# Patient Record
Sex: Female | Born: 1995 | Race: White | Hispanic: No | Marital: Married | Smoking: Never smoker
Health system: Southern US, Community
[De-identification: ages and names within clinical notes are randomized; demographics above are authoritative.]

## PROBLEM LIST (undated history)

## (undated) DIAGNOSIS — R519 Headache, unspecified: Secondary | ICD-10-CM

## (undated) DIAGNOSIS — R51 Headache: Secondary | ICD-10-CM

## (undated) DIAGNOSIS — E079 Disorder of thyroid, unspecified: Secondary | ICD-10-CM

## (undated) HISTORY — DX: Headache, unspecified: R51.9

## (undated) HISTORY — DX: Headache: R51

## (undated) HISTORY — PX: NO PAST SURGERIES: SHX2092

## (undated) HISTORY — DX: Disorder of thyroid, unspecified: E07.9

---

## 2015-01-19 ENCOUNTER — Encounter (HOSPITAL_COMMUNITY): Payer: Self-pay | Admitting: *Deleted

## 2015-01-19 ENCOUNTER — Inpatient Hospital Stay (HOSPITAL_COMMUNITY): Payer: 59

## 2015-01-19 ENCOUNTER — Inpatient Hospital Stay (HOSPITAL_COMMUNITY)
Admission: AD | Admit: 2015-01-19 | Discharge: 2015-01-19 | Disposition: A | Discharge status: Home / Self Care | Payer: 59 | Source: Ambulatory Visit | Attending: Obstetrics and Gynecology | Admitting: Obstetrics and Gynecology

## 2015-01-19 DIAGNOSIS — O209 Hemorrhage in early pregnancy, unspecified: Secondary | ICD-10-CM | POA: Diagnosis present

## 2015-01-19 DIAGNOSIS — Z3A01 Less than 8 weeks gestation of pregnancy: Secondary | ICD-10-CM | POA: Insufficient documentation

## 2015-01-19 DIAGNOSIS — O3680X0 Pregnancy with inconclusive fetal viability, not applicable or unspecified: Secondary | ICD-10-CM

## 2015-01-19 LAB — CBC
HCT: 37.5 % (ref 36.0–46.0)
Hemoglobin: 12.6 g/dL (ref 12.0–15.0)
MCH: 29.7 pg (ref 26.0–34.0)
MCHC: 33.6 g/dL (ref 30.0–36.0)
MCV: 88.4 fL (ref 78.0–100.0)
PLATELETS: 230 10*3/uL (ref 150–400)
RBC: 4.24 MIL/uL (ref 3.87–5.11)
RDW: 13.4 % (ref 11.5–15.5)
WBC: 7.3 10*3/uL (ref 4.0–10.5)

## 2015-01-19 LAB — URINALYSIS, ROUTINE W REFLEX MICROSCOPIC
Bilirubin Urine: NEGATIVE
GLUCOSE, UA: NEGATIVE mg/dL
KETONES UR: NEGATIVE mg/dL
Leukocytes, UA: NEGATIVE
Nitrite: NEGATIVE
PROTEIN: NEGATIVE mg/dL
Specific Gravity, Urine: >1.03 — ABNORMAL HIGH (ref 1.005–1.030)
UROBILINOGEN UA: 0.2 mg/dL (ref 0.0–1.0)
pH: 5.5 (ref 5.0–8.0)

## 2015-01-19 LAB — URINE MICROSCOPIC-ADD ON

## 2015-01-19 LAB — POCT PREGNANCY, URINE: PREG TEST UR: POSITIVE — AB

## 2015-01-19 LAB — WET PREP, GENITAL
Clue Cells Wet Prep HPF POC: NONE SEEN
Trich, Wet Prep: NONE SEEN
Yeast Wet Prep HPF POC: NONE SEEN

## 2015-01-19 LAB — HCG, QUANTITATIVE, PREGNANCY: hCG, Beta Chain, Quant, S: 2503 m[IU]/mL — ABNORMAL HIGH (ref <5)

## 2015-01-19 NOTE — Discharge Instructions (Signed)
Vaginal Bleeding During Pregnancy, First Trimester  A small amount of bleeding (spotting) from the vagina is relatively common in early pregnancy. It usually stops on its own. Various things may cause bleeding or spotting in early pregnancy. Some bleeding may be related to the pregnancy, and some may not. In most cases, the bleeding is normal and is not a problem. However, bleeding can also be a sign of something serious. Be sure to tell your health care provider about any vaginal bleeding right away.  Some possible causes of vaginal bleeding during the first trimester include:  · Infection or inflammation of the cervix.  · Growths (polyps) on the cervix.  · Miscarriage or threatened miscarriage.  · Pregnancy tissue has developed outside of the uterus and in a fallopian tube (tubal pregnancy).  · Tiny cysts have developed in the uterus instead of pregnancy tissue (molar pregnancy).  HOME CARE INSTRUCTIONS   Watch your condition for any changes. The following actions may help to lessen any discomfort you are feeling:  · Follow your health care provider's instructions for limiting your activity. If your health care provider orders bed rest, you may need to stay in bed and only get up to use the bathroom. However, your health care provider may allow you to continue light activity.  · If needed, make plans for someone to help with your regular activities and responsibilities while you are on bed rest.  · Keep track of the number of pads you use each day, how often you change pads, and how soaked (saturated) they are. Write this down.  · Do not use tampons. Do not douche.  · Do not have sexual intercourse or orgasms until approved by your health care provider.  · If you pass any tissue from your vagina, save the tissue so you can show it to your health care provider.  · Only take over-the-counter or prescription medicines as directed by your health care provider.  · Do not take aspirin because it can make you  bleed.  · Keep all follow-up appointments as directed by your health care provider.  SEEK MEDICAL CARE IF:  · You have any vaginal bleeding during any part of your pregnancy.  · You have cramps or labor pains.  · You have a fever, not controlled by medicine.  SEEK IMMEDIATE MEDICAL CARE IF:   · You have severe cramps in your back or belly (abdomen).  · You pass large clots or tissue from your vagina.  · Your bleeding increases.  · You feel light-headed or weak, or you have fainting episodes.  · You have chills.  · You are leaking fluid or have a gush of fluid from your vagina.  · You pass out while having a bowel movement.  MAKE SURE YOU:  · Understand these instructions.  · Will watch your condition.  · Will get help right away if you are not doing well or get worse.  Document Released: 06/13/2005 Document Revised: 09/08/2013 Document Reviewed: 05/11/2013  ExitCare® Patient Information ©2015 [employer]. This information is not intended to replace advice given to you by your health care provider. Make sure you discuss any questions you have with your health care provider.

## 2015-01-19 NOTE — MAU Note (Signed)
Pt stateses she has had not problems with this pregnancy.Pt states she had  Some cramping and went to the bathroom and passed a "big clump of blood" pt states she still sees some blood when se wipes.Pt states blood is very faint

## 2015-01-19 NOTE — MAU Provider Note (Signed)
Chief Complaint: Vaginal Bleeding   First Provider Initiated Contact with Patient 01/19/15 1526      SUBJECTIVE HPI: Danielle Morgan is a 19 y.o. G1P0 at 2566w2d by LMP who presents to maternity admissions reporting onset of bright red vaginal bleeding with small clot when wiping today.  Last intercourse was last night.  She denies abdominal pain.  She has not yet had care in this pregnancy.  Patient's last menstrual period was 12/06/2014.  She is sure of this date and has regular periods.  She denies vaginal itching/burning, urinary symptoms, h/a, dizziness, n/v, or fever/chills.     Vaginal Bleeding The patient's primary symptoms include vaginal bleeding. This is a new problem. The current episode started today. The problem occurs intermittently. The problem has been unchanged. The patient is experiencing no pain. She is pregnant. Associated symptoms include abdominal pain. Pertinent negatives include no back pain, chills, constipation, diarrhea, dysuria, fever, frequency, headaches, nausea, urgency or vomiting. The vaginal bleeding is lighter than menses. She has been passing clots. She has not been passing tissue. Nothing aggravates the symptoms. She has tried nothing for the symptoms. No, her partner does not have an STD. Her menstrual history has been regular.    Past Medical History  Diagnosis Date  . Medical history non-contributory    Past Surgical History  Procedure Laterality Date  . No past surgeries     History   Social History  . Marital Status: Single    Spouse Name: N/A  . Number of Children: N/A  . Years of Education: N/A   Occupational History  . Not on file.   Social History Main Topics  . Smoking status: Not on file  . Smokeless tobacco: Not on file  . Alcohol Use: Not on file  . Drug Use: Not on file  . Sexual Activity: Not on file   Other Topics Concern  . Not on file   Social History Narrative  . No narrative on file   No current facility-administered  medications on file prior to encounter.   No current outpatient prescriptions on file prior to encounter.   No Known Allergies  Review of Systems  Constitutional: Negative for fever, chills and malaise/fatigue.  Eyes: Negative for blurred vision.  Respiratory: Negative for cough and shortness of breath.   Cardiovascular: Negative for chest pain.  Gastrointestinal: Positive for abdominal pain. Negative for heartburn, nausea, vomiting, diarrhea and constipation.  Genitourinary: Positive for vaginal bleeding. Negative for dysuria, urgency and frequency.  Musculoskeletal: Negative.  Negative for back pain.  Neurological: Negative for dizziness and headaches.  Psychiatric/Behavioral: Negative for depression.    OBJECTIVE Blood pressure 128/68, pulse 64, temperature 98.2 F (36.8 C), temperature source Oral, resp. rate 16, last menstrual period 12/06/2014. GENERAL: Well-developed, well-nourished female in no acute distress.  EYES: normal sclera/conjunctiva; no lid-lag HENT: Atraumatic, normocephalic HEART: normal rate RESP: normal effort GI: Soft, non-tender MUSCULOSKELETAL: Normal ROM EXTREMITIES: Nontender, no edema NEURO/PSYCH: Alert and oriented, appropriate affect  GU: Cervix pink, visually closed, without lesion, small amount dark red bleeding, vaginal walls and external genitalia normal Bimanual exam: Cervix 0/long/high, firm, anterior, neg CMT, uterus nontender, nonenlarged, adnexa without tenderness, enlargement, or mass   LAB RESULTS Results for orders placed or performed during the hospital encounter of 01/19/15 (from the past 24 hour(s))  Urinalysis, Routine w reflex microscopic     Status: Abnormal   Collection Time: 01/19/15  2:55 PM  Result Value Ref Range   Color, Urine YELLOW YELLOW  APPearance CLEAR CLEAR   Specific Gravity, Urine >1.030 (H) 1.005 - 1.030   pH 5.5 5.0 - 8.0   Glucose, UA NEGATIVE NEGATIVE mg/dL   Hgb urine dipstick LARGE (A) NEGATIVE    Bilirubin Urine NEGATIVE NEGATIVE   Ketones, ur NEGATIVE NEGATIVE mg/dL   Protein, ur NEGATIVE NEGATIVE mg/dL   Urobilinogen, UA 0.2 0.0 - 1.0 mg/dL   Nitrite NEGATIVE NEGATIVE   Leukocytes, UA NEGATIVE NEGATIVE  Urine microscopic-add on     Status: Abnormal   Collection Time: 01/19/15  2:55 PM  Result Value Ref Range   Squamous Epithelial / LPF FEW (A) RARE   WBC, UA 0-2 <3 WBC/hpf   RBC / HPF 3-6 <3 RBC/hpf  Pregnancy, urine POC     Status: Abnormal   Collection Time: 01/19/15  3:06 PM  Result Value Ref Range   Preg Test, Ur POSITIVE (A) NEGATIVE  Wet prep, genital     Status: Abnormal   Collection Time: 01/19/15  3:24 PM  Result Value Ref Range   Yeast Wet Prep HPF POC NONE SEEN NONE SEEN   Trich, Wet Prep NONE SEEN NONE SEEN   Clue Cells Wet Prep HPF POC NONE SEEN NONE SEEN   WBC, Wet Prep HPF POC FEW (A) NONE SEEN  CBC     Status: None   Collection Time: 01/19/15  3:50 PM  Result Value Ref Range   WBC 7.3 4.0 - 10.5 K/uL   RBC 4.24 3.87 - 5.11 MIL/uL   Hemoglobin 12.6 12.0 - 15.0 g/dL   HCT 16.137.5 09.636.0 - 04.546.0 %   MCV 88.4 78.0 - 100.0 fL   MCH 29.7 26.0 - 34.0 pg   MCHC 33.6 30.0 - 36.0 g/dL   RDW 40.913.4 81.111.5 - 91.415.5 %   Platelets 230 150 - 400 K/uL  hCG, quantitative, pregnancy     Status: Abnormal   Collection Time: 01/19/15  3:50 PM  Result Value Ref Range   hCG, Beta Chain, Quant, S 2503 (H) <5 mIU/mL    IMAGING [place] Ob Comp Less 14 Wks  01/19/2015   CLINICAL DATA:  Vaginal bleeding during first trimester pregnancy. Pelvic pain and cramping. Gestational age by LMP of 6 weeks 2 days  EXAM: OBSTETRIC <14 WK US AND TRANSVAGINAL OB US  TECHNIQUE: Both transabdominal and transvaginal ultrasound examinations were performed for complete evaluation of the gestation as well as the maternal uterus, adnexal regions, and pelvic cul-de-sac. Transvaginal technique was performed to assess early pregnancy.  COMPARISON:  None.  FINDINGS: Intrauterine gestational sac: Visualized/normal in  shape.  Yolk sac:  Not visualized  Embryo:  Not visualized  MSD: 6  mm   5 w   1  d  Maternal uterus/adnexae: A second 3 mm cystic focus is also noted within the endometrial cavity superiorly near the fundus, and a second twin intrauterine gestational sac cannot definitely be excluded.  Both ovaries are normal in appearance. No mass or free fluid identified.  IMPRESSION: Probable early 5 week intrauterine gestational sac, but no yolk sac, fetal pole, or cardiac activity yet visualized. Second smaller 3 mm cystic focus in endometrial cavity noted, and an early twin intrauterine gestational sac cannot definitely be excluded. Recommend follow-up quantitative B-HCG levels and follow-up US in 14 days to assess viability and pregnancy number. This recommendation follows SRU consensus guidelines: Diagnostic Criteria for Nonviable Pregnancy Early in the First Trimester. Malva Limes Engl J Med 2013; 782:9562-13; 369:1443-51.  Normal appearance of both ovaries. No mass or free  fluid identified.   Electronically Signed   By: Myles Rosenthal M.D.   On: 01/19/2015 17:49   US Ob Transvaginal  01/19/2015   CLINICAL DATA:  Vaginal bleeding during first trimester pregnancy. Pelvic pain and cramping. Gestational age by LMP of 6 weeks 2 days  EXAM: OBSTETRIC <14 WK [place] AND TRANSVAGINAL OB US  TECHNIQUE: Both transabdominal and transvaginal ultrasound examinations were performed for complete evaluation of the gestation as well as the maternal uterus, adnexal regions, and pelvic cul-de-sac. Transvaginal technique was performed to assess early pregnancy.  COMPARISON:  None.  FINDINGS: Intrauterine gestational sac: Visualized/normal in shape.  Yolk sac:  Not visualized  Embryo:  Not visualized  MSD: 6  mm   5 w   1  d  Maternal uterus/adnexae: A second 3 mm cystic focus is also noted within the endometrial cavity superiorly near the fundus, and a second twin intrauterine gestational sac cannot definitely be excluded.  Both ovaries are normal in appearance. No mass  or free fluid identified.  IMPRESSION: Probable early 5 week intrauterine gestational sac, but no yolk sac, fetal pole, or cardiac activity yet visualized. Second smaller 3 mm cystic focus in endometrial cavity noted, and an early twin intrauterine gestational sac cannot definitely be excluded. Recommend follow-up quantitative B-HCG levels and follow-up US in 14 days to assess viability and pregnancy number. This recommendation follows SRU consensus guidelines: Diagnostic Criteria for Nonviable Pregnancy Early in the First Trimester. Malva Limes Med 2013; 161:0960-45.  Normal appearance of both ovaries. No mass or free fluid identified.   Electronically Signed   By: Myles Rosenthal M.D.   On: 01/19/2015 17:49   MAU MDM Ordered CBC, quant hcg, wet prep, GCC, and OB U/S. Reviewed lab results, images, and U/S report.  Cannot exclude ectopic pregnancy.  ASSESSMENT 1. Pregnancy of unknown anatomic location   2. Vaginal bleeding in pregnancy, first trimester     PLAN Discharge home with ectopic precautions Return to MAU in 48 hours for repeat quant hcg Return sooner as needed for emergencies    Medication List    TAKE these medications        prenatal multivitamin Tabs tablet  Take 1 tablet by mouth at bedtime.       Follow-up Information    Follow up with [facility] [place] [facility]ERN[facility] ADMISSIONS.   Why:  In 48 hours for repeat labs or sooner as needed   Contact information:   [address] [id number] mc [place] [address] [phone]      Sharen Counter Certified Nurse-Midwife 01/19/2015  8:44 PM

## 2015-01-20 ENCOUNTER — Encounter (HOSPITAL_COMMUNITY): Payer: Self-pay

## 2015-01-20 ENCOUNTER — Encounter: Payer: Self-pay | Admitting: *Deleted

## 2015-01-20 ENCOUNTER — Inpatient Hospital Stay (HOSPITAL_COMMUNITY)
Admission: AD | Admit: 2015-01-20 | Discharge: 2015-01-20 | Disposition: A | Discharge status: Home / Self Care | Payer: 59 | Source: Ambulatory Visit | Attending: Obstetrics & Gynecology | Admitting: Obstetrics & Gynecology

## 2015-01-20 DIAGNOSIS — Z3A01 Less than 8 weeks gestation of pregnancy: Secondary | ICD-10-CM | POA: Insufficient documentation

## 2015-01-20 DIAGNOSIS — O209 Hemorrhage in early pregnancy, unspecified: Secondary | ICD-10-CM | POA: Diagnosis not present

## 2015-01-20 DIAGNOSIS — O039 Complete or unspecified spontaneous abortion without complication: Secondary | ICD-10-CM | POA: Diagnosis not present

## 2015-01-20 LAB — CBC
HEMATOCRIT: 36.4 % (ref 36.0–46.0)
Hemoglobin: 12.5 g/dL (ref 12.0–15.0)
MCH: 30.2 pg (ref 26.0–34.0)
MCHC: 34.3 g/dL (ref 30.0–36.0)
MCV: 87.9 fL (ref 78.0–100.0)
Platelets: 243 10*3/uL (ref 150–400)
RBC: 4.14 MIL/uL (ref 3.87–5.11)
RDW: 13.4 % (ref 11.5–15.5)
WBC: 8.9 10*3/uL (ref 4.0–10.5)

## 2015-01-20 LAB — GC/CHLAMYDIA PROBE AMP (~~LOC~~) NOT AT ARMC
Chlamydia: NEGATIVE
NEISSERIA GONORRHEA: NEGATIVE

## 2015-01-20 LAB — HIV ANTIBODY (ROUTINE TESTING W REFLEX): HIV SCREEN 4TH GENERATION: NONREACTIVE

## 2015-01-20 LAB — HCG, QUANTITATIVE, PREGNANCY: hCG, Beta Chain, Quant, S: 1193 m[IU]/mL — ABNORMAL HIGH (ref <5)

## 2015-01-20 MED ORDER — IBUPROFEN 800 MG PO TABS: 800.0000 mg | ORAL_TABLET | Freq: Three times a day (TID) | ORAL | Status: DC | PRN

## 2015-01-20 NOTE — Discharge Instructions (Signed)
Miscarriage A miscarriage is the sudden loss of an unborn baby (fetus) before the 20th week of pregnancy. Most miscarriages happen in the first 3 months of pregnancy. Sometimes, it happens before a woman even knows she is pregnant. A miscarriage is also called a "spontaneous miscarriage" or "early pregnancy loss." Having a miscarriage can be an emotional experience. Talk with your caregiver about any questions you may have about miscarrying, the grieving process, and your future pregnancy plans. CAUSES   Problems with the fetal chromosomes that make it impossible for the baby to develop normally. Problems with the baby's genes or chromosomes are most often the result of errors that occur, by chance, as the embryo divides and grows. The problems are not inherited from the parents.  Infection of the cervix or uterus.   Hormone problems.   Problems with the cervix, such as having an incompetent cervix. This is when the tissue in the cervix is not strong enough to hold the pregnancy.   Problems with the uterus, such as an abnormally shaped uterus, uterine fibroids, or congenital abnormalities.   Certain medical conditions.   Smoking, drinking alcohol, or taking illegal drugs.   Trauma.  Often, the cause of a miscarriage is unknown.  SYMPTOMS   Vaginal bleeding or spotting, with or without cramps or pain.  Pain or cramping in the abdomen or lower back.  Passing fluid, tissue, or blood clots from the vagina. DIAGNOSIS  Your caregiver will perform a physical exam. You may also have an ultrasound to confirm the miscarriage. Blood or urine tests may also be ordered. TREATMENT   Sometimes, treatment is not necessary if you naturally pass all the fetal tissue that was in the uterus. If some of the fetus or placenta remains in the body (incomplete miscarriage), tissue left behind may become infected and must be removed. Usually, a dilation and curettage (D and C) procedure is performed.  During a D and C procedure, the cervix is widened (dilated) and any remaining fetal or placental tissue is gently removed from the uterus.  Antibiotic medicines are prescribed if there is an infection. Other medicines may be given to reduce the size of the uterus (contract) if there is a lot of bleeding.  If you have Rh negative blood and your baby was Rh positive, you will need a Rh immunoglobulin shot. This shot will protect any future baby from having Rh blood problems in future pregnancies. HOME CARE INSTRUCTIONS   Your caregiver may order bed rest or may allow you to continue light activity. Resume activity as directed by your caregiver.  Have someone help with home and family responsibilities during this time.   Keep track of the number of sanitary pads you use each day and how soaked (saturated) they are. Write down this information.   Do not use tampons. Do not douche or have sexual intercourse until approved by your caregiver.   Only take over-the-counter or prescription medicines for pain or discomfort as directed by your caregiver.   Do not take aspirin. Aspirin can cause bleeding.   Keep all follow-up appointments with your caregiver.   If you or your partner have problems with grieving, talk to your caregiver or seek counseling to help cope with the pregnancy loss. Allow enough time to grieve before trying to get pregnant again.  SEEK IMMEDIATE MEDICAL CARE IF:   You have severe cramps or pain in your back or abdomen.  You have a fever.  You pass large blood clots (walnut-sized   or larger) ortissue from your vagina. Save any tissue for your caregiver to inspect.   Your bleeding increases.   You have a thick, bad-smelling vaginal discharge.  You become lightheaded, weak, or you faint.   You have chills.  MAKE SURE YOU:  Understand these instructions.  Will watch your condition.  Will get help right away if you are not doing well or get  worse. Document Released: 02/27/2001 Document Revised: 12/29/2012 Document Reviewed: 10/23/2011 ExitCare Patient Information 2015 [employer]. This information is not intended to replace advice given to you by your health care provider. Make sure you discuss any questions you have with your health care provider.  

## 2015-01-20 NOTE — MAU Note (Signed)
Pt states she was here yesterday due to bleeding and states everything was okay when she left but today she had another onset of cramping and heavier bleeding.

## 2015-01-20 NOTE — MAU Provider Note (Signed)
History     CSN: [id number]  Arrival date and time: 01/20/15 2039   First Provider Initiated Contact with Patient 01/20/15 2236      No chief complaint on file.  HPI Comments: Danielle Morgan is a 19 y.o. G1P0 at 7749w3d who presents today with vaginal bleeding. She states that she had some bleeding yesterday, but today it became much heavier. She is passing clots. [facility].   Vaginal Bleeding The patient's primary symptoms include vaginal bleeding. This is a new problem. The problem occurs constantly. The problem has been unchanged. The pain is mild (2/10, but the pain comes and goes. Sometimes it is worse ). The problem affects both sides. She is pregnant. Pertinent negatives include no abdominal pain, constipation, diarrhea, dysuria, fever, frequency, nausea, urgency or vomiting. The vaginal discharge was bloody. The vaginal bleeding is typical of menses. She has been passing clots (about the size of a quarter ). She has not been passing tissue.    Past Medical History  Diagnosis Date  . Medical history non-contributory     Past Surgical History  Procedure Laterality Date  . No past surgeries      Family History  Problem Relation Age of Onset  . Cancer Paternal Grandmother     History  Substance Use Topics  . Smoking status: Not on file  . Smokeless tobacco: Not on file  . Alcohol Use: Not on file    Allergies: No Known Allergies  Prescriptions prior to admission  Medication Sig Dispense Refill Last Dose  . Prenatal Vit-Fe Fumarate-FA (PRENATAL MULTIVITAMIN) TABS tablet Take 1 tablet by mouth at bedtime.   01/19/2015 at Unknown time    Review of Systems  Constitutional: Negative for fever.  Gastrointestinal: Negative for nausea, vomiting, abdominal pain, diarrhea and constipation.  Genitourinary: Positive for vaginal bleeding. Negative for dysuria, urgency and frequency.   Physical Exam   Blood  pressure 114/93, pulse 84, temperature 97.7 F (36.5 C), temperature source Oral, resp. rate 18, height 5\' 5"  (1.651 m), weight 70.421 kg (155 lb 4 oz), last menstrual period 12/06/2014, SpO2 97 %.  Physical Exam  Nursing note and vitals reviewed. Constitutional: She is oriented to person, place, and time. She appears well-developed and well-nourished. No distress.  HENT:  Head: Normocephalic.  Cardiovascular: Normal rate.   Respiratory: Effort normal.  GI: Soft. There is no tenderness.  Neurological: She is alert and oriented to person, place, and time.  Skin: Skin is warm and dry.  Psychiatric: She has a normal mood and affect.   Results for orders placed or performed during the hospital encounter of 01/20/15 (from the past 24 hour(s))  CBC     Status: None   Collection Time: 01/20/15  9:10 PM  Result Value Ref Range   WBC 8.9 4.0 - 10.5 K/uL   RBC 4.14 3.87 - 5.11 MIL/uL   Hemoglobin 12.5 12.0 - 15.0 g/dL   HCT 40.936.4 81.136.0 - 91.446.0 %   MCV 87.9 78.0 - 100.0 fL   MCH 30.2 26.0 - 34.0 pg   MCHC 34.3 30.0 - 36.0 g/dL   RDW 78.213.4 95.611.5 - 21.315.5 %   Platelets 243 150 - 400 K/uL  hCG, quantitative, pregnancy     Status: Abnormal   Collection Time: 01/20/15  9:10 PM  Result Value Ref Range   hCG, Beta Chain, Quant, S 1193 (H) <5 mIU/mL  ABO/Rh     Status:  None (Preliminary result)   Collection Time: 01/20/15  9:10 PM  Result Value Ref Range   ABO/RH(D) A POS     MAU Course  Procedures  MDM   Assessment and Plan   1. SAB (spontaneous abortion)    DC home Rx: ibuprofen  Return to MAU as needed FU with [place] as planned  Follow-up Information    [facility].   Specialty:  Obstetrics and Gynecology   Why:  As scheduled   Contact information:   [address] [place] [id number] [phone]       Tawnya CrookHogan, Heather Donovan 01/20/2015, 10:38 PM

## 2015-01-21 LAB — ABO/RH: ABO/RH(D): A POS

## 2015-01-27 ENCOUNTER — Ambulatory Visit: Payer: 59 | Admitting: Obstetrics & Gynecology

## 2015-01-27 ENCOUNTER — Encounter: Payer: Self-pay | Admitting: Obstetrics & Gynecology

## 2015-01-27 ENCOUNTER — Ambulatory Visit (INDEPENDENT_AMBULATORY_CARE_PROVIDER_SITE_OTHER): Payer: 59 | Admitting: Obstetrics & Gynecology

## 2015-01-27 VITALS — BP 102/67 | HR 73 | Wt 159.0 lb

## 2015-01-27 VITALS — BP 102/67 | HR 73 | Ht 65.0 in | Wt 159.0 lb

## 2015-01-27 DIAGNOSIS — O039 Complete or unspecified spontaneous abortion without complication: Secondary | ICD-10-CM

## 2015-01-27 NOTE — Patient Instructions (Signed)
Miscarriage A miscarriage is the sudden loss of an unborn baby (fetus) before the 20th week of pregnancy. Most miscarriages happen in the first 3 months of pregnancy. Sometimes, it happens before a woman even knows she is pregnant. A miscarriage is also called a "spontaneous miscarriage" or "early pregnancy loss." Having a miscarriage can be an emotional experience. Talk with your caregiver about any questions you may have about miscarrying, the grieving process, and your future pregnancy plans. CAUSES   Problems with the fetal chromosomes that make it impossible for the baby to develop normally. Problems with the baby's genes or chromosomes are most often the result of errors that occur, by chance, as the embryo divides and grows. The problems are not inherited from the parents.  Infection of the cervix or uterus.   Hormone problems.   Problems with the cervix, such as having an incompetent cervix. This is when the tissue in the cervix is not strong enough to hold the pregnancy.   Problems with the uterus, such as an abnormally shaped uterus, uterine fibroids, or congenital abnormalities.   Certain medical conditions.   Smoking, drinking alcohol, or taking illegal drugs.   Trauma.  Often, the cause of a miscarriage is unknown.  SYMPTOMS   Vaginal bleeding or spotting, with or without cramps or pain.  Pain or cramping in the abdomen or lower back.  Passing fluid, tissue, or blood clots from the vagina. DIAGNOSIS  Your caregiver will perform a physical exam. You may also have an ultrasound to confirm the miscarriage. Blood or urine tests may also be ordered. TREATMENT   Sometimes, treatment is not necessary if you naturally pass all the fetal tissue that was in the uterus. If some of the fetus or placenta remains in the body (incomplete miscarriage), tissue left behind may become infected and must be removed. Usually, a dilation and curettage (D and C) procedure is performed.  During a D and C procedure, the cervix is widened (dilated) and any remaining fetal or placental tissue is gently removed from the uterus.  Antibiotic medicines are prescribed if there is an infection. Other medicines may be given to reduce the size of the uterus (contract) if there is a lot of bleeding.  If you have Rh negative blood and your baby was Rh positive, you will need a Rh immunoglobulin shot. This shot will protect any future baby from having Rh blood problems in future pregnancies. HOME CARE INSTRUCTIONS   Your caregiver may order bed rest or may allow you to continue light activity. Resume activity as directed by your caregiver.  Have someone help with home and family responsibilities during this time.   Keep track of the number of sanitary pads you use each day and how soaked (saturated) they are. Write down this information.   Do not use tampons. Do not douche or have sexual intercourse until approved by your caregiver.   Only take over-the-counter or prescription medicines for pain or discomfort as directed by your caregiver.   Do not take aspirin. Aspirin can cause bleeding.   Keep all follow-up appointments with your caregiver.   If you or your partner have problems with grieving, talk to your caregiver or seek counseling to help cope with the pregnancy loss. Allow enough time to grieve before trying to get pregnant again.  SEEK IMMEDIATE MEDICAL CARE IF:   You have severe cramps or pain in your back or abdomen.  You have a fever.  You pass large blood clots (walnut-sized   or larger) ortissue from your vagina. Save any tissue for your caregiver to inspect.   Your bleeding increases.   You have a thick, bad-smelling vaginal discharge.  You become lightheaded, weak, or you faint.   You have chills.  MAKE SURE YOU:  Understand these instructions.  Will watch your condition.  Will get help right away if you are not doing well or get  worse. Document Released: 02/27/2001 Document Revised: 12/29/2012 Document Reviewed: 10/23/2011 Exit[employer] Patient Information 2015 [employer]. This information is not intended to replace advice given to you by your health care provider. Make sure you discuss any questions you have with your health care provider.  

## 2015-01-27 NOTE — Progress Notes (Signed)
Subjective:     Patient ID: Danielle Morgan, female   DOB: Jun 28, 1996, 19 y.o.   MRN: [id number]  HPI  Pt s/p bleeding in early pregnancy.  She was seen in the [facility] and had a falling bHCG.  She reports that she continued to have bleeding which has now resolved.  She reports that she and her boyfriend were trying to get pregnant.  She is not interested in contraception.    Review of Systems     Objective:   Physical Exam BP 102/67 mmHg  Pulse 73  Ht 5\' 5"  (1.651 m)  Wt 159 lb (72.122 kg)  BMI 26.46 kg/m2  LMP 12/06/2014  Sono: empty uterus with no GS       Assessment:     S/p SAB- doing well    Plan:     F/u PRN

## 2015-01-28 NOTE — Progress Notes (Signed)
Pt was seen in the ED and dx'd with SAB.  She is no longer bleeding.  She is not interested in contraception.  Angelisse Riso L. Harraway-Smith, M.D., Evern CoreFACOG

## 2015-01-28 NOTE — Patient Instructions (Signed)
Miscarriage A miscarriage is the sudden loss of an unborn baby (fetus) before the 20th week of pregnancy. Most miscarriages happen in the first 3 months of pregnancy. Sometimes, it happens before a woman even knows she is pregnant. A miscarriage is also called a "spontaneous miscarriage" or "early pregnancy loss." Having a miscarriage can be an emotional experience. Talk with your caregiver about any questions you may have about miscarrying, the grieving process, and your future pregnancy plans. CAUSES   Problems with the fetal chromosomes that make it impossible for the baby to develop normally. Problems with the baby's genes or chromosomes are most often the result of errors that occur, by chance, as the embryo divides and grows. The problems are not inherited from the parents.  Infection of the cervix or uterus.   Hormone problems.   Problems with the cervix, such as having an incompetent cervix. This is when the tissue in the cervix is not strong enough to hold the pregnancy.   Problems with the uterus, such as an abnormally shaped uterus, uterine fibroids, or congenital abnormalities.   Certain medical conditions.   Smoking, drinking alcohol, or taking illegal drugs.   Trauma.  Often, the cause of a miscarriage is unknown.  SYMPTOMS   Vaginal bleeding or spotting, with or without cramps or pain.  Pain or cramping in the abdomen or lower back.  Passing fluid, tissue, or blood clots from the vagina. DIAGNOSIS  Your caregiver will perform a physical exam. You may also have an ultrasound to confirm the miscarriage. Blood or urine tests may also be ordered. TREATMENT   Sometimes, treatment is not necessary if you naturally pass all the fetal tissue that was in the uterus. If some of the fetus or placenta remains in the body (incomplete miscarriage), tissue left behind may become infected and must be removed. Usually, a dilation and curettage (D and C) procedure is performed.  During a D and C procedure, the cervix is widened (dilated) and any remaining fetal or placental tissue is gently removed from the uterus.  Antibiotic medicines are prescribed if there is an infection. Other medicines may be given to reduce the size of the uterus (contract) if there is a lot of bleeding.  If you have Rh negative blood and your baby was Rh positive, you will need a Rh immunoglobulin shot. This shot will protect any future baby from having Rh blood problems in future pregnancies. HOME CARE INSTRUCTIONS   Your caregiver may order bed rest or may allow you to continue light activity. Resume activity as directed by your caregiver.  Have someone help with home and family responsibilities during this time.   Keep track of the number of sanitary pads you use each day and how soaked (saturated) they are. Write down this information.   Do not use tampons. Do not douche or have sexual intercourse until approved by your caregiver.   Only take over-the-counter or prescription medicines for pain or discomfort as directed by your caregiver.   Do not take aspirin. Aspirin can cause bleeding.   Keep all follow-up appointments with your caregiver.   If you or your partner have problems with grieving, talk to your caregiver or seek counseling to help cope with the pregnancy loss. Allow enough time to grieve before trying to get pregnant again.  SEEK IMMEDIATE MEDICAL CARE IF:   You have severe cramps or pain in your back or abdomen.  You have a fever.  You pass large blood clots (walnut-sized   or larger) ortissue from your vagina. Save any tissue for your caregiver to inspect.   Your bleeding increases.   You have a thick, bad-smelling vaginal discharge.  You become lightheaded, weak, or you faint.   You have chills.  MAKE SURE YOU:  Understand these instructions.  Will watch your condition.  Will get help right away if you are not doing well or get  worse. Document Released: 02/27/2001 Document Revised: 12/29/2012 Document Reviewed: 10/23/2011 Exit[employer] Patient Information 2015 [employer]. This information is not intended to replace advice given to you by your health care provider. Make sure you discuss any questions you have with your health care provider.  

## 2015-02-10 ENCOUNTER — Encounter: Payer: Self-pay | Admitting: Internal Medicine

## 2015-02-10 ENCOUNTER — Ambulatory Visit (INDEPENDENT_AMBULATORY_CARE_PROVIDER_SITE_OTHER): Payer: 59 | Admitting: Internal Medicine

## 2015-02-10 VITALS — BP 110/66 | HR 79 | Temp 98.5°F | Ht 65.5 in | Wt 157.0 lb

## 2015-02-10 DIAGNOSIS — O039 Complete or unspecified spontaneous abortion without complication: Secondary | ICD-10-CM

## 2015-02-10 DIAGNOSIS — R51 Headache: Secondary | ICD-10-CM | POA: Diagnosis not present

## 2015-02-10 DIAGNOSIS — R519 Headache, unspecified: Secondary | ICD-10-CM

## 2015-02-10 LAB — HCG, QUANTITATIVE, PREGNANCY: Quantitative HCG: 2.04 m[IU]/mL

## 2015-02-10 NOTE — Assessment & Plan Note (Signed)
Hormonal She is not interested in trying OCP as she plans to conceive within the next 6 months. Continue Excedrin Migraine as needed unless you think you may be pregnant

## 2015-02-10 NOTE — Addendum Note (Signed)
Addended by: Roena MaladyEVONTENNO, Almer Littleton Y on: 02/10/2015 05:01 PM   Modules accepted: Orders

## 2015-02-10 NOTE — Progress Notes (Signed)
Pre visit review using our clinic review tool, if applicable. No additional management support is needed unless otherwise documented below in the visit note. 

## 2015-02-10 NOTE — Progress Notes (Signed)
HPI  Pt presents to the clinic today to establish care and for management of the conditions listed below. She is transferring [facility].  Frequent Headaches:  These occur monthly just before her period. They start behind her eyes. She denies blurred vision or dizziness. She has sensitivity to light and sound. She denies nausea or vomiting. She takes Excedrin Migraine with good relief.  She recently had a spontaneous abortion at 6 weeks. She is no longer bleeding or having abdominal cramping. She reports she followed up with [facility], where the did an ultrasound. It did not show any gestational sac or fetal heart beat. She reports they did not recheck her serum HCG. She is not interested in birth control because her and her boyfriend plan to start trying to conceive in the next 6 months.  LMP: 12/06/2014 Pap Smear: Dentist: biannually   Past Medical History  Diagnosis Date  . Frequent headaches     Current Outpatient Prescriptions  Medication Sig Dispense Refill  . Prenatal Vit-Fe Fumarate-FA (PRENATAL MULTIVITAMIN) TABS tablet Take 1 tablet by mouth at bedtime.     No current facility-administered medications for this visit.    No Known Allergies  Family History  Problem Relation Age of Onset  . Cancer Paternal Grandmother     Lung  . Alcohol abuse Paternal Grandmother   . Arthritis Mother   . Bipolar disorder Father   . ADD / ADHD Sister   . ADD / ADHD Brother   . Heart disease Maternal Uncle   . Arthritis Maternal Grandfather   . Hypertension Maternal Grandfather     History   Social History  . Marital Status: Single    Spouse Name: N/A  . Number of Children: N/A  . Years of Education: N/A   Occupational History  . Not on file.   Social History Main Topics  . Smoking status: Never Smoker   . Smokeless tobacco: Never Used  . Alcohol Use: No  . Drug Use: No  . Sexual Activity: Not on file   Other Topics Concern  . Not  on file   Social History Narrative    ROS:  Constitutional: Denies fever, malaise, fatigue, headache or abrupt weight changes.  Respiratory: Denies difficulty breathing, shortness of breath, cough or sputum production.   Cardiovascular: Denies chest pain, chest tightness, palpitations or swelling in the hands or feet.  Gastrointestinal: Denies abdominal pain, bloating, constipation, diarrhea or blood in the stool.  GU: Denies frequency, urgency, pain with urination, blood in urine, odor or discharge. Neurological: Denies dizziness, difficulty with memory, difficulty with speech or problems with balance and coordination.  Psych: Denies anxiety, depression, SI/HI.  No other specific complaints in a complete review of systems (except as listed in HPI above).  PE:  BP 110/66 mmHg  Pulse 79  Temp(Src) 98.5 F (36.9 C) (Oral)  Ht 5' 5.5" (1.664 m)  Wt 157 lb (71.215 kg)  BMI 25.72 kg/m2  SpO2 98%  LMP 12/06/2014 Wt Readings from Last 3 Encounters:  02/10/15 157 lb (71.215 kg) (86 %*, Z = 1.09)  01/27/15 159 lb (72.122 kg) (87 %*, Z = 1.15)  01/27/15 159 lb (72.122 kg) (87 %*, Z = 1.15)   * Growth percentiles are based on CDC 2-20 Years data.    General: Appears her stated age, well developed, well nourished in NAD. HEENT: Head: normal shape and size; Eyes: sclera white, no icterus, conjunctiva pink, PERRLA and EOMs intact;  Cardiovascular: Normal rate and rhythm. S1,S2 noted.  No murmur, rubs or gallops noted. Pulmonary/Chest: Normal effort and positive vesicular breath sounds. No respiratory distress. No wheezes, rales or ronchi noted.  Abdomen: Soft and nontender. Normal bowel sounds, no bruits noted. No distention or masses noted. Liver, spleen and kidneys non palpable. Neurological: Alert and oriented.    Assessment and Plan:  Spontaneous Miscarriage:  Will request ultrasound results from [facility] Will check serum HCG today  RTC in 1 year for  annual exam, sooner if needed

## 2015-02-10 NOTE — Patient Instructions (Signed)
Miscarriage A miscarriage is the sudden loss of an unborn baby (fetus) before the 20th week of pregnancy. Most miscarriages happen in the first 3 months of pregnancy. Sometimes, it happens before a woman even knows she is pregnant. A miscarriage is also called a "spontaneous miscarriage" or "early pregnancy loss." Having a miscarriage can be an emotional experience. Talk with your caregiver about any questions you may have about miscarrying, the grieving process, and your future pregnancy plans. CAUSES   Problems with the fetal chromosomes that make it impossible for the baby to develop normally. Problems with the baby's genes or chromosomes are most often the result of errors that occur, by chance, as the embryo divides and grows. The problems are not inherited from the parents.  Infection of the cervix or uterus.   Hormone problems.   Problems with the cervix, such as having an incompetent cervix. This is when the tissue in the cervix is not strong enough to hold the pregnancy.   Problems with the uterus, such as an abnormally shaped uterus, uterine fibroids, or congenital abnormalities.   Certain medical conditions.   Smoking, drinking alcohol, or taking illegal drugs.   Trauma.  Often, the cause of a miscarriage is unknown.  SYMPTOMS   Vaginal bleeding or spotting, with or without cramps or pain.  Pain or cramping in the abdomen or lower back.  Passing fluid, tissue, or blood clots from the vagina. DIAGNOSIS  Your caregiver will perform a physical exam. You may also have an ultrasound to confirm the miscarriage. Blood or urine tests may also be ordered. TREATMENT   Sometimes, treatment is not necessary if you naturally pass all the fetal tissue that was in the uterus. If some of the fetus or placenta remains in the body (incomplete miscarriage), tissue left behind may become infected and must be removed. Usually, a dilation and curettage (D and C) procedure is performed.  During a D and C procedure, the cervix is widened (dilated) and any remaining fetal or placental tissue is gently removed from the uterus.  Antibiotic medicines are prescribed if there is an infection. Other medicines may be given to reduce the size of the uterus (contract) if there is a lot of bleeding.  If you have Rh negative blood and your baby was Rh positive, you will need a Rh immunoglobulin shot. This shot will protect any future baby from having Rh blood problems in future pregnancies. HOME CARE INSTRUCTIONS   Your caregiver may order bed rest or may allow you to continue light activity. Resume activity as directed by your caregiver.  Have someone help with home and family responsibilities during this time.   Keep track of the number of sanitary pads you use each day and how soaked (saturated) they are. Write down this information.   Do not use tampons. Do not douche or have sexual intercourse until approved by your caregiver.   Only take over-the-counter or prescription medicines for pain or discomfort as directed by your caregiver.   Do not take aspirin. Aspirin can cause bleeding.   Keep all follow-up appointments with your caregiver.   If you or your partner have problems with grieving, talk to your caregiver or seek counseling to help cope with the pregnancy loss. Allow enough time to grieve before trying to get pregnant again.  SEEK IMMEDIATE MEDICAL CARE IF:   You have severe cramps or pain in your back or abdomen.  You have a fever.  You pass large blood clots (walnut-sized   or larger) ortissue from your vagina. Save any tissue for your caregiver to inspect.   Your bleeding increases.   You have a thick, bad-smelling vaginal discharge.  You become lightheaded, weak, or you faint.   You have chills.  MAKE SURE YOU:  Understand these instructions.  Will watch your condition.  Will get help right away if you are not doing well or get  worse. Document Released: 02/27/2001 Document Revised: 12/29/2012 Document Reviewed: 10/23/2011 Exit[employer] Patient Information 2015 [employer]. This information is not intended to replace advice given to you by your health care provider. Make sure you discuss any questions you have with your health care provider.  

## 2015-02-15 ENCOUNTER — Other Ambulatory Visit (INDEPENDENT_AMBULATORY_CARE_PROVIDER_SITE_OTHER): Payer: 59

## 2015-02-15 DIAGNOSIS — O039 Complete or unspecified spontaneous abortion without complication: Secondary | ICD-10-CM | POA: Diagnosis not present

## 2015-02-15 LAB — HCG, QUANTITATIVE, PREGNANCY: Quantitative HCG: 0.93 m[IU]/mL

## 2015-02-16 ENCOUNTER — Telehealth: Payer: Self-pay

## 2015-02-16 NOTE — Telephone Encounter (Signed)
Opened in error

## 2015-04-26 ENCOUNTER — Encounter: Payer: Self-pay | Admitting: Obstetrics & Gynecology

## 2015-04-26 ENCOUNTER — Ambulatory Visit (INDEPENDENT_AMBULATORY_CARE_PROVIDER_SITE_OTHER): Payer: BLUE CROSS/BLUE SHIELD | Admitting: Obstetrics & Gynecology

## 2015-04-26 VITALS — BP 129/82 | HR 84 | Wt 162.0 lb

## 2015-04-26 DIAGNOSIS — Z3402 Encounter for supervision of normal first pregnancy, second trimester: Secondary | ICD-10-CM

## 2015-04-26 DIAGNOSIS — Z3480 Encounter for supervision of other normal pregnancy, unspecified trimester: Secondary | ICD-10-CM | POA: Insufficient documentation

## 2015-04-26 DIAGNOSIS — Z3403 Encounter for supervision of normal first pregnancy, third trimester: Secondary | ICD-10-CM

## 2015-04-26 NOTE — Assessment & Plan Note (Signed)
BABYSCRIPTS PATIENT: [x ] initial, [ ] 12, [ ] 20, [ ] 28, [ ] 32, [ ] 36, [ ] 38, [ ] 39, [ ] 40 

## 2015-04-26 NOTE — Progress Notes (Signed)
   Subjective:    Danielle Morgan is a G2P0 [redacted]w[redacted]d being seen today for her first obstetrical visit.  Her obstetrical history is significant for none. Patient does intend to breast feed. Pregnancy history fully reviewed.  Patient reports no complaints.  Filed Vitals:   04/26/15 1019  BP: 129/82  Pulse: 84  Weight: 162 lb (73.483 kg)    HISTORY: OB History  Gravida Para Term Preterm AB SAB TAB Ectopic Multiple Living  2             # Outcome Date GA Lbr Len/2nd Weight Sex Delivery Anes PTL Lv  2 Current           1 Gravida              Past Medical History  Diagnosis Date  . Frequent headaches    Past Surgical History  Procedure Laterality Date  . No past surgeries     Family History  Problem Relation Age of Onset  . Cancer Paternal Grandmother     Lung  . Arthritis Mother   . Bipolar disorder Father   . ADD / ADHD Sister   . ADD / ADHD Brother   . Heart disease Maternal Uncle   . Arthritis Maternal Grandfather   . Hypertension Maternal Grandfather   . Alcohol abuse Maternal Grandfather      Exam    Uterus:     Pelvic Exam:    Perineum: No Hemorrhoids   Vulva: normal   Vagina:  normal mucosa   pH:    Cervix: anteverted   Adnexa: normal adnexa   Bony Pelvis: android  System: Breast:  normal appearance, no masses or tenderness   Skin: normal coloration and turgor, no rashes    Neurologic: oriented   Extremities: normal strength, tone, and muscle mass   HEENT PERRLA   Mouth/Teeth mucous membranes moist, pharynx normal without lesions   Neck supple   Cardiovascular: regular rate and rhythm   Respiratory:  appears well, vitals normal, no respiratory distress, acyanotic, normal RR, ear and throat exam is normal, neck free of mass or lymphadenopathy, chest clear, no wheezing, crepitations, rhonchi, normal symmetric air entry   Abdomen: soft, non-tender; bowel sounds normal; no masses,  no organomegaly   Urinary: urethral meatus normal      Assessment:    Pregnancy: G2P0 Patient Active Problem List   Diagnosis Date Noted  . Encounter for supervision of normal first pregnancy in second trimester 04/26/2015  . Frequent headaches 02/10/2015        Plan:     Initial labs drawn. Prenatal vitamins. Problem list reviewed and updated. Genetic Screening discussed Quad Screen: undecided.  Ultrasound discussed; fetal survey: requested. Offered Baby Scripts Alexyss Balzarini C. 04/26/2015

## 2015-04-26 NOTE — Progress Notes (Signed)
C/O periodic cramping on and off.

## 2015-04-27 LAB — PRENATAL PROFILE (SOLSTAS)
ANTIBODY SCREEN: NEGATIVE
BASOS PCT: 0 % (ref 0–1)
Basophils Absolute: 0 10*3/uL (ref 0.0–0.1)
EOS ABS: 0.1 10*3/uL (ref 0.0–0.7)
Eosinophils Relative: 1 % (ref 0–5)
HCT: 37.5 % (ref 36.0–46.0)
HEP B S AG: NEGATIVE
HIV 1&2 Ab, 4th Generation: NONREACTIVE
Hemoglobin: 12.6 g/dL (ref 12.0–15.0)
Lymphocytes Relative: 15 % (ref 12–46)
Lymphs Abs: 1.2 10*3/uL (ref 0.7–4.0)
MCH: 30.2 pg (ref 26.0–34.0)
MCHC: 33.6 g/dL (ref 30.0–36.0)
MCV: 89.9 fL (ref 78.0–100.0)
MPV: 10.1 fL (ref 8.6–12.4)
Monocytes Absolute: 0.9 10*3/uL (ref 0.1–1.0)
Monocytes Relative: 12 % (ref 3–12)
NEUTROS PCT: 72 % (ref 43–77)
Neutro Abs: 5.7 10*3/uL (ref 1.7–7.7)
Platelets: 231 10*3/uL (ref 150–400)
RBC: 4.17 MIL/uL (ref 3.87–5.11)
RDW: 14 % (ref 11.5–15.5)
RH TYPE: POSITIVE
Rubella: 1.95 Index — ABNORMAL HIGH (ref <0.90)
WBC: 7.9 10*3/uL (ref 4.0–10.5)

## 2015-04-27 LAB — GC/CHLAMYDIA PROBE AMP, URINE
CHLAMYDIA, SWAB/URINE, PCR: NEGATIVE
GC Probe Amp, Urine: NEGATIVE

## 2015-04-28 ENCOUNTER — Ambulatory Visit (INDEPENDENT_AMBULATORY_CARE_PROVIDER_SITE_OTHER): Payer: BLUE CROSS/BLUE SHIELD | Admitting: Internal Medicine

## 2015-04-28 ENCOUNTER — Encounter: Payer: Self-pay | Admitting: Internal Medicine

## 2015-04-28 VITALS — BP 110/70 | HR 59 | Wt 160.0 lb

## 2015-04-28 DIAGNOSIS — R519 Headache, unspecified: Secondary | ICD-10-CM

## 2015-04-28 DIAGNOSIS — R51 Headache: Secondary | ICD-10-CM | POA: Diagnosis not present

## 2015-04-28 LAB — CULTURE, OB URINE
COLONY COUNT: NO GROWTH
Organism ID, Bacteria: NO GROWTH

## 2015-04-28 MED ORDER — ONDANSETRON HCL 4 MG PO TABS: 4.0000 mg | ORAL_TABLET | Freq: Three times a day (TID) | ORAL | Status: DC | PRN

## 2015-04-28 MED ORDER — BUTALBITAL-APAP-CAFFEINE 50-325-40 MG PO TABS: 1.0000 | ORAL_TABLET | Freq: Every day | ORAL | Status: DC | PRN

## 2015-04-28 NOTE — Patient Instructions (Signed)

## 2015-04-28 NOTE — Progress Notes (Signed)
Pre visit review using our clinic review tool, if applicable. No additional management support is needed unless otherwise documented below in the visit note. 

## 2015-04-28 NOTE — Progress Notes (Signed)
Subjective:    Patient ID: Danielle Morgan, female    DOB: 07-May-1996, 19 y.o.   MRN: [id number]  HPI  Pt presents to the clinic today with c/o frequent headaches. This has been an ongoing issue. They used to occur just before her period. Now, since she became pregnant, they are occuring more frequently. They usually occur in her forehead. She describes the pain as throbbing. She has associated nausea, intermittent vomiting, sensitivity to light and sound. She also has intermittent lightheadedness. She has taken Tylenol 500 mg without much relief.  Review of Systems      Past Medical History  Diagnosis Date  . Frequent headaches     Current Outpatient Prescriptions  Medication Sig Dispense Refill  . Prenatal Vit-Fe Fumarate-FA (PRENATAL MULTIVITAMIN) TABS tablet Take 1 tablet by mouth at bedtime.     No current facility-administered medications for this visit.    No Known Allergies  Family History  Problem Relation Age of Onset  . Cancer Paternal Grandmother     Lung  . Arthritis Mother   . Bipolar disorder Father   . ADD / ADHD Sister   . ADD / ADHD Brother   . Heart disease Maternal Uncle   . Arthritis Maternal Grandfather   . Hypertension Maternal Grandfather   . Alcohol abuse Maternal Grandfather     Social History   Social History  . Marital Status: Single    Spouse Name: N/A  . Number of Children: N/A  . Years of Education: N/A   Occupational History  . Not on file.   Social History Main Topics  . Smoking status: Never Smoker   . Smokeless tobacco: Never Used  . Alcohol Use: No  . Drug Use: No  . Sexual Activity: Yes    Birth Control/ Protection: None   Other Topics Concern  . Not on file   Social History Narrative     Constitutional: Pt reports headache. Denies fever, malaise, fatigue, or abrupt weight changes.  HEENT: Denies eye pain, eye redness, ear pain, ringing in the ears, wax buildup, runny nose, nasal congestion, bloody nose, or sore  throat. Respiratory: Denies difficulty breathing, shortness of breath, cough or sputum production.   Cardiovascular: Denies chest pain, chest tightness, palpitations or swelling in the hands or feet.  Gastrointestinal: Denies abdominal pain, bloating, constipation, diarrhea or blood in the stool.  Neurological: Denies dizziness, difficulty with memory, difficulty with speech or problems with balance and coordination.   No other specific complaints in a complete review of systems (except as listed in HPI above).  Objective:   Physical Exam   BP 110/70 mmHg  Pulse 59  Wt 160 lb (72.576 kg)  SpO2 99%  LMP 02/21/2015 (Exact Date)  Wt Readings from Last 3 Encounters:  04/28/15 160 lb (72.576 kg) (88 %*, Z = 1.15)  04/26/15 162 lb (73.483 kg) (89 %*, Z = 1.21)  02/10/15 157 lb (71.215 kg) (86 %*, Z = 1.09)   * Growth percentiles are based on CDC 2-20 Years data.    General: Appears her stated age, well developed, well nourished in NAD. HEENT: Head: normal shape and size; Eyes: sclera white, no icterus, conjunctiva pink, PERRLA and EOMs intact;  Cardiovascular: Normal rate and rhythm. S1,S2 noted.  No murmur, rubs or gallops noted.  Pulmonary/Chest: Normal effort and positive vesicular breath sounds. No respiratory distress. No wheezes, rales or ronchi noted.  Abdomen: Soft and nontender. Normal bowel sounds, no bruits noted.  Neurological: Alert and  oriented. Coordination normal.       Assessment & Plan:   Migraines:  Discussed treatment of this during pregnancy She will take Tylenol 1000 mg at onset of pregnancy eRx for Fioricet for severe pain, discussed possibility of infant withdrawal at birth if she takes on a regular basis eRx for Zofran 4 mg to use prn for nausea  RTC as needed or if symptoms persist or worsen

## 2015-05-05 ENCOUNTER — Telehealth: Payer: Self-pay | Admitting: *Deleted

## 2015-05-05 DIAGNOSIS — O23591 Infection of other part of genital tract in pregnancy, first trimester: Principal | ICD-10-CM

## 2015-05-05 DIAGNOSIS — N760 Acute vaginitis: Secondary | ICD-10-CM

## 2015-05-05 MED ORDER — FLUCONAZOLE 150 MG PO TABS: 150.0000 mg | ORAL_TABLET | Freq: Once | ORAL | Status: DC

## 2015-05-05 NOTE — Telephone Encounter (Signed)
Pt c/o discharge with no odor and vaginal burning that worsens after sexual intercourse.  Sent rx for Diflucan to pharmacy.  Informed pt that after treatment if she continues to have problems she will need to make an appt to see the physician. Pt acknowledged instructions.

## 2015-05-24 ENCOUNTER — Encounter: Payer: BLUE CROSS/BLUE SHIELD | Admitting: Obstetrics & Gynecology

## 2015-05-31 ENCOUNTER — Ambulatory Visit (INDEPENDENT_AMBULATORY_CARE_PROVIDER_SITE_OTHER): Payer: BLUE CROSS/BLUE SHIELD | Admitting: Family Medicine

## 2015-05-31 ENCOUNTER — Encounter: Payer: Self-pay | Admitting: Family Medicine

## 2015-05-31 VITALS — BP 117/77 | HR 65 | Wt 163.0 lb

## 2015-05-31 DIAGNOSIS — Z3402 Encounter for supervision of normal first pregnancy, second trimester: Secondary | ICD-10-CM

## 2015-05-31 MED ORDER — CONCEPT OB 130-92.4-1 MG PO CAPS: 1.0000 | ORAL_CAPSULE | Freq: Every day | ORAL | Status: DC

## 2015-05-31 NOTE — Progress Notes (Signed)
Subjective:  Danielle Morgan is a 19 y.o. G2P0 at [redacted]w[redacted]d being seen today for ongoing prenatal care.  Patient reports no complaints.  Contractions: Not present.  Vag. Bleeding: None. Movement: Absent. Denies leaking of fluid.   The following portions of the patient's history were reviewed and updated as appropriate: allergies, current medications, past family history, past medical history, past social history, past surgical history and problem list.   Objective:   Filed Vitals:   05/31/15 0944  BP: 117/77  Pulse: 65  Weight: 163 lb (73.936 kg)    Fetal Status: Fetal Heart Rate (bpm): 153   Movement: Absent     General:  Alert, oriented and cooperative. Patient is in no acute distress.  Skin: Skin is warm and dry. No rash noted.   Cardiovascular: Normal heart rate noted  Respiratory: Normal respiratory effort, no problems with respiration noted  Abdomen: Soft, gravid, appropriate for gestational age. Pain/Pressure: Absent     Pelvic: Vag. Bleeding: None Vag D/C Character: Thin    Extremities: Normal range of motion.  Edema: None  Mental Status: Normal mood and affect. Normal behavior. Normal judgment and thought content.   Urinalysis: Urine Protein: Negative Urine Glucose: Negative  Assessment and Plan:  Pregnancy: G2P0 at [redacted]w[redacted]d  1. Encounter for supervision of normal first pregnancy in second trimester Continue routine prenatal care.  - Prenat w/o A Vit-FeFum-FePo-FA (CONCEPT OB) 130-92.4-1 MG CAPS; Take 1 capsule by mouth daily.  Dispense: 30 capsule; Refill: 11  Preterm labor symptoms and general obstetric precautions including but not limited to vaginal bleeding, contractions, leaking of fluid and fetal movement were reviewed in detail with the patient. Please refer to After Visit Summary for other counseling recommendations.  Return in 6 weeks (on 07/12/2015).   Reva Bores, MD

## 2015-05-31 NOTE — Patient Instructions (Signed)
Second Trimester of Pregnancy The second trimester is from week 13 through week 28, months 4 through 6. The second trimester is often a time when you feel your best. Your body has also adjusted to being pregnant, and you begin to feel better physically. Usually, morning sickness has lessened or quit completely, you may have more energy, and you may have an increase in appetite. The second trimester is also a time when the fetus is growing rapidly. At the end of the sixth month, the fetus is about 9 inches long and weighs about 1 pounds. You will likely begin to feel the baby move (quickening) between 18 and 20 weeks of the pregnancy. BODY CHANGES Your body goes through many changes during pregnancy. The changes vary from woman to woman.   Your weight will continue to increase. You will notice your lower abdomen bulging out.  You may begin to get stretch marks on your hips, abdomen, and breasts.  You may develop headaches that can be relieved by medicines approved by your health care provider.  You may urinate more often because the fetus is pressing on your bladder.  You may develop or continue to have heartburn as a result of your pregnancy.  You may develop constipation because certain hormones are causing the muscles that push waste through your intestines to slow down.  You may develop hemorrhoids or swollen, bulging veins (varicose veins).  You may have back pain because of the weight gain and pregnancy hormones relaxing your joints between the bones in your pelvis and as a result of a shift in weight and the muscles that support your balance.  Your breasts will continue to grow and be tender.  Your gums may bleed and may be sensitive to brushing and flossing.  Dark spots or blotches (chloasma, mask of pregnancy) may develop on your face. This will likely fade after the baby is born.  A dark line from your belly button to the pubic area (linea nigra) may appear. This will likely  fade after the baby is born.  You may have changes in your hair. These can include thickening of your hair, rapid growth, and changes in texture. Some women also have hair loss during or after pregnancy, or hair that feels dry or thin. Your hair will most likely return to normal after your baby is born. WHAT TO EXPECT AT YOUR PRENATAL VISITS During a routine prenatal visit:  You will be weighed to make sure you and the fetus are growing normally.  Your blood pressure will be taken.  Your abdomen will be measured to track your baby's growth.  The fetal heartbeat will be listened to.  Any test results from the previous visit will be discussed. Your health care provider may ask you:  How you are feeling.  If you are feeling the baby move.  If you have had any abnormal symptoms, such as leaking fluid, bleeding, severe headaches, or abdominal cramping.  If you have any questions. Other tests that may be performed during your second trimester include:  Blood tests that check for:  Low iron levels (anemia).  Gestational diabetes (between 24 and 28 weeks).  Rh antibodies.  Urine tests to check for infections, diabetes, or protein in the urine.  An ultrasound to confirm the proper growth and development of the baby.  An amniocentesis to check for possible genetic problems.  Fetal screens for spina bifida and Down syndrome. HOME CARE INSTRUCTIONS   Avoid all smoking, herbs, alcohol, and unprescribed   drugs. These chemicals affect the formation and growth of the baby.  Follow your health care provider's instructions regarding medicine use. There are medicines that are either safe or unsafe to take during pregnancy.  Exercise only as directed by your health care provider. Experiencing uterine cramps is a good sign to stop exercising.  Continue to eat regular, healthy meals.  Wear a good support bra for breast tenderness.  Do not use hot tubs, steam rooms, or saunas.  Wear  your seat belt at all times when driving.  Avoid raw meat, uncooked cheese, cat litter boxes, and soil used by cats. These carry germs that can cause birth defects in the baby.  Take your prenatal vitamins.  Try taking a stool softener (if your health care provider approves) if you develop constipation. Eat more high-fiber foods, such as fresh vegetables or fruit and whole grains. Drink plenty of fluids to keep your urine clear or pale yellow.  Take warm sitz baths to soothe any pain or discomfort caused by hemorrhoids. Use hemorrhoid cream if your health care provider approves.  If you develop varicose veins, wear support hose. Elevate your feet for 15 minutes, 3-4 times a day. Limit salt in your diet.  Avoid heavy lifting, wear low heel shoes, and practice good posture.  Rest with your legs elevated if you have leg cramps or low back pain.  Visit your dentist if you have not gone yet during your pregnancy. Use a soft toothbrush to brush your teeth and be gentle when you floss.  A sexual relationship may be continued unless your health care provider directs you otherwise.  Continue to go to all your prenatal visits as directed by your health care provider. SEEK MEDICAL CARE IF:   You have dizziness.  You have mild pelvic cramps, pelvic pressure, or nagging pain in the abdominal area.  You have persistent nausea, vomiting, or diarrhea.  You have a bad smelling vaginal discharge.  You have pain with urination. SEEK IMMEDIATE MEDICAL CARE IF:   You have a fever.  You are leaking fluid from your vagina.  You have spotting or bleeding from your vagina.  You have severe abdominal cramping or pain.  You have rapid weight gain or loss.  You have shortness of breath with chest pain.  You notice sudden or extreme swelling of your face, hands, ankles, feet, or legs.  You have not felt your baby move in over an hour.  You have severe headaches that do not go away with  medicine.  You have vision changes. Document Released: 08/28/2001 Document Revised: 09/08/2013 Document Reviewed: 11/04/2012 [employer] Patient Information 2015 [employer]. This information is not intended to replace advice given to you by your health care provider. Make sure you discuss any questions you have with your health care provider.  Breastfeeding Deciding to breastfeed is one of the best choices you can make for you and your baby. A change in hormones during pregnancy causes your breast tissue to grow and increases the number and size of your milk ducts. These hormones also allow proteins, sugars, and fats from your blood supply to make breast milk in your milk-producing glands. Hormones prevent breast milk from being released before your baby is born as well as prompt milk flow after birth. Once breastfeeding has begun, thoughts of your baby, as well as his or her sucking or crying, can stimulate the release of milk from your milk-producing glands.  BENEFITS OF BREASTFEEDING For Your Baby  Your first   milk (colostrum) helps your baby's digestive system function better.   There are antibodies in your milk that help your baby fight off infections.   Your baby has a lower incidence of asthma, allergies, and sudden infant death syndrome.   The nutrients in breast milk are better for your baby than infant formulas and are designed uniquely for your baby's needs.   Breast milk improves your baby's brain development.   Your baby is less likely to develop other conditions, such as childhood obesity, asthma, or type 2 diabetes mellitus.  For You   Breastfeeding helps to create a very special bond between you and your baby.   Breastfeeding is convenient. Breast milk is always available at the correct temperature and costs nothing.   Breastfeeding helps to burn calories and helps you lose the weight gained during pregnancy.   Breastfeeding makes your uterus contract to its  prepregnancy size faster and slows bleeding (lochia) after you give birth.   Breastfeeding helps to lower your risk of developing type 2 diabetes mellitus, osteoporosis, and breast or ovarian cancer later in life. SIGNS THAT YOUR BABY IS HUNGRY Early Signs of Hunger  Increased alertness or activity.  Stretching.  Movement of the head from side to side.  Movement of the head and opening of the mouth when the corner of the mouth or cheek is stroked (rooting).  Increased sucking sounds, smacking lips, cooing, sighing, or squeaking.  Hand-to-mouth movements.  Increased sucking of fingers or hands. Late Signs of Hunger  Fussing.  Intermittent crying. Extreme Signs of Hunger Signs of extreme hunger will require calming and consoling before your baby will be able to breastfeed successfully. Do not wait for the following signs of extreme hunger to occur before you initiate breastfeeding:   Restlessness.  A loud, strong cry.   Screaming. BREASTFEEDING BASICS Breastfeeding Initiation  Find a comfortable place to sit or lie down, with your neck and back well supported.  Place a pillow or rolled up blanket under your baby to bring him or her to the level of your breast (if you are seated). Nursing pillows are specially designed to help support your arms and your baby while you breastfeed.  Make sure that your baby's abdomen is facing your abdomen.   Gently massage your breast. With your fingertips, massage from your chest wall toward your nipple in a circular motion. This encourages milk flow. You may need to continue this action during the feeding if your milk flows slowly.  Support your breast with 4 fingers underneath and your thumb above your nipple. Make sure your fingers are well away from your nipple and your baby's mouth.   Stroke your baby's lips gently with your finger or nipple.   When your baby's mouth is open wide enough, quickly bring your baby to your breast,  placing your entire nipple and as much of the colored area around your nipple (areola) as possible into your baby's mouth.   More areola should be visible above your baby's upper lip than below the lower lip.   Your baby's tongue should be between his or her lower gum and your breast.   Ensure that your baby's mouth is correctly positioned around your nipple (latched). Your baby's lips should create a seal on your breast and be turned out (everted).  It is common for your baby to suck about 2-3 minutes in order to start the flow of breast milk. Latching Teaching your baby how to latch on to your breast   properly is very important. An improper latch can cause nipple pain and decreased milk supply for you and poor weight gain in your baby. Also, if your baby is not latched onto your nipple properly, he or she may swallow some air during feeding. This can make your baby fussy. Burping your baby when you switch breasts during the feeding can help to get rid of the air. However, teaching your baby to latch on properly is still the best way to prevent fussiness from swallowing air while breastfeeding. Signs that your baby has successfully latched on to your nipple:    Silent tugging or silent sucking, without causing you pain.   Swallowing heard between every 3-4 sucks.    Muscle movement above and in front of his or her ears while sucking.  Signs that your baby has not successfully latched on to nipple:   Sucking sounds or smacking sounds from your baby while breastfeeding.  Nipple pain. If you think your baby has not latched on correctly, slip your finger into the corner of your baby's mouth to break the suction and place it between your baby's gums. Attempt breastfeeding initiation again. Signs of Successful Breastfeeding Signs from your baby:   A gradual decrease in the number of sucks or complete cessation of sucking.   Falling asleep.   Relaxation of his or her body.    Retention of a small amount of milk in his or her mouth.   Letting go of your breast by himself or herself. Signs from you:  Breasts that have increased in firmness, weight, and size 1-3 hours after feeding.   Breasts that are softer immediately after breastfeeding.  Increased milk volume, as well as a change in milk consistency and color by the fifth day of breastfeeding.   Nipples that are not sore, cracked, or bleeding. Signs That Your Baby is Getting Enough Milk  Wetting at least 3 diapers in a 24-hour period. The urine should be clear and pale yellow by age 5 days.  At least 3 stools in a 24-hour period by age 5 days. The stool should be soft and yellow.  At least 3 stools in a 24-hour period by age 7 days. The stool should be seedy and yellow.  No loss of weight greater than 10% of birth weight during the first 3 days of age.  Average weight gain of 4-7 ounces (113-198 g) per week after age 4 days.  Consistent daily weight gain by age 5 days, without weight loss after the age of 2 weeks. After a feeding, your baby may spit up a small amount. This is common. BREASTFEEDING FREQUENCY AND DURATION Frequent feeding will help you make more milk and can prevent sore nipples and breast engorgement. Breastfeed when you feel the need to reduce the fullness of your breasts or when your baby shows signs of hunger. This is called "breastfeeding on demand." Avoid introducing a pacifier to your baby while you are working to establish breastfeeding (the first 4-6 weeks after your baby is born). After this time you may choose to use a pacifier. Research has shown that pacifier use during the first year of a baby's life decreases the risk of sudden infant death syndrome (SIDS). Allow your baby to feed on each breast as long as he or she wants. Breastfeed until your baby is finished feeding. When your baby unlatches or falls asleep while feeding from the first breast, offer the second breast.  Because newborns are often sleepy in the   first few weeks of life, you may need to awaken your baby to get him or her to feed. Breastfeeding times will vary from baby to baby. However, the following rules can serve as a guide to help you ensure that your baby is properly fed:  Newborns (babies 4 weeks of age or younger) may breastfeed every 1-3 hours.  Newborns should not go longer than 3 hours during the day or 5 hours during the night without breastfeeding.  You should breastfeed your baby a minimum of 8 times in a 24-hour period until you begin to introduce solid foods to your baby at around 6 months of age. BREAST MILK PUMPING Pumping and storing breast milk allows you to ensure that your baby is exclusively fed your breast milk, even at times when you are unable to breastfeed. This is especially important if you are going back to work while you are still breastfeeding or when you are not able to be present during feedings. Your lactation consultant can give you guidelines on how long it is safe to store breast milk.  A breast pump is a machine that allows you to pump milk from your breast into a sterile bottle. The pumped breast milk can then be stored in a refrigerator or freezer. Some breast pumps are operated by hand, while others use electricity. Ask your lactation consultant which type will work best for you. Breast pumps can be purchased, but some hospitals and breastfeeding support groups lease breast pumps on a monthly basis. A lactation consultant can teach you how to hand express breast milk, if you prefer not to use a pump.  CARING FOR YOUR BREASTS WHILE YOU BREASTFEED Nipples can become dry, cracked, and sore while breastfeeding. The following recommendations can help keep your breasts moisturized and healthy:  Avoid using soap on your nipples.   Wear a supportive bra. Although not required, special nursing bras and tank tops are designed to allow access to your breasts for  breastfeeding without taking off your entire bra or top. Avoid wearing underwire-style bras or extremely tight bras.  Air dry your nipples for 3-4minutes after each feeding.   Use only cotton bra pads to absorb leaked breast milk. Leaking of breast milk between feedings is normal.   Use lanolin on your nipples after breastfeeding. Lanolin helps to maintain your skin's normal moisture barrier. If you use pure lanolin, you do not need to wash it off before feeding your baby again. Pure lanolin is not toxic to your baby. You may also hand express a few drops of breast milk and gently massage that milk into your nipples and allow the milk to air dry. In the first few weeks after giving birth, some women experience extremely full breasts (engorgement). Engorgement can make your breasts feel heavy, warm, and tender to the touch. Engorgement peaks within 3-5 days after you give birth. The following recommendations can help ease engorgement:  Completely empty your breasts while breastfeeding or pumping. You may want to start by applying warm, moist heat (in the shower or with warm water-soaked hand towels) just before feeding or pumping. This increases circulation and helps the milk flow. If your baby does not completely empty your breasts while breastfeeding, pump any extra milk after he or she is finished.  Wear a snug bra (nursing or regular) or tank top for 1-2 days to signal your body to slightly decrease milk production.  Apply ice packs to your breasts, unless this is too uncomfortable for you.    Make sure that your baby is latched on and positioned properly while breastfeeding. If engorgement persists after 48 hours of following these recommendations, contact your health care provider or a lactation consultant. OVERALL HEALTH CARE RECOMMENDATIONS WHILE BREASTFEEDING  Eat healthy foods. Alternate between meals and snacks, eating 3 of each per day. Because what you eat affects your breast milk,  some of the foods may make your baby more irritable than usual. Avoid eating these foods if you are sure that they are negatively affecting your baby.  Drink milk, fruit juice, and water to satisfy your thirst (about 10 glasses a day).   Rest often, relax, and continue to take your prenatal vitamins to prevent fatigue, stress, and anemia.  Continue breast self-awareness checks.  Avoid chewing and smoking tobacco.  Avoid alcohol and drug use. Some medicines that may be harmful to your baby can pass through breast milk. It is important to ask your health care provider before taking any medicine, including all over-the-counter and prescription medicine as well as vitamin and herbal supplements. It is possible to become pregnant while breastfeeding. If birth control is desired, ask your health care provider about options that will be safe for your baby. SEEK MEDICAL CARE IF:   You feel like you want to stop breastfeeding or have become frustrated with breastfeeding.  You have painful breasts or nipples.  Your nipples are cracked or bleeding.  Your breasts are red, tender, or warm.  You have a swollen area on either breast.  You have a fever or chills.  You have nausea or vomiting.  You have drainage other than breast milk from your nipples.  Your breasts do not become full before feedings by the fifth day after you give birth.  You feel sad and depressed.  Your baby is too sleepy to eat well.  Your baby is having trouble sleeping.   Your baby is wetting less than 3 diapers in a 24-hour period.  Your baby has less than 3 stools in a 24-hour period.  Your baby's skin or the white part of his or her eyes becomes yellow.   Your baby is not gaining weight by 5 days of age. SEEK IMMEDIATE MEDICAL CARE IF:   Your baby is overly tired (lethargic) and does not want to wake up and feed.  Your baby develops an unexplained fever. Document Released: 09/03/2005 Document Revised:  09/08/2013 Document Reviewed: 02/25/2013 [employer] Patient Information 2015 [employer]. This information is not intended to replace advice given to you by your health care provider. Make sure you discuss any questions you have with your health care provider.  

## 2015-05-31 NOTE — Progress Notes (Signed)
Offered flu vaccine, pt would like to receive at next visit.

## 2015-06-02 ENCOUNTER — Telehealth: Payer: Self-pay | Admitting: *Deleted

## 2015-06-02 NOTE — Telephone Encounter (Signed)
Pt c/o frequent headaches, has a hx of headaches and takes Fioricet prn, has been using Fioricet and still getting frequent headaches after some relief.  Scheduled appt to see Danielle Morgan on 9-27 for headache consult.

## 2015-06-14 ENCOUNTER — Ambulatory Visit (INDEPENDENT_AMBULATORY_CARE_PROVIDER_SITE_OTHER): Payer: BLUE CROSS/BLUE SHIELD | Admitting: Physician Assistant

## 2015-06-14 ENCOUNTER — Encounter: Payer: Self-pay | Admitting: *Deleted

## 2015-06-14 VITALS — BP 107/72 | HR 93 | Resp 18 | Ht 65.0 in | Wt 163.8 lb

## 2015-06-14 DIAGNOSIS — O26892 Other specified pregnancy related conditions, second trimester: Secondary | ICD-10-CM | POA: Diagnosis not present

## 2015-06-14 DIAGNOSIS — M62838 Other muscle spasm: Secondary | ICD-10-CM | POA: Diagnosis not present

## 2015-06-14 DIAGNOSIS — R51 Headache: Secondary | ICD-10-CM

## 2015-06-14 DIAGNOSIS — G43009 Migraine without aura, not intractable, without status migrainosus: Secondary | ICD-10-CM

## 2015-06-14 DIAGNOSIS — R519 Headache, unspecified: Secondary | ICD-10-CM

## 2015-06-14 MED ORDER — CYCLOBENZAPRINE HCL 10 MG PO TABS: 10.0000 mg | ORAL_TABLET | Freq: Three times a day (TID) | ORAL | Status: DC | PRN

## 2015-06-14 MED ORDER — PROMETHAZINE HCL 25 MG PO TABS: 25.0000 mg | ORAL_TABLET | Freq: Four times a day (QID) | ORAL | Status: DC | PRN

## 2015-06-14 NOTE — Patient Instructions (Signed)

## 2015-06-14 NOTE — Progress Notes (Signed)
Patient ID: Danielle Morgan, female   DOB: Nov 08, 1995, 19 y.o.   MRN: [id number] History:  Danielle Morgan is a 19 y.o. G2P0 who presents to clinic today for first evaluation of headaches.  Headaches become severe when left untreated.  Headache can last >24 hours.  It is located behind one eye = most recently left retro-orbital.  She has pulsating and movement makes the pain worse.  She is sensitive to lights and sounds.  She hasn't noticed smells being a problem.  She has nausea and vomiting.  No vision changes.  No warning of impending headache.  She uses fioricet, rests and places cold cloth over her eyes.  This helps but does not give complete resolution.  HA's often begin in afternoon.   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   Previous medications used include Tylenol and Ibuprofen.  When HA is severe, nothing helps.    HIT6:66 Number of days in the last 4 weeks with:  Severe headache: 6 Moderate headache: 2 Mild headache: 3  No headache: 17   Past Medical History  Diagnosis Date  . Frequent headaches     Social History   Social History  . Marital Status: Single    Spouse Name: N/A  . Number of Children: N/A  . Years of Education: N/A   Occupational History  . Not on file.   Social History Main Topics  . Smoking status: Never Smoker   . Smokeless tobacco: Never Used  . Alcohol Use: No  . Drug Use: No  . Sexual Activity: Yes    Birth Control/ Protection: None   Other Topics Concern  . Not on file   Social History Narrative    Family History  Problem Relation Age of Onset  . Cancer Paternal Grandmother     Lung  . Arthritis Mother   . Bipolar disorder Father   . ADD / ADHD Sister   . ADD / ADHD Brother   . Heart disease Maternal Uncle   . Arthritis Maternal Grandfather   . Hypertension Maternal Grandfather   . Alcohol abuse Maternal Grandfather     No Known Allergies  Current Outpatient Prescriptions on File Prior to Visit  Medication Sig  Dispense Refill  . butalbital-acetaminophen-caffeine (FIORICET, ESGIC) 50-325-40 MG per tablet Take 1 tablet by mouth daily as needed for headache. 20 tablet 0  . ondansetron (ZOFRAN) 4 MG tablet Take 1 tablet (4 mg total) by mouth every 8 (eight) hours as needed. (Patient not taking: Reported on 05/31/2015) 20 tablet 0  . Prenat w/o A Vit-FeFum-FePo-FA (CONCEPT OB) 130-92.4-1 MG CAPS Take 1 capsule by mouth daily. 30 capsule 11  . Prenatal Vit-Fe Fumarate-FA (PRENATAL MULTIVITAMIN) TABS tablet Take 1 tablet by mouth at bedtime.     No current facility-administered medications on file prior to visit.     Review of Systems:  All pertinent positive/negative included in HPI, all other review of systems are negative  Objective:  Physical Exam BP 107/72 mmHg  Pulse 93  Resp 18  Ht  (1.651 m)  Wt 163 lb 12.8 oz (74.299 kg)  BMI 27.26 kg/m2  LMP 02/21/2015 (Exact Date) CONSTITUTIONAL: Well-developed, well-nourished female in no acute distress.  EYES: EOM intact ENT: Normocephalic CARDIOVASCULAR: Regular rate and rhythm with no adventitious sounds.  RESPIRATORY: Normal rate. Clear to auscultation bilaterally.  ENDOCRINE: Normal thyroid.  MUSCULOSKELETAL: Normal ROM, strength equal bilaterally, muscle spasm of trapezius noted bilaterally SKIN: Warm, dry without erythema  NEUROLOGICAL: Alert, oriented, CN II-XII grossly intact, Appropriate balance, No dysmetria, Sensation equal bilaterally, Romberg negative.   PSYCH: Normal behavior, mood   Assessment & Plan:  Assessment: New diagnoses:  Migraine Headache without aura Muscle Spasm  Plan: Due to pregnancy - will avoid prevention of migraine with any medications.  However, lifestyle discussed.  Pt encouraged to increase fluid consumption, eat 5 times per day, get regular mild exercise daily, keep regular sleep schedule, etc.   For acute migraine - pt to use 1/2 flexeril.  Sedation precautions given.   May use max of 3 full tabs per  day.  Pt may also use this for muscle spasm without headache.   If no response to Flexeril for more severe migraine - pt may use phenergan and go to bed.  Pt advised may not operate heavy machinery on this medication.   Follow-up in 1 month or sooner PRN  Danielle Denver, PA-C 06/14/2015 8:58 AM

## 2015-06-28 ENCOUNTER — Telehealth: Payer: Self-pay | Admitting: Physician Assistant

## 2015-06-28 NOTE — Telephone Encounter (Signed)
Pt had called to inquire if use of 1 full tablet of cyclobenzaprine is safe.  Left message: yes.  She may take one whole tablet of  but must be careful not to drive or operate heavy machinery as it can cause sedation.   Call back number left to call with further questions: [phone].

## 2015-06-28 NOTE — Telephone Encounter (Signed)
-----   Message from Gita Kudo, RN sent at 06/28/2015  8:41 AM EDT ----- Regarding: FW: Headache Visit Question Contact: [phone] Please see message below, thanks.  ----- Message -----    From: Olevia Bowens    Sent: 06/27/2015   2:09 PM      To: Gita Kudo, RN Subject: Headache Visit Question                        Danielle Morgan was seen on 9/27, for headaches, was given Flexeril and was told to take 0.5 a tablet, nothing changed wanted to know if it was okay to take a whole tablet.

## 2015-07-06 ENCOUNTER — Ambulatory Visit (HOSPITAL_COMMUNITY)
Admission: RE | Admit: 2015-07-06 | Discharge: 2015-07-06 | Disposition: A | Discharge status: Home / Self Care | Payer: BLUE CROSS/BLUE SHIELD | Source: Ambulatory Visit | Attending: Obstetrics & Gynecology | Admitting: Obstetrics & Gynecology

## 2015-07-06 ENCOUNTER — Other Ambulatory Visit: Payer: Self-pay | Admitting: Obstetrics & Gynecology

## 2015-07-06 DIAGNOSIS — Z3A19 19 weeks gestation of pregnancy: Secondary | ICD-10-CM

## 2015-07-06 DIAGNOSIS — Z3402 Encounter for supervision of normal first pregnancy, second trimester: Secondary | ICD-10-CM | POA: Diagnosis present

## 2015-07-06 DIAGNOSIS — Z3689 Encounter for other specified antenatal screening: Secondary | ICD-10-CM

## 2015-07-12 ENCOUNTER — Ambulatory Visit (INDEPENDENT_AMBULATORY_CARE_PROVIDER_SITE_OTHER): Payer: BLUE CROSS/BLUE SHIELD | Admitting: Family Medicine

## 2015-07-12 VITALS — BP 123/78 | HR 86 | Wt 167.0 lb

## 2015-07-12 DIAGNOSIS — Z3402 Encounter for supervision of normal first pregnancy, second trimester: Secondary | ICD-10-CM

## 2015-07-12 DIAGNOSIS — Z23 Encounter for immunization: Secondary | ICD-10-CM | POA: Diagnosis not present

## 2015-07-12 NOTE — Progress Notes (Signed)
Subjective:  Danielle Morgan is a 19 y.o. G2P0 at 2637w1d being seen today for ongoing prenatal care.  Patient reports no complaints.  Contractions: Not present.  Vag. Bleeding: None. Movement: Present. Denies leaking of fluid.   The following portions of the patient's history were reviewed and updated as appropriate: allergies, current medications, past family history, past medical history, past social history, past surgical history and problem list. Problem list updated.  Objective:   Filed Vitals:   07/12/15 1021  BP: 123/78  Pulse: 86  Weight: 167 lb (75.751 kg)    Fetal Status: Fetal Heart Rate (bpm): 156 Fundal Height: 20 cm Movement: Present     General:  Alert, oriented and cooperative. Patient is in no acute distress.  Skin: Skin is warm and dry. No rash noted.   Cardiovascular: Normal heart rate noted  Respiratory: Normal respiratory effort, no problems with respiration noted  Abdomen: Soft, gravid, appropriate for gestational age. Pain/Pressure: Absent     Pelvic: Vag. Bleeding: None Vag D/C Character: Thin   Cervical exam deferred        Extremities: Normal range of motion.  Edema: None  Mental Status: Normal mood and affect. Normal behavior. Normal judgment and thought content.   Urinalysis: Urine Protein: Negative Urine Glucose: Negative  Assessment and Plan:  Pregnancy: G2P0 at 5337w1d  1. Encounter for supervision of normal first pregnancy in second trimester Continue routine prenatal care. Baby Rx Normal anatomy reviewed - Flu Vaccine QUAD 36+ mos IM (Fluarix, Quad PF)  Preterm labor symptoms and general obstetric precautions including but not limited to vaginal bleeding, contractions, leaking of fluid and fetal movement were reviewed in detail with the patient. Please refer to After Visit Summary for other counseling recommendations.  Return in 8 weeks (on 09/06/2015).   Reva Boresanya S Izaah Westman, MD

## 2015-07-12 NOTE — Patient Instructions (Signed)
Second Trimester of Pregnancy The second trimester is from week 13 through week 28, months 4 through 6. The second trimester is often a time when you feel your best. Your body has also adjusted to being pregnant, and you begin to feel better physically. Usually, morning sickness has lessened or quit completely, you may have more energy, and you may have an increase in appetite. The second trimester is also a time when the fetus is growing rapidly. At the end of the sixth month, the fetus is about 9 inches long and weighs about 1 pounds. You will likely begin to feel the baby move (quickening) between 18 and 20 weeks of the pregnancy. BODY CHANGES Your body goes through many changes during pregnancy. The changes vary from woman to woman.   Your weight will continue to increase. You will notice your lower abdomen bulging out.  You may begin to get stretch marks on your hips, abdomen, and breasts.  You may develop headaches that can be relieved by medicines approved by your health care provider.  You may urinate more often because the fetus is pressing on your bladder.  You may develop or continue to have heartburn as a result of your pregnancy.  You may develop constipation because certain hormones are causing the muscles that push waste through your intestines to slow down.  You may develop hemorrhoids or swollen, bulging veins (varicose veins).  You may have back pain because of the weight gain and pregnancy hormones relaxing your joints between the bones in your pelvis and as a result of a shift in weight and the muscles that support your balance.  Your breasts will continue to grow and be tender.  Your gums may bleed and may be sensitive to brushing and flossing.  Dark spots or blotches (chloasma, mask of pregnancy) may develop on your face. This will likely fade after the baby is born.  A dark line from your belly button to the pubic area (linea nigra) may appear. This will likely  fade after the baby is born.  You may have changes in your hair. These can include thickening of your hair, rapid growth, and changes in texture. Some women also have hair loss during or after pregnancy, or hair that feels dry or thin. Your hair will most likely return to normal after your baby is born. WHAT TO EXPECT AT YOUR PRENATAL VISITS During a routine prenatal visit:  You will be weighed to make sure you and the fetus are growing normally.  Your blood pressure will be taken.  Your abdomen will be measured to track your baby's growth.  The fetal heartbeat will be listened to.  Any test results from the previous visit will be discussed. Your health care provider may ask you:  How you are feeling.  If you are feeling the baby move.  If you have had any abnormal symptoms, such as leaking fluid, bleeding, severe headaches, or abdominal cramping.  If you are using any tobacco products, including cigarettes, chewing tobacco, and electronic cigarettes.  If you have any questions. Other tests that may be performed during your second trimester include:  Blood tests that check for:  Low iron levels (anemia).  Gestational diabetes (between 24 and 28 weeks).  Rh antibodies.  Urine tests to check for infections, diabetes, or protein in the urine.  An ultrasound to confirm the proper growth and development of the baby.  An amniocentesis to check for possible genetic problems.  Fetal screens for spina bifida   and Down syndrome.  HIV (human immunodeficiency virus) testing. Routine prenatal testing includes screening for HIV, unless you choose not to have this test. HOME CARE INSTRUCTIONS   Avoid all smoking, herbs, alcohol, and unprescribed drugs. These chemicals affect the formation and growth of the baby.  Do not use any tobacco products, including cigarettes, chewing tobacco, and electronic cigarettes. If you need help quitting, ask your health care provider. You may receive  counseling support and other resources to help you quit.  Follow your health care provider's instructions regarding medicine use. There are medicines that are either safe or unsafe to take during pregnancy.  Exercise only as directed by your health care provider. Experiencing uterine cramps is a good sign to stop exercising.  Continue to eat regular, healthy meals.  Wear a good support bra for breast tenderness.  Do not use hot tubs, steam rooms, or saunas.  Wear your seat belt at all times when driving.  Avoid raw meat, uncooked cheese, cat litter boxes, and soil used by cats. These carry germs that can cause birth defects in the baby.  Take your prenatal vitamins.  Take 1500-2000 mg of calcium daily starting at the 20th week of pregnancy until you deliver your baby.  Try taking a stool softener (if your health care provider approves) if you develop constipation. Eat more high-fiber foods, such as fresh vegetables or fruit and whole grains. Drink plenty of fluids to keep your urine clear or pale yellow.  Take warm sitz baths to soothe any pain or discomfort caused by hemorrhoids. Use hemorrhoid cream if your health care provider approves.  If you develop varicose veins, wear support hose. Elevate your feet for 15 minutes, 3-4 times a day. Limit salt in your diet.  Avoid heavy lifting, wear low heel shoes, and practice good posture.  Rest with your legs elevated if you have leg cramps or low back pain.  Visit your dentist if you have not gone yet during your pregnancy. Use a soft toothbrush to brush your teeth and be gentle when you floss.  A sexual relationship may be continued unless your health care provider directs you otherwise.  Continue to go to all your prenatal visits as directed by your health care provider. SEEK MEDICAL CARE IF:   You have dizziness.  You have mild pelvic cramps, pelvic pressure, or nagging pain in the abdominal area.  You have persistent nausea,  vomiting, or diarrhea.  You have a bad smelling vaginal discharge.  You have pain with urination. SEEK IMMEDIATE MEDICAL CARE IF:   You have a fever.  You are leaking fluid from your vagina.  You have spotting or bleeding from your vagina.  You have severe abdominal cramping or pain.  You have rapid weight gain or loss.  You have shortness of breath with chest pain.  You notice sudden or extreme swelling of your face, hands, ankles, feet, or legs.  You have not felt your baby move in over an hour.  You have severe headaches that do not go away with medicine.  You have vision changes.   This information is not intended to replace advice given to you by your health care provider. Make sure you discuss any questions you have with your health care provider.   Document Released: 08/28/2001 Document Revised: 09/24/2014 Document Reviewed: 11/04/2012 Elsevier Interactive Patient Education 2016 [employer]   Breastfeeding Deciding to breastfeed is one of the best choices you can make for you and your baby. A change   in hormones during pregnancy causes your breast tissue to grow and increases the number and size of your milk ducts. These hormones also allow proteins, sugars, and fats from your blood supply to make breast milk in your milk-producing glands. Hormones prevent breast milk from being released before your baby is born as well as prompt milk flow after birth. Once breastfeeding has begun, thoughts of your baby, as well as his or her sucking or crying, can stimulate the release of milk from your milk-producing glands.  BENEFITS OF BREASTFEEDING For Your Baby  Your first milk (colostrum) helps your baby's digestive system function better.  There are antibodies in your milk that help your baby fight off infections.  Your baby has a lower incidence of asthma, allergies, and sudden infant death syndrome.  The nutrients in breast milk are better for your baby than infant  formulas and are designed uniquely for your baby's needs.  Breast milk improves your baby's brain development.  Your baby is less likely to develop other conditions, such as childhood obesity, asthma, or type 2 diabetes mellitus. For You  Breastfeeding helps to create a very special bond between you and your baby.  Breastfeeding is convenient. Breast milk is always available at the correct temperature and costs nothing.  Breastfeeding helps to burn calories and helps you lose the weight gained during pregnancy.  Breastfeeding makes your uterus contract to its prepregnancy size faster and slows bleeding (lochia) after you give birth.   Breastfeeding helps to lower your risk of developing type 2 diabetes mellitus, osteoporosis, and breast or ovarian cancer later in life. SIGNS THAT YOUR BABY IS HUNGRY Early Signs of Hunger  Increased alertness or activity.  Stretching.  Movement of the head from side to side.  Movement of the head and opening of the mouth when the corner of the mouth or cheek is stroked (rooting).  Increased sucking sounds, smacking lips, cooing, sighing, or squeaking.  Hand-to-mouth movements.  Increased sucking of fingers or hands. Late Signs of Hunger  Fussing.  Intermittent crying. Extreme Signs of Hunger Signs of extreme hunger will require calming and consoling before your baby will be able to breastfeed successfully. Do not wait for the following signs of extreme hunger to occur before you initiate breastfeeding:  Restlessness.  A loud, strong cry.  Screaming. BREASTFEEDING BASICS Breastfeeding Initiation  Find a comfortable place to sit or lie down, with your neck and back well supported.  Place a pillow or rolled up blanket under your baby to bring him or her to the level of your breast (if you are seated). Nursing pillows are specially designed to help support your arms and your baby while you breastfeed.  Make sure that your baby's  abdomen is facing your abdomen.  Gently massage your breast. With your fingertips, massage from your chest wall toward your nipple in a circular motion. This encourages milk flow. You may need to continue this action during the feeding if your milk flows slowly.  Support your breast with 4 fingers underneath and your thumb above your nipple. Make sure your fingers are well away from your nipple and your baby's mouth.  Stroke your baby's lips gently with your finger or nipple.  When your baby's mouth is open wide enough, quickly bring your baby to your breast, placing your entire nipple and as much of the colored area around your nipple (areola) as possible into your baby's mouth.  More areola should be visible above your baby's upper lip than   below the lower lip.  Your baby's tongue should be between his or her lower gum and your breast.  Ensure that your baby's mouth is correctly positioned around your nipple (latched). Your baby's lips should create a seal on your breast and be turned out (everted).  It is common for your baby to suck about 2-3 minutes in order to start the flow of breast milk. Latching Teaching your baby how to latch on to your breast properly is very important. An improper latch can cause nipple pain and decreased milk supply for you and poor weight gain in your baby. Also, if your baby is not latched onto your nipple properly, he or she may swallow some air during feeding. This can make your baby fussy. Burping your baby when you switch breasts during the feeding can help to get rid of the air. However, teaching your baby to latch on properly is still the best way to prevent fussiness from swallowing air while breastfeeding. Signs that your baby has successfully latched on to your nipple:  Silent tugging or silent sucking, without causing you pain.  Swallowing heard between every 3-4 sucks.  Muscle movement above and in front of his or her ears while sucking. Signs  that your baby has not successfully latched on to nipple:  Sucking sounds or smacking sounds from your baby while breastfeeding.  Nipple pain. If you think your baby has not latched on correctly, slip your finger into the corner of your baby's mouth to break the suction and place it between your baby's gums. Attempt breastfeeding initiation again. Signs of Successful Breastfeeding Signs from your baby:  A gradual decrease in the number of sucks or complete cessation of sucking.  Falling asleep.  Relaxation of his or her body.  Retention of a small amount of milk in his or her mouth.  Letting go of your breast by himself or herself. Signs from you:  Breasts that have increased in firmness, weight, and size 1-3 hours after feeding.  Breasts that are softer immediately after breastfeeding.  Increased milk volume, as well as a change in milk consistency and color by the fifth day of breastfeeding.  Nipples that are not sore, cracked, or bleeding. Signs That Your Baby is Getting Enough Milk  Wetting at least 3 diapers in a 24-hour period. The urine should be clear and pale yellow by age 5 days.  At least 3 stools in a 24-hour period by age 5 days. The stool should be soft and yellow.  At least 3 stools in a 24-hour period by age 7 days. The stool should be seedy and yellow.  No loss of weight greater than 10% of birth weight during the first 3 days of age.  Average weight gain of 4-7 ounces (113-198 g) per week after age 4 days.  Consistent daily weight gain by age 5 days, without weight loss after the age of 2 weeks. After a feeding, your baby may spit up a small amount. This is common. BREASTFEEDING FREQUENCY AND DURATION Frequent feeding will help you make more milk and can prevent sore nipples and breast engorgement. Breastfeed when you feel the need to reduce the fullness of your breasts or when your baby shows signs of hunger. This is called "breastfeeding on demand." Avoid  introducing a pacifier to your baby while you are working to establish breastfeeding (the first 4-6 weeks after your baby is born). After this time you may choose to use a pacifier. Research has shown that   pacifier use during the first year of a baby's life decreases the risk of sudden infant death syndrome (SIDS). Allow your baby to feed on each breast as long as he or she wants. Breastfeed until your baby is finished feeding. When your baby unlatches or falls asleep while feeding from the first breast, offer the second breast. Because newborns are often sleepy in the first few weeks of life, you may need to awaken your baby to get him or her to feed. Breastfeeding times will vary from baby to baby. However, the following rules can serve as a guide to help you ensure that your baby is properly fed:  Newborns (babies 4 weeks of age or younger) may breastfeed every 1-3 hours.  Newborns should not go longer than 3 hours during the day or 5 hours during the night without breastfeeding.  You should breastfeed your baby a minimum of 8 times in a 24-hour period until you begin to introduce solid foods to your baby at around 6 months of age. BREAST MILK PUMPING Pumping and storing breast milk allows you to ensure that your baby is exclusively fed your breast milk, even at times when you are unable to breastfeed. This is especially important if you are going back to work while you are still breastfeeding or when you are not able to be present during feedings. Your lactation consultant can give you guidelines on how long it is safe to store breast milk. A breast pump is a machine that allows you to pump milk from your breast into a sterile bottle. The pumped breast milk can then be stored in a refrigerator or freezer. Some breast pumps are operated by hand, while others use electricity. Ask your lactation consultant which type will work best for you. Breast pumps can be purchased, but some hospitals and  breastfeeding support groups lease breast pumps on a monthly basis. A lactation consultant can teach you how to hand express breast milk, if you prefer not to use a pump. CARING FOR YOUR BREASTS WHILE YOU BREASTFEED Nipples can become dry, cracked, and sore while breastfeeding. The following recommendations can help keep your breasts moisturized and healthy:  Avoid using soap on your nipples.  Wear a supportive bra. Although not required, special nursing bras and tank tops are designed to allow access to your breasts for breastfeeding without taking off your entire bra or top. Avoid wearing underwire-style bras or extremely tight bras.  Air dry your nipples for 3-4minutes after each feeding.  Use only cotton bra pads to absorb leaked breast milk. Leaking of breast milk between feedings is normal.  Use lanolin on your nipples after breastfeeding. Lanolin helps to maintain your skin's normal moisture barrier. If you use pure lanolin, you do not need to wash it off before feeding your baby again. Pure lanolin is not toxic to your baby. You may also hand express a few drops of breast milk and gently massage that milk into your nipples and allow the milk to air dry. In the first few weeks after giving birth, some women experience extremely full breasts (engorgement). Engorgement can make your breasts feel heavy, warm, and tender to the touch. Engorgement peaks within 3-5 days after you give birth. The following recommendations can help ease engorgement:  Completely empty your breasts while breastfeeding or pumping. You may want to start by applying warm, moist heat (in the shower or with warm water-soaked hand towels) just before feeding or pumping. This increases circulation and helps the milk   flow. If your baby does not completely empty your breasts while breastfeeding, pump any extra milk after he or she is finished.  Wear a snug bra (nursing or regular) or tank top for 1-2 days to signal your body  to slightly decrease milk production.  Apply ice packs to your breasts, unless this is too uncomfortable for you.  Make sure that your baby is latched on and positioned properly while breastfeeding. If engorgement persists after 48 hours of following these recommendations, contact your health care provider or a lactation consultant. OVERALL HEALTH CARE RECOMMENDATIONS WHILE BREASTFEEDING  Eat healthy foods. Alternate between meals and snacks, eating 3 of each per day. Because what you eat affects your breast milk, some of the foods may make your baby more irritable than usual. Avoid eating these foods if you are sure that they are negatively affecting your baby.  Drink milk, fruit juice, and water to satisfy your thirst (about 10 glasses a day).  Rest often, relax, and continue to take your prenatal vitamins to prevent fatigue, stress, and anemia.  Continue breast self-awareness checks.  Avoid chewing and smoking tobacco. Chemicals from cigarettes that pass into breast milk and exposure to secondhand smoke may harm your baby.  Avoid alcohol and drug use, including marijuana. Some medicines that may be harmful to your baby can pass through breast milk. It is important to ask your health care provider before taking any medicine, including all over-the-counter and prescription medicine as well as vitamin and herbal supplements. It is possible to become pregnant while breastfeeding. If birth control is desired, ask your health care provider about options that will be safe for your baby. SEEK MEDICAL CARE IF:  You feel like you want to stop breastfeeding or have become frustrated with breastfeeding.  You have painful breasts or nipples.  Your nipples are cracked or bleeding.  Your breasts are red, tender, or warm.  You have a swollen area on either breast.  You have a fever or chills.  You have nausea or vomiting.  You have drainage other than breast milk from your nipples.  Your  breasts do not become full before feedings by the fifth day after you give birth.  You feel sad and depressed.  Your baby is too sleepy to eat well.  Your baby is having trouble sleeping.   Your baby is wetting less than 3 diapers in a 24-hour period.  Your baby has less than 3 stools in a 24-hour period.  Your baby's skin or the white part of his or her eyes becomes yellow.   Your baby is not gaining weight by 5 days of age. SEEK IMMEDIATE MEDICAL CARE IF:  Your baby is overly tired (lethargic) and does not want to wake up and feed.  Your baby develops an unexplained fever.   This information is not intended to replace advice given to you by your health care provider. Make sure you discuss any questions you have with your health care provider.   Document Released: 09/03/2005 Document Revised: 05/25/2015 Document Reviewed: 02/25/2013 Elsevier Interactive Patient Education 2016 [employer]  

## 2015-07-23 NOTE — Addendum Note (Signed)
Encounter addended by: Allie BossierMyra C Kinzi Frediani, MD on: 07/23/2015  7:13 AM<BR>     Documentation filed: Problem List

## 2015-07-26 ENCOUNTER — Encounter: Payer: BLUE CROSS/BLUE SHIELD | Admitting: Physician Assistant

## 2015-08-17 ENCOUNTER — Telehealth: Payer: Self-pay | Admitting: *Deleted

## 2015-08-17 NOTE — Telephone Encounter (Signed)
-----   Message from Olevia BowensJacinda S Battle sent at 08/16/2015  3:54 PM EST ----- Regarding: Advise Contact: [phone] Had a fall at work would like to talk to a nurse to be advised on what she should do, not currently having any pain. If cant get a hold of Ladona Ridgelaylor, call her mother on the number below. 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

## 2015-08-17 NOTE — Telephone Encounter (Signed)
I spoke with patient. Patient said she fell on 11/29 on her thigh and hip and did not fall completely so therefore she said she did not hit her stomach.  The baby is moving well, no bleeding and no pains.  I advised patient that if she started bleeding, having pains or not feeling the baby move then she needed to go be seen at [facility].   Patient is doing well today.

## 2015-08-22 ENCOUNTER — Encounter: Payer: Self-pay | Admitting: *Deleted

## 2015-09-06 ENCOUNTER — Ambulatory Visit (INDEPENDENT_AMBULATORY_CARE_PROVIDER_SITE_OTHER): Payer: BLUE CROSS/BLUE SHIELD | Admitting: Obstetrics & Gynecology

## 2015-09-06 VITALS — BP 130/80 | HR 89 | Wt 185.0 lb

## 2015-09-06 DIAGNOSIS — Z3402 Encounter for supervision of normal first pregnancy, second trimester: Secondary | ICD-10-CM

## 2015-09-06 NOTE — Progress Notes (Signed)
Subjective:  Danielle Morgan is a 19 y.o. G2P0 at 6167w1d being seen today for ongoing prenatal care.  She is currently monitored for the following issues for this low-risk pregnancy and has Frequent headaches and Encounter for supervision of normal first pregnancy in second trimester on her problem list.  Patient reports no complaints.  Contractions: Not present. Vag. Bleeding: None.  Movement: Present. Denies leaking of fluid.   The following portions of the patient's history were reviewed and updated as appropriate: allergies, current medications, past family history, past medical history, past social history, past surgical history and problem list. Problem list updated.  Objective:   Filed Vitals:   09/06/15 1606  BP: 130/80  Pulse: 89  Weight: 185 lb (83.915 kg)    Fetal Status: Fetal Heart Rate (bpm): 144   Movement: Present     General:  Alert, oriented and cooperative. Patient is in no acute distress.  Skin: Skin is warm and dry. No rash noted.   Cardiovascular: Normal heart rate noted  Respiratory: Normal respiratory effort, no problems with respiration noted  Abdomen: Soft, gravid, appropriate for gestational age. Pain/Pressure: Absent     Pelvic: Vag. Bleeding: None Vag D/C Character: Thin   Cervical exam deferred        Extremities: Normal range of motion.  Edema: Trace  Mental Status: Normal mood and affect. Normal behavior. Normal judgment and thought content.   Urinalysis: Urine Protein: Negative Urine Glucose: Negative  Assessment and Plan:  Pregnancy: G2P0 at 7267w1d  1. Encounter for supervision of normal first pregnancy in second trimester - She will come back this Friday for her glucola, labs, and tdap.  Preterm labor symptoms and general obstetric precautions including but not limited to vaginal bleeding, contractions, leaking of fluid and fetal movement were reviewed in detail with the patient. Please refer to After Visit Summary for other counseling  recommendations.  Return in about 4 weeks (around 10/04/2015).   Allie BossierMyra C Aigner Horseman, MD

## 2015-09-09 ENCOUNTER — Other Ambulatory Visit (INDEPENDENT_AMBULATORY_CARE_PROVIDER_SITE_OTHER): Payer: BLUE CROSS/BLUE SHIELD | Admitting: *Deleted

## 2015-09-09 DIAGNOSIS — Z3493 Encounter for supervision of normal pregnancy, unspecified, third trimester: Secondary | ICD-10-CM

## 2015-09-09 DIAGNOSIS — Z23 Encounter for immunization: Secondary | ICD-10-CM

## 2015-09-09 DIAGNOSIS — Z36 Encounter for antenatal screening of mother: Secondary | ICD-10-CM

## 2015-09-09 LAB — CBC
HCT: 30.8 % — ABNORMAL LOW (ref 36.0–46.0)
HEMOGLOBIN: 10.1 g/dL — AB (ref 12.0–15.0)
MCH: 28.9 pg (ref 26.0–34.0)
MCHC: 32.8 g/dL (ref 30.0–36.0)
MCV: 88 fL (ref 78.0–100.0)
MPV: 9.7 fL (ref 8.6–12.4)
PLATELETS: 263 10*3/uL (ref 150–400)
RBC: 3.5 MIL/uL — AB (ref 3.87–5.11)
RDW: 13.3 % (ref 11.5–15.5)
WBC: 11.5 10*3/uL — ABNORMAL HIGH (ref 4.0–10.5)

## 2015-09-09 NOTE — Addendum Note (Signed)
Addended by: Gita KudoLASSITER, Anysha Frappier S on: 09/09/2015 09:50 AM   Modules accepted: Orders

## 2015-09-09 NOTE — Progress Notes (Signed)
Patient here today for 28 week labs.

## 2015-09-10 LAB — RPR

## 2015-09-10 LAB — GLUCOSE TOLERANCE, 1 HOUR (50G) W/O FASTING: Glucose, 1 Hour GTT: 105 mg/dL (ref 70–140)

## 2015-09-10 LAB — HIV ANTIBODY (ROUTINE TESTING W REFLEX): HIV: NONREACTIVE

## 2015-09-18 NOTE — L&D Delivery Note (Signed)
Delivery Note At 6:02 PM a viable and healthy female was delivered via Vaginal, Spontaneous Delivery (Presentation: ; Occiput Anterior).  APGAR: 8, 9; weight  .            No difficulty with shoulders but there was a compound hand presentation. Placenta status: Intact, Spontaneous.  Cord: 3 vessels with the following complications: None.    Anesthesia: Epidural  Episiotomy: None Lacerations: Periurethral, bilateral Suture Repair: 4-0 Monocryl Est. Blood Loss (mL):  200  Mom to postpartum.  Baby to Couplet care / Skin to Skin.  [facility] 11/16/2015, 6:37 PM

## 2015-10-04 ENCOUNTER — Encounter: Payer: Self-pay | Admitting: *Deleted

## 2015-10-04 ENCOUNTER — Ambulatory Visit (INDEPENDENT_AMBULATORY_CARE_PROVIDER_SITE_OTHER): Payer: BLUE CROSS/BLUE SHIELD | Admitting: Obstetrics & Gynecology

## 2015-10-04 VITALS — BP 115/76 | HR 83 | Wt 195.0 lb

## 2015-10-04 DIAGNOSIS — Z3403 Encounter for supervision of normal first pregnancy, third trimester: Secondary | ICD-10-CM

## 2015-10-04 NOTE — Patient Instructions (Signed)
Return to clinic for any obstetric concerns or go to [facility] for evaluation  

## 2015-10-04 NOTE — Progress Notes (Signed)
Subjective:  Danielle Morgan is a 20 y.o. G2P0 at [redacted]w[redacted]d being seen today for ongoing prenatal care.  She is currently monitored for the following issues for this low-risk pregnancy and has Frequent headaches and Encounter for supervision of normal first pregnancy in third trimester on her problem list.  Patient reports no complaints.  Contractions: Not present. Vag. Bleeding: None.  Movement: Present. Denies leaking of fluid.   The following portions of the patient's history were reviewed and updated as appropriate: allergies, current medications, past family history, past medical history, past social history, past surgical history and problem list. Problem list updated.  Objective:   Filed Vitals:   10/04/15 1536  BP: 115/76  Pulse: 83  Weight: 195 lb (88.451 kg)    Fetal Status: Fetal Heart Rate (bpm): 145 Fundal Height: 32 cm Movement: Present     General:  Alert, oriented and cooperative. Patient is in no acute distress.  Skin: Skin is warm and dry. No rash noted.   Cardiovascular: Normal heart rate noted  Respiratory: Normal respiratory effort, no problems with respiration noted  Abdomen: Soft, gravid, appropriate for gestational age. Pain/Pressure: Absent     Pelvic: Vag. Bleeding: None Vag D/C Character: Thin   Cervical exam deferred        Extremities: Normal range of motion.  Edema: None  Mental Status: Normal mood and affect. Normal behavior. Normal judgment and thought content.   Urinalysis: Urine Protein: Negative Urine Glucose: Negative  Assessment and Plan:  Pregnancy: G2P0 at [redacted]w[redacted]d  Encounter for supervision of normal first pregnancy in third trimester Preterm labor symptoms and general obstetric precautions including but not limited to vaginal bleeding, contractions, leaking of fluid and fetal movement were reviewed in detail with the patient. Please refer to After Visit Summary for other counseling recommendations.  Return in about 4 weeks (around 11/01/2015) for OB  Visit, Pelvic cultures at 36 weeks ([employer]).   Tereso Newcomer, MD

## 2015-10-04 NOTE — Progress Notes (Signed)
Pt has been logging [employer], last BP = 122/82 on 10-01-15.

## 2015-10-31 ENCOUNTER — Ambulatory Visit (INDEPENDENT_AMBULATORY_CARE_PROVIDER_SITE_OTHER): Payer: BLUE CROSS/BLUE SHIELD | Admitting: Obstetrics & Gynecology

## 2015-10-31 VITALS — BP 125/84 | HR 86 | Wt 194.0 lb

## 2015-10-31 DIAGNOSIS — Z3403 Encounter for supervision of normal first pregnancy, third trimester: Secondary | ICD-10-CM

## 2015-10-31 DIAGNOSIS — Z113 Encounter for screening for infections with a predominantly sexual mode of transmission: Secondary | ICD-10-CM | POA: Diagnosis not present

## 2015-10-31 DIAGNOSIS — Z36 Encounter for antenatal screening of mother: Secondary | ICD-10-CM

## 2015-10-31 LAB — OB RESULTS CONSOLE GC/CHLAMYDIA: GC PROBE AMP, GENITAL: NEGATIVE

## 2015-10-31 LAB — OB RESULTS CONSOLE GBS: STREP GROUP B AG: POSITIVE

## 2015-10-31 NOTE — Progress Notes (Signed)
Subjective:  Danielle Morgan is a 20 y.o. G2P0 at [redacted]w[redacted]d being seen today for ongoing prenatal care.  She is currently monitored for the following issues for this low-risk pregnancy and has Frequent headaches and Encounter for supervision of normal first pregnancy in third trimester on her problem list.  Patient reports no complaints.  Contractions: Not present. Vag. Bleeding: None.  Movement: Present. Denies leaking of fluid.   The following portions of the patient's history were reviewed and updated as appropriate: allergies, current medications, past family history, past medical history, past social history, past surgical history and problem list. Problem list updated.  Objective:   Filed Vitals:   10/31/15 1431  BP: 125/84  Pulse: 86  Weight: 194 lb (87.998 kg)    Fetal Status: Fetal Heart Rate (bpm): 134   Movement: Present     General:  Alert, oriented and cooperative. Patient is in no acute distress.  Skin: Skin is warm and dry. No rash noted.   Cardiovascular: Normal heart rate noted  Respiratory: Normal respiratory effort, no problems with respiration noted  Abdomen: Soft, gravid, appropriate for gestational age. Pain/Pressure: Present     Pelvic: Vag. Bleeding: None     Cervical exam performed        Extremities: Normal range of motion.  Edema: None  Mental Status: Normal mood and affect. Normal behavior. Normal judgment and thought content.   Urinalysis:      Assessment and Plan:  Pregnancy: G2P0 at 104w0d  1. Encounter for supervision of normal first pregnancy in third trimester  - GC/Chlamydia probe amp ([place])not at [facility] - Culture, beta strep (group b only)  Preterm labor symptoms and general obstetric precautions including but not limited to vaginal bleeding, contractions, leaking of fluid and fetal movement were reviewed in detail with the patient. Please refer to After Visit Summary for other counseling recommendations.  Return in about 1 week (around  11/07/2015).   Allie Bossier, MD

## 2015-11-01 ENCOUNTER — Encounter: Payer: BLUE CROSS/BLUE SHIELD | Admitting: Obstetrics and Gynecology

## 2015-11-01 LAB — GC/CHLAMYDIA PROBE AMP (~~LOC~~) NOT AT ARMC
CHLAMYDIA, DNA PROBE: NEGATIVE
NEISSERIA GONORRHEA: NEGATIVE

## 2015-11-03 LAB — CULTURE, BETA STREP (GROUP B ONLY)

## 2015-11-05 ENCOUNTER — Inpatient Hospital Stay (HOSPITAL_COMMUNITY)
Admission: AD | Admit: 2015-11-05 | Discharge: 2015-11-05 | Disposition: A | Discharge status: Home / Self Care | Payer: BLUE CROSS/BLUE SHIELD | Source: Ambulatory Visit | Attending: Obstetrics and Gynecology | Admitting: Obstetrics and Gynecology

## 2015-11-05 ENCOUNTER — Encounter (HOSPITAL_COMMUNITY): Payer: Self-pay | Admitting: *Deleted

## 2015-11-05 DIAGNOSIS — Z3A36 36 weeks gestation of pregnancy: Secondary | ICD-10-CM | POA: Insufficient documentation

## 2015-11-05 DIAGNOSIS — Z79899 Other long term (current) drug therapy: Secondary | ICD-10-CM | POA: Insufficient documentation

## 2015-11-05 DIAGNOSIS — O368130 Decreased fetal movements, third trimester, not applicable or unspecified: Secondary | ICD-10-CM | POA: Insufficient documentation

## 2015-11-05 DIAGNOSIS — O368190 Decreased fetal movements, unspecified trimester, not applicable or unspecified: Secondary | ICD-10-CM

## 2015-11-05 NOTE — [facility] Provider Note (Signed)
[facility] HISTORY AND PHYSICAL  Chief Complaint:  Decreased Fetal Movement   Danielle Morgan is a 20 y.o.  G2P0010 with IUP at [redacted]w[redacted]d presenting for Decreased Fetal Movement  For past 2 days patient has experienced episodes of decreased fetal movement. Has not done kick counts. Normal fetal movement currently. No bleeding, no leakage of fluid, no contractions or other abdominal pain. No fevers or chills. Feeling well.   Past Medical History  Diagnosis Date  . Frequent headaches     Past Surgical History  Procedure Laterality Date  . No past surgeries      Family History  Problem Relation Age of Onset  . Cancer Paternal Grandmother     Lung  . Arthritis Mother   . Bipolar disorder Father   . ADD / ADHD Sister   . ADD / ADHD Brother   . Heart disease Maternal Uncle   . Arthritis Maternal Grandfather   . Hypertension Maternal Grandfather   . Alcohol abuse Maternal Grandfather     Social History  Substance Use Topics  . Smoking status: Never Smoker   . Smokeless tobacco: Never Used  . Alcohol Use: No    No Known Allergies  Prescriptions prior to admission  Medication Sig Dispense Refill Last Dose  . Prenat w/o A Vit-FeFum-FePo-FA (CONCEPT OB) 130-92.4-1 MG CAPS Take 1 capsule by mouth daily. 30 capsule 11 11/05/2015 at Unknown time  . butalbital-acetaminophen-caffeine (FIORICET, ESGIC) 50-325-40 MG per tablet Take 1 tablet by mouth daily as needed for headache. (Patient not taking: Reported on 11/05/2015) 20 tablet 0 Not Taking at Unknown time  . cyclobenzaprine (FLEXERIL) 10 MG tablet Take 1 tablet (10 mg total) by mouth every 8 (eight) hours as needed for muscle spasms. 30 tablet 1 prn  . promethazine (PHENERGAN) 25 MG tablet Take 1 tablet (25 mg total) by mouth every 6 (six) hours as needed for nausea or vomiting (May use 1/2 tab). (Patient not taking: Reported on 07/12/2015) 30 tablet 0 Not Taking at Unknown time    Review of Systems - Negative except for what is mentioned in  HPI.  Physical Exam  Blood pressure 133/75, pulse 75, temperature 97.7 F (36.5 C), temperature source Oral, resp. rate 16, height  (1.651 m), weight 200 lb 3.2 oz (90.81 kg), last menstrual period 02/21/2015, unknown if currently breastfeeding. GENERAL: Well-developed, well-nourished female in no acute distress.  LUNGS: Clear to auscultation bilaterally.  HEART: Regular rate and rhythm. ABDOMEN: Soft, nontender, nondistended, gravid.  EXTREMITIES: Nontender, no edema, 2+ distal pulses. FHT: 135/mod/+a/-d  AFI: 9.28 cm   Labs: No results found for this or any previous visit (from the past 24 hour(s)).  Imaging Studies:  No results found.  Assessment: Danielle Morgan is  20 y.o. G2P0010 at [redacted]w[redacted]d presents with Decreased Fetal Movement Currently normal movement. Modified BPP normal: reactive NST and normal AFI. No signs/symptoms pprom or ptl or abruption.  Plan: - daily kick counts - ordering ultrasound for growth - pprom, ptl, and abruption return precautions - OB f/u this week as scheduled  Silvano Bilis 2/18/20173:18 PM

## 2015-11-05 NOTE — [facility] Note (Signed)
Decreased fetal movement today, drank soda and laid down, didn't help with getting the baby to move.

## 2015-11-05 NOTE — Discharge Instructions (Signed)
Fetal Movement Counts  Patient Name: __________________________________________________ Patient Due Date: ____________________  Performing a fetal movement count is highly recommended in high-risk pregnancies, but it is good for every pregnant woman to do. Your health care provider may ask you to start counting fetal movements at 28 weeks of the pregnancy. Fetal movements often increase:  · After eating a full meal.  · After physical activity.  · After eating or drinking something sweet or cold.  · At rest.  Pay attention to when you feel the baby is most active. This will help you notice a pattern of your baby's sleep and wake cycles and what factors contribute to an increase in fetal movement. It is important to perform a fetal movement count at the same time each day when your baby is normally most active.   HOW TO COUNT FETAL MOVEMENTS  1. Find a quiet and comfortable area to sit or lie down on your left side. Lying on your left side provides the best blood and oxygen circulation to your baby.  2. Write down the day and time on a sheet of paper or in a journal.  3. Start counting kicks, flutters, swishes, rolls, or jabs in a 2-hour period. You should feel at least 10 movements within 2 hours.  4. If you do not feel 10 movements in 2 hours, wait 2-3 hours and count again. Look for a change in the pattern or not enough counts in 2 hours.  SEEK MEDICAL CARE IF:  · You feel less than 10 counts in 2 hours, tried twice.  · There is no movement in over an hour.  · The pattern is changing or taking longer each day to reach 10 counts in 2 hours.  · You feel the baby is not moving as he or she usually does.  Date: ____________ Movements: ____________ Start time: ____________ Finish time: ____________   Date: ____________ Movements: ____________ Start time: ____________ Finish time: ____________  Date: ____________ Movements: ____________ Start time: ____________ Finish time: ____________  Date: ____________ Movements:  ____________ Start time: ____________ Finish time: ____________  Date: ____________ Movements: ____________ Start time: ____________ Finish time: ____________  Date: ____________ Movements: ____________ Start time: ____________ Finish time: ____________  Date: ____________ Movements: ____________ Start time: ____________ Finish time: ____________  Date: ____________ Movements: ____________ Start time: ____________ Finish time: ____________   Date: ____________ Movements: ____________ Start time: ____________ Finish time: ____________  Date: ____________ Movements: ____________ Start time: ____________ Finish time: ____________  Date: ____________ Movements: ____________ Start time: ____________ Finish time: ____________  Date: ____________ Movements: ____________ Start time: ____________ Finish time: ____________  Date: ____________ Movements: ____________ Start time: ____________ Finish time: ____________  Date: ____________ Movements: ____________ Start time: ____________ Finish time: ____________  Date: ____________ Movements: ____________ Start time: ____________ Finish time: ____________   Date: ____________ Movements: ____________ Start time: ____________ Finish time: ____________  Date: ____________ Movements: ____________ Start time: ____________ Finish time: ____________  Date: ____________ Movements: ____________ Start time: ____________ Finish time: ____________  Date: ____________ Movements: ____________ Start time: ____________ Finish time: ____________  Date: ____________ Movements: ____________ Start time: ____________ Finish time: ____________  Date: ____________ Movements: ____________ Start time: ____________ Finish time: ____________  Date: ____________ Movements: ____________ Start time: ____________ Finish time: ____________   Date: ____________ Movements: ____________ Start time: ____________ Finish time: ____________  Date: ____________ Movements: ____________ Start time: ____________ Finish  time: ____________  Date: ____________ Movements: ____________ Start time: ____________ Finish time: ____________  Date: ____________ Movements: ____________ Start time:   ____________ Finish time: ____________  Date: ____________ Movements: ____________ Start time: ____________ Finish time: ____________  Date: ____________ Movements: ____________ Start time: ____________ Finish time: ____________  Date: ____________ Movements: ____________ Start time: ____________ Finish time: ____________   Date: ____________ Movements: ____________ Start time: ____________ Finish time: ____________  Date: ____________ Movements: ____________ Start time: ____________ Finish time: ____________  Date: ____________ Movements: ____________ Start time: ____________ Finish time: ____________  Date: ____________ Movements: ____________ Start time: ____________ Finish time: ____________  Date: ____________ Movements: ____________ Start time: ____________ Finish time: ____________  Date: ____________ Movements: ____________ Start time: ____________ Finish time: ____________  Date: ____________ Movements: ____________ Start time: ____________ Finish time: ____________   Date: ____________ Movements: ____________ Start time: ____________ Finish time: ____________  Date: ____________ Movements: ____________ Start time: ____________ Finish time: ____________  Date: ____________ Movements: ____________ Start time: ____________ Finish time: ____________  Date: ____________ Movements: ____________ Start time: ____________ Finish time: ____________  Date: ____________ Movements: ____________ Start time: ____________ Finish time: ____________  Date: ____________ Movements: ____________ Start time: ____________ Finish time: ____________  Date: ____________ Movements: ____________ Start time: ____________ Finish time: ____________   Date: ____________ Movements: ____________ Start time: ____________ Finish time: ____________  Date: ____________  Movements: ____________ Start time: ____________ Finish time: ____________  Date: ____________ Movements: ____________ Start time: ____________ Finish time: ____________  Date: ____________ Movements: ____________ Start time: ____________ Finish time: ____________  Date: ____________ Movements: ____________ Start time: ____________ Finish time: ____________  Date: ____________ Movements: ____________ Start time: ____________ Finish time: ____________  Date: ____________ Movements: ____________ Start time: ____________ Finish time: ____________   Date: ____________ Movements: ____________ Start time: ____________ Finish time: ____________  Date: ____________ Movements: ____________ Start time: ____________ Finish time: ____________  Date: ____________ Movements: ____________ Start time: ____________ Finish time: ____________  Date: ____________ Movements: ____________ Start time: ____________ Finish time: ____________  Date: ____________ Movements: ____________ Start time: ____________ Finish time: ____________  Date: ____________ Movements: ____________ Start time: ____________ Finish time: ____________     This information is not intended to replace advice given to you by your health care provider. Make sure you discuss any questions you have with your health care provider.     Document Released: 10/03/2006 Document Revised: 09/24/2014 Document Reviewed: 06/30/2012  Elsevier Interactive Patient Education ©2016 [employer]

## 2015-11-08 ENCOUNTER — Encounter: Payer: Self-pay | Admitting: Obstetrics & Gynecology

## 2015-11-08 DIAGNOSIS — O99820 Streptococcus B carrier state complicating pregnancy: Secondary | ICD-10-CM | POA: Insufficient documentation

## 2015-11-14 ENCOUNTER — Ambulatory Visit (INDEPENDENT_AMBULATORY_CARE_PROVIDER_SITE_OTHER): Payer: BLUE CROSS/BLUE SHIELD | Admitting: Obstetrics and Gynecology

## 2015-11-14 ENCOUNTER — Encounter: Payer: Self-pay | Admitting: Obstetrics and Gynecology

## 2015-11-14 VITALS — BP 136/86 | HR 92 | Wt 201.0 lb

## 2015-11-14 DIAGNOSIS — Z3403 Encounter for supervision of normal first pregnancy, third trimester: Secondary | ICD-10-CM

## 2015-11-14 DIAGNOSIS — Z22330 Carrier of Group B streptococcus: Secondary | ICD-10-CM

## 2015-11-14 DIAGNOSIS — O99820 Streptococcus B carrier state complicating pregnancy: Secondary | ICD-10-CM

## 2015-11-14 NOTE — Progress Notes (Signed)
Subjective:  Danielle Morgan is a 20 y.o. G2P0010 at [redacted]w[redacted]d being seen today for ongoing prenatal care.  She is currently monitored for the following issues for this low-risk pregnancy and has Frequent headaches; Encounter for supervision of normal first pregnancy in third trimester; and GBS (group B Streptococcus carrier), +RV culture, currently pregnant on her problem list.  Patient reports no complaints.  Contractions: Irregular. Vag. Bleeding: None.  Movement: Present. Denies leaking of fluid.   The following portions of the patient's history were reviewed and updated as appropriate: allergies, current medications, past family history, past medical history, past social history, past surgical history and problem list. Problem list updated.  Objective:   Filed Vitals:   11/14/15 1546  BP: 136/86  Pulse: 92  Weight: 201 lb (91.173 kg)    Fetal Status: Fetal Heart Rate (bpm): 162 Fundal Height: 37 cm Movement: Present  Presentation: Vertex  General:  Alert, oriented and cooperative. Patient is in no acute distress.  Skin: Skin is warm and dry. No rash noted.   Cardiovascular: Normal heart rate noted  Respiratory: Normal respiratory effort, no problems with respiration noted  Abdomen: Soft, gravid, appropriate for gestational age. Pain/Pressure: Present     Pelvic: Vag. Bleeding: None Vag D/C Character: Thin   Cervical exam performed Dilation: 1 Effacement (%): Thick Station: Ballotable  Extremities: Normal range of motion.  Edema: Trace  Mental Status: Normal mood and affect. Normal behavior. Normal judgment and thought content.   Urinalysis: Urine Protein: 1+ Urine Glucose: Negative  Assessment and Plan:  Pregnancy: G2P0010 at [redacted]w[redacted]d  1. Encounter for supervision of normal first pregnancy in third trimester Patient doing well  2. GBS (group B Streptococcus carrier), +RV culture, currently pregnant Will need prophylaxis in labor  Term labor symptoms and general obstetric precautions  including but not limited to vaginal bleeding, contractions, leaking of fluid and fetal movement were reviewed in detail with the patient. Please refer to After Visit Summary for other counseling recommendations.  Return in about 1 week (around 11/21/2015).   Catalina Antigua, MD

## 2015-11-15 ENCOUNTER — Inpatient Hospital Stay (HOSPITAL_COMMUNITY)
Admission: AD | Admit: 2015-11-15 | Discharge: 2015-11-16 | Disposition: A | Discharge status: Home / Self Care | Payer: BLUE CROSS/BLUE SHIELD | Source: Ambulatory Visit | Attending: Obstetrics & Gynecology | Admitting: Obstetrics & Gynecology

## 2015-11-15 ENCOUNTER — Encounter (HOSPITAL_COMMUNITY): Payer: Self-pay

## 2015-11-15 ENCOUNTER — Encounter: Payer: Self-pay | Admitting: *Deleted

## 2015-11-15 ENCOUNTER — Telehealth: Payer: Self-pay | Admitting: *Deleted

## 2015-11-15 DIAGNOSIS — O99820 Streptococcus B carrier state complicating pregnancy: Secondary | ICD-10-CM

## 2015-11-15 NOTE — [facility] Note (Signed)
Pt reports contractions q 4-6, denies bleeding or ROM

## 2015-11-15 NOTE — Telephone Encounter (Signed)
Pt missed work today due to discomfort and contractions over night, requesting note for work.  Note completed and notified pt.

## 2015-11-15 NOTE — [facility] Note (Signed)
Urine sent to lab 

## 2015-11-15 NOTE — Progress Notes (Signed)
Called to give report. In delivery. Will call back

## 2015-11-16 ENCOUNTER — Inpatient Hospital Stay (HOSPITAL_COMMUNITY): Payer: BLUE CROSS/BLUE SHIELD | Admitting: Anesthesiology

## 2015-11-16 ENCOUNTER — Inpatient Hospital Stay (HOSPITAL_COMMUNITY)
Admission: AD | Admit: 2015-11-16 | Discharge: 2015-11-18 | DRG: 775 | Disposition: A | Discharge status: Home / Self Care | Payer: BLUE CROSS/BLUE SHIELD | Source: Ambulatory Visit | Attending: Obstetrics & Gynecology | Admitting: Obstetrics & Gynecology

## 2015-11-16 ENCOUNTER — Encounter (HOSPITAL_COMMUNITY): Payer: Self-pay | Admitting: *Deleted

## 2015-11-16 DIAGNOSIS — O134 Gestational [pregnancy-induced] hypertension without significant proteinuria, complicating childbirth: Principal | ICD-10-CM | POA: Diagnosis present

## 2015-11-16 DIAGNOSIS — O99824 Streptococcus B carrier state complicating childbirth: Secondary | ICD-10-CM | POA: Diagnosis present

## 2015-11-16 DIAGNOSIS — Z3A38 38 weeks gestation of pregnancy: Secondary | ICD-10-CM

## 2015-11-16 DIAGNOSIS — Z818 Family history of other mental and behavioral disorders: Secondary | ICD-10-CM | POA: Diagnosis not present

## 2015-11-16 DIAGNOSIS — Z8249 Family history of ischemic heart disease and other diseases of the circulatory system: Secondary | ICD-10-CM

## 2015-11-16 DIAGNOSIS — IMO0001 Reserved for inherently not codable concepts without codable children: Secondary | ICD-10-CM

## 2015-11-16 DIAGNOSIS — O322XX0 Maternal care for transverse and oblique lie, not applicable or unspecified: Secondary | ICD-10-CM | POA: Diagnosis present

## 2015-11-16 LAB — COMPREHENSIVE METABOLIC PANEL
ALT: 14 U/L (ref 14–54)
AST: 27 U/L (ref 15–41)
Albumin: 3 g/dL — ABNORMAL LOW (ref 3.5–5.0)
Alkaline Phosphatase: 134 U/L — ABNORMAL HIGH (ref 38–126)
Anion gap: 11 (ref 5–15)
BUN: 10 mg/dL (ref 6–20)
CHLORIDE: 105 mmol/L (ref 101–111)
CO2: 18 mmol/L — AB (ref 22–32)
CREATININE: 0.55 mg/dL (ref 0.44–1.00)
Calcium: 9.2 mg/dL (ref 8.9–10.3)
GFR calc Af Amer: >60 mL/min (ref >60)
Glucose, Bld: 100 mg/dL — ABNORMAL HIGH (ref 65–99)
POTASSIUM: 3.9 mmol/L (ref 3.5–5.1)
SODIUM: 134 mmol/L — AB (ref 135–145)
Total Bilirubin: 0.5 mg/dL (ref 0.3–1.2)
Total Protein: 7 g/dL (ref 6.5–8.1)

## 2015-11-16 LAB — CBC
HEMATOCRIT: 31.6 % — AB (ref 36.0–46.0)
HEMOGLOBIN: 10 g/dL — AB (ref 12.0–15.0)
MCH: 23.9 pg — AB (ref 26.0–34.0)
MCHC: 31.6 g/dL (ref 30.0–36.0)
MCV: 75.6 fL — AB (ref 78.0–100.0)
PLATELETS: 309 10*3/uL (ref 150–400)
RBC: 4.18 MIL/uL (ref 3.87–5.11)
RDW: 16 % — ABNORMAL HIGH (ref 11.5–15.5)
WBC: 16 10*3/uL — AB (ref 4.0–10.5)

## 2015-11-16 LAB — PROTEIN / CREATININE RATIO, URINE
CREATININE, URINE: 175 mg/dL
Protein Creatinine Ratio: 0.27 mg/mg{Cre} — ABNORMAL HIGH (ref 0.00–0.15)
TOTAL PROTEIN, URINE: 48 mg/dL

## 2015-11-16 LAB — RPR: RPR: NONREACTIVE

## 2015-11-16 LAB — TYPE AND SCREEN
ABO/RH(D): A POS
Antibody Screen: NEGATIVE

## 2015-11-16 MED ORDER — PRENATAL MULTIVITAMIN CH
1.0000 | ORAL_TABLET | Freq: Every day | ORAL | Status: DC
  Administered 2015-11-17 – 2015-11-18 (×2): 1 via ORAL
  Filled 2015-11-16 (×2): qty 1

## 2015-11-16 MED ORDER — ONDANSETRON HCL 4 MG PO TABS: 4.0000 mg | ORAL_TABLET | ORAL | Status: DC | PRN

## 2015-11-16 MED ORDER — LACTATED RINGERS IV SOLN: 500.0000 mL | Freq: Once | INTRAVENOUS | Status: DC

## 2015-11-16 MED ORDER — PHENYLEPHRINE 40 MCG/ML (10ML) SYRINGE FOR IV PUSH (FOR BLOOD PRESSURE SUPPORT)
PREFILLED_SYRINGE | INTRAVENOUS | Status: AC
  Filled 2015-11-16: qty 20

## 2015-11-16 MED ORDER — ACETAMINOPHEN 325 MG PO TABS: 650.0000 mg | ORAL_TABLET | ORAL | Status: DC | PRN

## 2015-11-16 MED ORDER — LIDOCAINE HCL (PF) 1 % IJ SOLN
30.0000 mL | INTRAMUSCULAR | Status: AC | PRN
  Administered 2015-11-16: 30 mL via SUBCUTANEOUS
  Filled 2015-11-16: qty 30

## 2015-11-16 MED ORDER — FENTANYL 2.5 MCG/ML BUPIVACAINE 1/10 % EPIDURAL INFUSION (WH - ANES)
INTRAMUSCULAR | Status: AC
  Filled 2015-11-16: qty 125

## 2015-11-16 MED ORDER — DIBUCAINE 1 % RE OINT: 1.0000 "application " | TOPICAL_OINTMENT | RECTAL | Status: DC | PRN

## 2015-11-16 MED ORDER — SENNOSIDES-DOCUSATE SODIUM 8.6-50 MG PO TABS
2.0000 | ORAL_TABLET | ORAL | Status: DC
  Administered 2015-11-17 (×2): 2 via ORAL
  Filled 2015-11-16 (×2): qty 2

## 2015-11-16 MED ORDER — TETANUS-DIPHTH-ACELL PERTUSSIS 5-2.5-18.5 LF-MCG/0.5 IM SUSP: 0.5000 mL | Freq: Once | INTRAMUSCULAR | Status: DC

## 2015-11-16 MED ORDER — EPHEDRINE 5 MG/ML INJ
10.0000 mg | INTRAVENOUS | Status: DC | PRN
Start: 2015-11-16 — End: 2015-11-16
  Filled 2015-11-16: qty 2

## 2015-11-16 MED ORDER — ONDANSETRON HCL 4 MG/2ML IJ SOLN: 4.0000 mg | Freq: Four times a day (QID) | INTRAMUSCULAR | Status: DC | PRN

## 2015-11-16 MED ORDER — EPHEDRINE 5 MG/ML INJ
10.0000 mg | INTRAVENOUS | Status: DC | PRN
  Filled 2015-11-16: qty 2

## 2015-11-16 MED ORDER — IBUPROFEN 600 MG PO TABS
600.0000 mg | ORAL_TABLET | Freq: Four times a day (QID) | ORAL | Status: DC
  Administered 2015-11-16 – 2015-11-18 (×8): 600 mg via ORAL
  Filled 2015-11-16 (×9): qty 1

## 2015-11-16 MED ORDER — DIPHENHYDRAMINE HCL 25 MG PO CAPS: 25.0000 mg | ORAL_CAPSULE | Freq: Four times a day (QID) | ORAL | Status: DC | PRN

## 2015-11-16 MED ORDER — LANOLIN HYDROUS EX OINT: TOPICAL_OINTMENT | CUTANEOUS | Status: DC | PRN

## 2015-11-16 MED ORDER — LIDOCAINE HCL (PF) 1 % IJ SOLN
INTRAMUSCULAR | Status: DC | PRN
  Administered 2015-11-16 (×2): 4 mL via EPIDURAL

## 2015-11-16 MED ORDER — OXYCODONE-ACETAMINOPHEN 5-325 MG PO TABS: 1.0000 | ORAL_TABLET | ORAL | Status: DC | PRN

## 2015-11-16 MED ORDER — OXYCODONE-ACETAMINOPHEN 5-325 MG PO TABS: 2.0000 | ORAL_TABLET | ORAL | Status: DC | PRN

## 2015-11-16 MED ORDER — LACTATED RINGERS IV SOLN
INTRAVENOUS | Status: DC
Start: 2015-11-16 — End: 2015-11-16
  Administered 2015-11-16 (×2): via INTRAVENOUS

## 2015-11-16 MED ORDER — PHENYLEPHRINE 40 MCG/ML (10ML) SYRINGE FOR IV PUSH (FOR BLOOD PRESSURE SUPPORT)
80.0000 ug | PREFILLED_SYRINGE | INTRAVENOUS | Status: DC | PRN
  Filled 2015-11-16: qty 2

## 2015-11-16 MED ORDER — SIMETHICONE 80 MG PO CHEW: 80.0000 mg | CHEWABLE_TABLET | ORAL | Status: DC | PRN

## 2015-11-16 MED ORDER — BENZOCAINE-MENTHOL 20-0.5 % EX AERO
1.0000 "application " | INHALATION_SPRAY | CUTANEOUS | Status: DC | PRN
  Administered 2015-11-16: 1 via TOPICAL
  Filled 2015-11-16 (×2): qty 56

## 2015-11-16 MED ORDER — DIPHENHYDRAMINE HCL 50 MG/ML IJ SOLN: 12.5000 mg | INTRAMUSCULAR | Status: DC | PRN

## 2015-11-16 MED ORDER — TERBUTALINE SULFATE 1 MG/ML IJ SOLN
0.2500 mg | Freq: Once | INTRAMUSCULAR | Status: DC | PRN
  Filled 2015-11-16: qty 1

## 2015-11-16 MED ORDER — FENTANYL 2.5 MCG/ML BUPIVACAINE 1/10 % EPIDURAL INFUSION (WH - ANES)
14.0000 mL/h | INTRAMUSCULAR | Status: DC | PRN
  Administered 2015-11-16 (×3): 14 mL/h via EPIDURAL
  Filled 2015-11-16 (×2): qty 125

## 2015-11-16 MED ORDER — OXYTOCIN 10 UNIT/ML IJ SOLN
1.0000 m[IU]/min | INTRAVENOUS | Status: DC
  Administered 2015-11-16: 2 m[IU]/min via INTRAVENOUS
  Filled 2015-11-16: qty 10

## 2015-11-16 MED ORDER — ZOLPIDEM TARTRATE 5 MG PO TABS: 5.0000 mg | ORAL_TABLET | Freq: Every evening | ORAL | Status: DC | PRN

## 2015-11-16 MED ORDER — SODIUM BICARBONATE 8.4 % IV SOLN
INTRAVENOUS | Status: DC | PRN
  Administered 2015-11-16 (×6): 4 mL via EPIDURAL

## 2015-11-16 MED ORDER — PENICILLIN G POTASSIUM 5000000 UNITS IJ SOLR
5.0000 10*6.[IU] | Freq: Once | INTRAVENOUS | Status: AC
  Administered 2015-11-16: 5 10*6.[IU] via INTRAVENOUS
  Filled 2015-11-16: qty 5

## 2015-11-16 MED ORDER — WITCH HAZEL-GLYCERIN EX PADS: 1.0000 "application " | MEDICATED_PAD | CUTANEOUS | Status: DC | PRN

## 2015-11-16 MED ORDER — OXYTOCIN 10 UNIT/ML IJ SOLN: 2.5000 [IU]/h | INTRAVENOUS | Status: DC

## 2015-11-16 MED ORDER — OXYTOCIN BOLUS FROM INFUSION: 500.0000 mL | INTRAVENOUS | Status: DC

## 2015-11-16 MED ORDER — ONDANSETRON HCL 4 MG/2ML IJ SOLN: 4.0000 mg | INTRAMUSCULAR | Status: DC | PRN

## 2015-11-16 MED ORDER — LACTATED RINGERS IV SOLN: 500.0000 mL | INTRAVENOUS | Status: DC | PRN

## 2015-11-16 MED ORDER — PENICILLIN G POTASSIUM 5000000 UNITS IJ SOLR
2.5000 10*6.[IU] | INTRAVENOUS | Status: DC
  Administered 2015-11-16 (×2): 2.5 10*6.[IU] via INTRAVENOUS
  Filled 2015-11-16 (×5): qty 2.5

## 2015-11-16 MED ORDER — CITRIC ACID-SODIUM CITRATE 334-500 MG/5ML PO SOLN: 30.0000 mL | ORAL | Status: DC | PRN

## 2015-11-16 NOTE — Progress Notes (Signed)
Patient ID: Danielle Morgan, female   DOB: 1995-12-21, 20 y.o.   MRN: [id number] Comfortable with epidural  Filed Vitals:   11/16/15 1200 11/16/15 1230 11/16/15 1300 11/16/15 1301  BP: 125/78 118/67  134/71  Pulse: 116 108  136  Temp:      TempSrc:      Resp: Height:      Weight:      SpO2:       Results for orders placed or performed during the hospital encounter of 11/16/15 (from the past 24 hour(s))  CBC     Status: Abnormal   Collection Time: 11/16/15  5:45 AM  Result Value Ref Range   WBC 16.0 (H) 4.0 - 10.5 K/uL   RBC 4.18 3.87 - 5.11 MIL/uL   Hemoglobin 10.0 (L) 12.0 - 15.0 g/dL   HCT 82.9 (L) 56.2 - 13.0 %   MCV 75.6 (L) 78.0 - 100.0 fL   MCH 23.9 (L) 26.0 - 34.0 pg   MCHC 31.6 30.0 - 36.0 g/dL   RDW 86.5 (H) 78.4 - 69.6 %   Platelets 309 150 - 400 K/uL  Type and screen [facility]      Status: None   Collection Time: 11/16/15  5:45 AM  Result Value Ref Range   ABO/RH(D) A POS    Antibody Screen NEG    Sample Expiration 11/19/2015   Comprehensive metabolic panel     Status: Abnormal   Collection Time: 11/16/15  5:45 AM  Result Value Ref Range   Sodium 134 (L) 135 - 145 mmol/L   Potassium 3.9 3.5 - 5.1 mmol/L   Chloride 105 101 - 111 mmol/L   CO2 18 (L) 22 - 32 mmol/L   Glucose, Bld 100 (H) 65 - 99 mg/dL   BUN 10 6 - 20 mg/dL   Creatinine, Ser 2.95 0.44 - 1.00 mg/dL   Calcium 9.2 8.9 - 28.4 mg/dL   Total Protein 7.0 6.5 - 8.1 g/dL   Albumin 3.0 (L) 3.5 - 5.0 g/dL   AST 27 15 - 41 U/L   ALT 14 14 - 54 U/L   Alkaline Phosphatase 134 (H) 38 - 126 U/L   Total Bilirubin 0.5 0.3 - 1.2 mg/dL   GFR calc non Af Amer >60 >60 mL/min   GFR calc Af Amer >60 >60 mL/min   Anion gap 11 5 - 15  Protein / creatinine ratio, urine     Status: Abnormal   Collection Time: 11/16/15  7:30 AM  Result Value Ref Range   Creatinine, Urine 175.00 mg/dL   Total Protein, Urine 48 mg/dL   Protein Creatinine Ratio 0.27 (H) 0.00 - 0.15 mg/mg[Cre]   FHR  reassuring UCs every 2-3 min  Cervix 6-7/100/-3/vertex AROM clear fluid  IUPC inserted due to no change in cervix  Will start Pitocin if inadequate Discussed with patient and family who agree

## 2015-11-16 NOTE — Anesthesia Procedure Notes (Addendum)
Epidural Patient location during procedure: OB Start time: 11/16/2015 6:39 AM End time: 11/16/2015 6:47 AM  Staffing Anesthesiologist: Shona Simpson D Performed by: anesthesiologist   Preanesthetic Checklist Completed: patient identified, site marked, surgical consent, pre-op evaluation, timeout performed, IV checked, risks and benefits discussed and monitors and equipment checked  Epidural Patient position: sitting Prep: ChloraPrep Patient monitoring: heart rate, continuous pulse ox and blood pressure Approach: midline Location: L3-L4 Injection technique: LOR saline  Needle:  Needle type: Tuohy  Needle gauge: 17 G Needle length: 9 cm Catheter type: closed end flexible Catheter size: 20 Guage Test dose: negative and 1.5% lidocaine  Assessment Events: blood not aspirated, injection not painful, no injection resistance and no paresthesia  Additional Notes LOR @ 6.5  Patient identified. Risks/Benefits/Options discussed with patient including but not limited to bleeding, infection, nerve damage, paralysis, failed block, incomplete pain control, headache, blood pressure changes, nausea, vomiting, reactions to medications, itching and postpartum back pain. Confirmed with bedside nurse the patient's most recent platelet count. Confirmed with patient that they are not currently taking any anticoagulation, have any bleeding history or any family history of bleeding disorders. Patient expressed understanding and wished to proceed. All questions were answered. Sterile technique was used throughout the entire procedure. Please see nursing notes for vital signs. Test dose was given through epidural catheter and negative prior to continuing to dose epidural or start infusion. Warning signs of high block given to the patient including shortness of breath, tingling/numbness in hands, complete motor block, or any concerning symptoms with instructions to call for help. Patient was given instructions on  fall risk and not to get out of bed. All questions and concerns addressed with instructions to call with any issues or inadequate analgesia.    Reason for block:procedure for pain  Epidural Patient location during procedure: OB Start time: 11/16/2015 8:32 AM  Staffing Anesthesiologist: Mal Amabile Performed by: anesthesiologist   Preanesthetic Checklist Completed: patient identified, site marked, surgical consent, pre-op evaluation, timeout performed, IV checked, risks and benefits discussed and monitors and equipment checked  Epidural Patient position: sitting Prep: site prepped and draped and DuraPrep Patient monitoring: continuous pulse ox and blood pressure Approach: midline Location: L3-L4 Injection technique: LOR air  Needle:  Needle type: Tuohy  Needle gauge: 17 G Needle length: 9 cm and 9 Needle insertion depth: 6 cm Catheter type: closed end flexible Catheter size: 19 Gauge Catheter at skin depth: 11 cm Test dose: negative and Other  Assessment Events: blood not aspirated, injection not painful, no injection resistance, negative IV test and no paresthesia  Additional Notes Patient identified. Risks and benefits discussed including failed block, incomplete  Pain control, post dural puncture headache, nerve damage, paralysis, blood pressure Changes, nausea, vomiting, reactions to medications-both toxic and allergic and post Partum back pain. All questions were answered. Patient expressed understanding and wished to proceed. Sterile technique was used throughout procedure. Epidural site was Dressed with sterile barrier dressing. No paresthesias, signs of intravascular injection Or signs of intrathecal spread were encountered.  Patient was more comfortable after the epidural was dosed. Please see RN's note for documentation of vital signs and FHR which are stable.

## 2015-11-16 NOTE — Discharge Instructions (Signed)
Third Trimester of Pregnancy °The third trimester is from week 29 through week 42, months 7 through 9. The third trimester is a time when the fetus is growing rapidly. At the end of the ninth month, the fetus is about 20 inches in length and weighs 6-10 pounds.  °BODY CHANGES °Your body goes through many changes during pregnancy. The changes vary from woman to woman.  °· Your weight will continue to increase. You can expect to gain 25-35 pounds (11-16 kg) by the end of the pregnancy. °· You may begin to get stretch marks on your hips, abdomen, and breasts. °· You may urinate more often because the fetus is moving lower into your pelvis and pressing on your bladder. °· You may develop or continue to have heartburn as a result of your pregnancy. °· You may develop constipation because certain hormones are causing the muscles that push waste through your intestines to slow down. °· You may develop hemorrhoids or swollen, bulging veins (varicose veins). °· You may have pelvic pain because of the weight gain and pregnancy hormones relaxing your joints between the bones in your pelvis. Backaches may result from overexertion of the muscles supporting your posture. °· You may have changes in your hair. These can include thickening of your hair, rapid growth, and changes in texture. Some women also have hair loss during or after pregnancy, or hair that feels dry or thin. Your hair will most likely return to normal after your baby is born. °· Your breasts will continue to grow and be tender. A yellow discharge may leak from your breasts called colostrum. °· Your belly button may stick out. °· You may feel short of breath because of your expanding uterus. °· You may notice the fetus "dropping," or moving lower in your abdomen. °· You may have a bloody mucus discharge. This usually occurs a few days to a week before labor begins. °· Your cervix becomes thin and soft (effaced) near your due date. °WHAT TO EXPECT AT YOUR PRENATAL  EXAMS  °You will have prenatal exams every 2 weeks until week 36. Then, you will have weekly prenatal exams. During a routine prenatal visit: °· You will be weighed to make sure you and the fetus are growing normally. °· Your blood pressure is taken. °· Your abdomen will be measured to track your baby's growth. °· The fetal heartbeat will be listened to. °· Any test results from the previous visit will be discussed. °· You may have a cervical check near your due date to see if you have effaced. °At around 36 weeks, your caregiver will check your cervix. At the same time, your caregiver will also perform a test on the secretions of the vaginal tissue. This test is to determine if a type of bacteria, Group B streptococcus, is present. Your caregiver will explain this further. °Your caregiver may ask you: °· What your birth plan is. °· How you are feeling. °· If you are feeling the baby move. °· If you have had any abnormal symptoms, such as leaking fluid, bleeding, severe headaches, or abdominal cramping. °· If you are using any tobacco products, including cigarettes, chewing tobacco, and electronic cigarettes. °· If you have any questions. °Other tests or screenings that may be performed during your third trimester include: °· Blood tests that check for low iron levels (anemia). °· Fetal testing to check the health, activity level, and growth of the fetus. Testing is done if you have certain medical conditions or if   there are problems during the pregnancy. °· HIV (human immunodeficiency virus) testing. If you are at high risk, you may be screened for HIV during your third trimester of pregnancy. °FALSE LABOR °You may feel small, irregular contractions that eventually go away. These are called Braxton Hicks contractions, or false labor. Contractions may last for hours, days, or even weeks before true labor sets in. If contractions come at regular intervals, intensify, or become painful, it is best to be seen by your  caregiver.  °SIGNS OF LABOR  °· Menstrual-like cramps. °· Contractions that are 5 minutes apart or less. °· Contractions that start on the top of the uterus and spread down to the lower abdomen and back. °· A sense of increased pelvic pressure or back pain. °· A watery or bloody mucus discharge that comes from the vagina. °If you have any of these signs before the 37th week of pregnancy, call your caregiver right away. You need to go to the hospital to get checked immediately. °HOME CARE INSTRUCTIONS  °· Avoid all smoking, herbs, alcohol, and unprescribed drugs. These chemicals affect the formation and growth of the baby. °· Do not use any tobacco products, including cigarettes, chewing tobacco, and electronic cigarettes. If you need help quitting, ask your health care provider. You may receive counseling support and other resources to help you quit. °· Follow your caregiver's instructions regarding medicine use. There are medicines that are either safe or unsafe to take during pregnancy. °· Exercise only as directed by your caregiver. Experiencing uterine cramps is a good sign to stop exercising. °· Continue to eat regular, healthy meals. °· Wear a good support bra for breast tenderness. °· Do not use hot tubs, steam rooms, or saunas. °· Wear your seat belt at all times when driving. °· Avoid raw meat, uncooked cheese, cat litter boxes, and soil used by cats. These carry germs that can cause birth defects in the baby. °· Take your prenatal vitamins. °· Take 1500-2000 mg of calcium daily starting at the 20th week of pregnancy until you deliver your baby. °· Try taking a stool softener (if your caregiver approves) if you develop constipation. Eat more high-fiber foods, such as fresh vegetables or fruit and whole grains. Drink plenty of fluids to keep your urine clear or pale yellow. °· Take warm sitz baths to soothe any pain or discomfort caused by hemorrhoids. Use hemorrhoid cream if your caregiver approves. °· If  you develop varicose veins, wear support hose. Elevate your feet for 15 minutes, 3-4 times a day. Limit salt in your diet. °· Avoid heavy lifting, wear low heal shoes, and practice good posture. °· Rest a lot with your legs elevated if you have leg cramps or low back pain. °· Visit your dentist if you have not gone during your pregnancy. Use a soft toothbrush to brush your teeth and be gentle when you floss. °· A sexual relationship may be continued unless your caregiver directs you otherwise. °· Do not travel far distances unless it is absolutely necessary and only with the approval of your caregiver. °· Take prenatal classes to understand, practice, and ask questions about the labor and delivery. °· Make a trial run to the hospital. °· Pack your hospital bag. °· Prepare the baby's nursery. °· Continue to go to all your prenatal visits as directed by your caregiver. °SEEK MEDICAL CARE IF: °· You are unsure if you are in labor or if your water has broken. °· You have dizziness. °· You have   mild pelvic cramps, pelvic pressure, or nagging pain in your abdominal area. °· You have persistent nausea, vomiting, or diarrhea. °· You have a bad smelling vaginal discharge. °· You have pain with urination. °SEEK IMMEDIATE MEDICAL CARE IF:  °· You have a fever. °· You are leaking fluid from your vagina. °· You have spotting or bleeding from your vagina. °· You have severe abdominal cramping or pain. °· You have rapid weight loss or gain. °· You have shortness of breath with chest pain. °· You notice sudden or extreme swelling of your face, hands, ankles, feet, or legs. °· You have not felt your baby move in over an hour. °· You have severe headaches that do not go away with medicine. °· You have vision changes. °  °This information is not intended to replace advice given to you by your health care provider. Make sure you discuss any questions you have with your health care provider. °  °Document Released: 08/28/2001 Document  Revised: 09/24/2014 Document Reviewed: 11/04/2012 °[employer]vier Interactive Patient Education ©2016 [employer] °Fetal Movement Counts °Patient Name: __________________________________________________ Patient Due Date: ____________________ °Performing a fetal movement count is highly recommended in high-risk pregnancies, but it is good for every pregnant woman to do. Your health care provider may ask you to start counting fetal movements at 28 weeks of the pregnancy. Fetal movements often increase: °· After eating a full meal. °· After physical activity. °· After eating or drinking something sweet or cold. °· At rest. °Pay attention to when you feel the baby is most active. This will help you notice a pattern of your baby's sleep and wake cycles and what factors contribute to an increase in fetal movement. It is important to perform a fetal movement count at the same time each day when your baby is normally most active.  °HOW TO COUNT FETAL MOVEMENTS °· Find a quiet and comfortable area to sit or lie down on your left side. Lying on your left side provides the best blood and oxygen circulation to your baby. °· Write down the day and time on a sheet of paper or in a journal. °· Start counting kicks, flutters, swishes, rolls, or jabs in a 2-hour period. You should feel at least 10 movements within 2 hours. °· If you do not feel 10 movements in 2 hours, wait 2-3 hours and count again. Look for a change in the pattern or not enough counts in 2 hours. °SEEK MEDICAL CARE IF: °· You feel less than 10 counts in 2 hours, tried twice. °· There is no movement in over an hour. °· The pattern is changing or taking longer each day to reach 10 counts in 2 hours. °· You feel the baby is not moving as he or she usually does. °Date: ____________ Movements: ____________ Start time: ____________ Finish time: ____________  °Date: ____________ Movements: ____________ Start time: ____________ Finish time: ____________ °Date: ____________  Movements: ____________ Start time: ____________ Finish time: ____________ °Date: ____________ Movements: ____________ Start time: ____________ Finish time: ____________ °Date: ____________ Movements: ____________ Start time: ____________ Finish time: ____________ °Date: ____________ Movements: ____________ Start time: ____________ Finish time: ____________ °Date: ____________ Movements: ____________ Start time: ____________ Finish time: ____________ °Date: ____________ Movements: ____________ Start time: ____________ Finish time: ____________  °Date: ____________ Movements: ____________ Start time: ____________ Finish time: ____________ °Date: ____________ Movements: ____________ Start time: ____________ Finish time: ____________ °Date: ____________ Movements: ____________ Start time: ____________ Finish time: ____________ °Date: ____________ Movements: ____________ Start time: ____________ Finish time: ____________ °Date:   ____________ Movements: ____________ Start time: ____________ Finish time: ____________ °Date: ____________ Movements: ____________ Start time: ____________ Finish time: ____________ °Date: ____________ Movements: ____________ Start time: ____________ Finish time: ____________  °Date: ____________ Movements: ____________ Start time: ____________ Finish time: ____________ °Date: ____________ Movements: ____________ Start time: ____________ Finish time: ____________ °Date: ____________ Movements: ____________ Start time: ____________ Finish time: ____________ °Date: ____________ Movements: ____________ Start time: ____________ Finish time: ____________ °Date: ____________ Movements: ____________ Start time: ____________ Finish time: ____________ °Date: ____________ Movements: ____________ Start time: ____________ Finish time: ____________ °Date: ____________ Movements: ____________ Start time: ____________ Finish time: ____________  °Date: ____________ Movements: ____________ Start time:  ____________ Finish time: ____________ °Date: ____________ Movements: ____________ Start time: ____________ Finish time: ____________ °Date: ____________ Movements: ____________ Start time: ____________ Finish time: ____________ °Date: ____________ Movements: ____________ Start time: ____________ Finish time: ____________ °Date: ____________ Movements: ____________ Start time: ____________ Finish time: ____________ °Date: ____________ Movements: ____________ Start time: ____________ Finish time: ____________ °Date: ____________ Movements: ____________ Start time: ____________ Finish time: ____________  °Date: ____________ Movements: ____________ Start time: ____________ Finish time: ____________ °Date: ____________ Movements: ____________ Start time: ____________ Finish time: ____________ °Date: ____________ Movements: ____________ Start time: ____________ Finish time: ____________ °Date: ____________ Movements: ____________ Start time: ____________ Finish time: ____________ °Date: ____________ Movements: ____________ Start time: ____________ Finish time: ____________ °Date: ____________ Movements: ____________ Start time: ____________ Finish time: ____________ °Date: ____________ Movements: ____________ Start time: ____________ Finish time: ____________  °Date: ____________ Movements: ____________ Start time: ____________ Finish time: ____________ °Date: ____________ Movements: ____________ Start time: ____________ Finish time: ____________ °Date: ____________ Movements: ____________ Start time: ____________ Finish time: ____________ °Date: ____________ Movements: ____________ Start time: ____________ Finish time: ____________ °Date: ____________ Movements: ____________ Start time: ____________ Finish time: ____________ °Date: ____________ Movements: ____________ Start time: ____________ Finish time: ____________ °Date: ____________ Movements: ____________ Start time: ____________ Finish time: ____________  °Date:  ____________ Movements: ____________ Start time: ____________ Finish time: ____________ °Date: ____________ Movements: ____________ Start time: ____________ Finish time: ____________ °Date: ____________ Movements: ____________ Start time: ____________ Finish time: ____________ °Date: ____________ Movements: ____________ Start time: ____________ Finish time: ____________ °Date: ____________ Movements: ____________ Start time: ____________ Finish time: ____________ °Date: ____________ Movements: ____________ Start time: ____________ Finish time: ____________ °Date: ____________ Movements: ____________ Start time: ____________ Finish time: ____________  °Date: ____________ Movements: ____________ Start time: ____________ Finish time: ____________ °Date: ____________ Movements: ____________ Start time: ____________ Finish time: ____________ °Date: ____________ Movements: ____________ Start time: ____________ Finish time: ____________ °Date: ____________ Movements: ____________ Start time: ____________ Finish time: ____________ °Date: ____________ Movements: ____________ Start time: ____________ Finish time: ____________ °Date: ____________ Movements: ____________ Start time: ____________ Finish time: ____________ °  °This information is not intended to replace advice given to you by your health care provider. Make sure you discuss any questions you have with your health care provider. °  °Document Released: 10/03/2006 Document Revised: 09/24/2014 Document Reviewed: 06/30/2012 °[employer] Interactive Patient Education ©2016 [employer] °Braxton Hicks Contractions °Contractions of the uterus can occur throughout pregnancy. Contractions are not always a sign that you are in labor.  °WHAT ARE BRAXTON HICKS CONTRACTIONS?  °Contractions that occur before labor are called Braxton Hicks contractions, or false labor. Toward the end of pregnancy (32-34 weeks), these contractions can develop more often and may become more  forceful. This is not true labor because these contractions do not result in opening (dilatation) and thinning of the cervix. They are sometimes difficult to tell apart from true labor because these contractions can be forceful and people have different pain tolerances. You should   not feel embarrassed if you go to the hospital with false labor. Sometimes, the only way to tell if you are in true labor is for your health care provider to look for changes in the cervix. °If there are no prenatal problems or other health problems associated with the pregnancy, it is completely safe to be sent home with false labor and await the onset of true labor. °HOW CAN YOU TELL THE DIFFERENCE BETWEEN TRUE AND FALSE LABOR? °False Labor °· The contractions of false labor are usually shorter and not as hard as those of true labor.   °· The contractions are usually irregular.   °· The contractions are often felt in the front of the lower abdomen and in the groin.   °· The contractions may go away when you walk around or change positions while lying down.   °· The contractions get weaker and are shorter lasting as time goes on.   °· The contractions do not usually become progressively stronger, regular, and closer together as with true labor.   °True Labor °· Contractions in true labor last 30-70 seconds, become very regular, usually become more intense, and increase in frequency.   °· The contractions do not go away with walking.   °· The discomfort is usually felt in the top of the uterus and spreads to the lower abdomen and low back.   °· True labor can be determined by your health care provider with an exam. This will show that the cervix is dilating and getting thinner.   °WHAT TO REMEMBER °· Keep up with your usual exercises and follow other instructions given by your health care provider.   °· Take medicines as directed by your health care provider.   °· Keep your regular prenatal appointments.   °· Eat and drink lightly if you  think you are going into labor.   °· If Braxton Hicks contractions are making you uncomfortable:   °· Change your position from lying down or resting to walking, or from walking to resting.   °· Sit and rest in a tub of warm water.   °· Drink 2-3 glasses of water. Dehydration may cause these contractions.   °· Do slow and deep breathing several times an hour.   °WHEN SHOULD I SEEK IMMEDIATE MEDICAL CARE? °Seek immediate medical care if: °· Your contractions become stronger, more regular, and closer together.   °· You have fluid leaking or gushing from your vagina.   °· You have a fever.   °· You pass blood-tinged mucus.   °· You have vaginal bleeding.   °· You have continuous abdominal pain.   °· You have low back pain that you never had before.   °· You feel your baby's head pushing down and causing pelvic pressure.   °· Your baby is not moving as much as it used to.   °  °This information is not intended to replace advice given to you by your health care provider. Make sure you discuss any questions you have with your health care provider. °  °Document Released: 09/03/2005 Document Revised: 09/08/2013 Document Reviewed: 06/15/2013 °[employer]vier Interactive Patient Education ©2016 [employer] ° °

## 2015-11-16 NOTE — Progress Notes (Signed)
Patient ID: Danielle Morgan, female   DOB: 01-22-1996, 20 y.o.   MRN: [id number] Pushing well  Filed Vitals:   11/16/15 1601 11/16/15 1630 11/16/15 1650 11/16/15 1701  BP: 123/79 142/89  137/83  Pulse: 119 121  112  Temp:   99.7 F (37.6 C)   TempSrc:   Axillary   Resp:   18   Height:      Weight:      SpO2:       FHR reassuring UCs every 1.5 min  Dilation: 10 Dilation Complete Date: 11/16/15 Dilation Complete Time: 1631 Effacement (%): 100 Station: +1, +2 Presentation: Vertex Exam by:: k fields, rn  Anticipate SVD

## 2015-11-16 NOTE — Progress Notes (Signed)
Patient ID: Danielle Morgan, female   DOB: 02-12-1996, 20 y.o.   MRN: [id number] Doing better with second epidural placed  Filed Vitals:   11/16/15 0845 11/16/15 0850 11/16/15 0855 11/16/15 0859  BP: 134/70 120/62 123/68   Pulse: 147 111 105   Temp:      TempSrc:      Resp: Height:      Weight:      SpO2: 99%      FHR reassuring UCs regular  Dilation: 6 Effacement (%): 90 Station: -2 Presentation: Vertex Exam by:: k fields, rn  Will continue to observe

## 2015-11-16 NOTE — Consults (Signed)
  Anesthesia Pain Consult Note  Patient: Danielle Morgan, 20 y.o., female  Consult Requested by: Lesly Dukes, MD  Reason for Consult: Comfort Level and Expectations during Labor and Delivery. Initial Assessment.:  The patient had an epidural placed at about 0630 this morning but has never been comfortable on her left side. Epidural removed and replaced by Dr. Malen Gauze at about [phone].  The patient obtained immediate relief of pain on her left side and remains comfortable at reassessment 30 minutes later.Sensory leel to Cold T-7 on right & T-9 on left.   Pain GOAL = 5  Current Self  Assessment = 1     Danielle Morgan 11/16/2015

## 2015-11-16 NOTE — Progress Notes (Signed)
Report given of unchanged cervical exam. Will discharge home

## 2015-11-16 NOTE — Anesthesia Preprocedure Evaluation (Signed)
Anesthesia Evaluation  Patient identified by MRN, date of birth, ID band Patient awake    Reviewed: Allergy & Precautions, NPO status , Patient's Chart, lab work & pertinent test results  Airway Mallampati: II  TM Distance: >3 FB Neck ROM: Full    Dental  (+) Teeth Intact   Pulmonary neg pulmonary ROS,    breath sounds clear to auscultation       Cardiovascular negative cardio ROS   Rhythm:Regular Rate:Normal     Neuro/Psych  Headaches, negative psych ROS   GI/Hepatic negative GI ROS, Neg liver ROS,   Endo/Other  negative endocrine ROS  Renal/GU negative Renal ROS  negative genitourinary   Musculoskeletal negative musculoskeletal ROS (+)   Abdominal   Peds negative pediatric ROS (+)  Hematology negative hematology ROS (+)   Anesthesia Other Findings   Reproductive/Obstetrics negative OB ROS                             Lab Results  Component Value Date   WBC 16.0* 11/16/2015   HGB 10.0* 11/16/2015   HCT 31.6* 11/16/2015   MCV 75.6* 11/16/2015   PLT 309 11/16/2015   No results found for: INR, PROTIME   Anesthesia Physical Anesthesia Plan  ASA: II  Anesthesia Plan: Epidural   Post-op Pain Management:    Induction:   Airway Management Planned:   Additional Equipment:   Intra-op Plan:   Post-operative Plan:   Informed Consent: I have reviewed the patients History and Physical, chart, labs and discussed the procedure including the risks, benefits and alternatives for the proposed anesthesia with the patient or authorized representative who has indicated his/her understanding and acceptance.     Plan Discussed with:   Anesthesia Plan Comments:         Anesthesia Quick Evaluation

## 2015-11-16 NOTE — H&P (Signed)
Danielle Morgan is a 20 y.o. female G2P0010 pt of [place] presenting for contractions, worsening since onset this morning and since discharge from MAU earlier tonight.  She reports good fetal movement, denies LOF, vaginal bleeding, vaginal itching/burning, urinary symptoms, h/a, dizziness, n/v, or fever/chills.    Clinic  [facility] ([facility]) Prenatal Labs  Dating LMP + 8 wks [place] Blood type: --/--/A POS (05/05 2110)   Genetic Screen declined Antibody: Neg  Anatomic [place] normal Rubella:  Immune  GTT  Third trimester: 105 RPR:   NR  Flu vaccine 07/12/15 HBsAg:   Neg  TDaP vaccine 09/09/15                                        HIV:   NR  Baby Food Breast                                             GBS: positive  Contraception OCPs vs Nexplanon Pap: Not indicated due to age  Circumcision Yes   Pediatrician [facility]   Support Person FOB - Iantha Fallen     Maternal Medical History:  Reason for admission: Contractions.  Nausea.  Contractions: Onset was 6-12 hours ago.   Frequency: regular.   Duration is approximately 1 minute.   Perceived severity is strong.    Fetal activity: Perceived fetal activity is normal.   Last perceived fetal movement was within the past hour.    Prenatal complications: no prenatal complications Prenatal Complications - Diabetes: none.    OB History    Gravida Para Term Preterm AB TAB SAB Ectopic Multiple Living   Past Medical History  Diagnosis Date  . Frequent headaches    Past Surgical History  Procedure Laterality Date  . No past surgeries     Family History: family history includes ADD / ADHD in her brother and sister; Alcohol abuse in her maternal grandfather; Arthritis in her maternal grandfather and mother; Bipolar disorder in her father; Cancer in her paternal grandmother; Heart disease in her maternal uncle; Hypertension in her maternal grandfather. Social History:  reports that she has never smoked. She has never used  smokeless tobacco. She reports that she does not drink alcohol or use illicit drugs.   Prenatal Transfer Tool  Maternal Diabetes: No Genetic Screening: Declined Maternal Ultrasounds/Referrals: Normal Fetal Ultrasounds or other Referrals:  None Maternal Substance Abuse:  No Significant Maternal Medications:  None Significant Maternal Lab Results:  Lab values include: Group B Strep positive Other Comments:  None  Review of Systems  Constitutional: Negative for fever, chills and malaise/fatigue.  Eyes: Negative for blurred vision.  Respiratory: Negative for cough and shortness of breath.   Cardiovascular: Negative for chest pain.  Gastrointestinal: Positive for abdominal pain. Negative for heartburn, nausea and vomiting.  Genitourinary: Negative for dysuria, urgency and frequency.  Musculoskeletal: Negative.   Neurological: Negative for dizziness and headaches.  Psychiatric/Behavioral: Negative for depression.    Dilation: 4.5 Effacement (%): 90 Station: -2 Exam by:: Lynnette Weston,RN Last menstrual period 02/21/2015, unknown if currently breastfeeding. Maternal Exam:  Uterine Assessment: Contraction strength is moderate.  Contraction duration is 80 seconds. Contraction frequency is regular.   Abdomen: Fetal presentation: vertex  Cervix:  Cervix evaluated by digital exam.     Fetal Exam Fetal Monitor Review: Mode: ultrasound.   Baseline rate: 135.  Variability: moderate (6-25 bpm).   Pattern: accelerations present and no decelerations.    Fetal State Assessment: Category I - tracings are normal.     Physical Exam  Nursing note and vitals reviewed. Constitutional: She is oriented to person, place, and time. She appears well-developed and well-nourished.  Neck: Normal range of motion.  Cardiovascular: Normal rate, regular rhythm and normal heart sounds.   Respiratory: Effort normal and breath sounds normal.  GI: Soft.  Musculoskeletal: Normal range of motion.   Neurological: She is alert and oriented to person, place, and time.  Skin: Skin is warm and dry.  Psychiatric: She has a normal mood and affect. Her behavior is normal. Judgment and thought content normal.    Prenatal labs: ABO, Rh: A/POS/-- (08/09 1053) Antibody: NEG (08/09 1053) Rubella: 1.95 (08/09 1053) RPR: NON REAC (12/23 0925)  HBsAg: NEGATIVE (08/09 1053)  HIV: NONREACTIVE (12/23 0925)  GBS:   Positive  Assessment/Plan: G2P0010   Active labor at term GBS positive Elevated BP in pregnancy  Admit to [facility] for GBS prophylaxis Preeclampsia labs drawn May have epidural when desired Anticipate NSVD   LEFTWICH-KIRBY, Shaneka Efaw 11/16/2015, 5:40 AM

## 2015-11-16 NOTE — MAU Note (Signed)
Pt returns with worsening contractions.  

## 2015-11-16 NOTE — Lactation Note (Signed)
This note was copied from a baby's chart. Lactation Consultation Note  Patient Name: Danielle Morgan Today's Date: 11/16/2015 Reason for consult: Initial assessment Baby at 2 hr of life and mom reports bf is going well. Denies breast or nipple pain, no concerns voiced. Demonstrated manual expression, colostrum noted bilaterally, spoon in room. Discussed baby behavior, feeding frequency, baby belly size, voids, wt loss, breast changes, and nipple care. Given lactation handouts. Aware of OP services and support group.     Maternal Data Has patient been taught Hand Expression?: Yes Does the patient have breastfeeding experience prior to this delivery?: No  Feeding Feeding Type: Breast Fed Length of feed: 15 min  LATCH Score/Interventions Latch: Grasps breast easily, tongue down, lips flanged, rhythmical sucking.  Audible Swallowing: Spontaneous and intermittent  Type of Nipple: Everted at rest and after stimulation  Comfort (Breast/Nipple): Soft / non-tender     Hold (Positioning): No assistance needed to correctly position infant at breast.  LATCH Score: 10  Lactation Tools Discussed/Used WIC Program: Yes   Consult Status Consult Status: Follow-up Date: 11/17/15 Follow-up type: In-patient    Rulon Eisenmenger 11/16/2015, 8:49 PM

## 2015-11-16 NOTE — Progress Notes (Signed)
Patient ID: Danielle Morgan, female   DOB: Oct 06, 1995, 20 y.o.   MRN: [id number] Doing well, feeling some pressure  Filed Vitals:   11/16/15 1300 11/16/15 1301 11/16/15 1331 11/16/15 1401  BP:  134/71 101/52 139/81  Pulse:  136 139 128  Temp:   98.9 F (37.2 C)   TempSrc:   Axillary   Resp: 18  18   Height:      Weight:      SpO2:        FHR reassuring UCs frequent every 1-2 min  Dilation: 9 Effacement (%): 100 Station: +1 Presentation: Vertex Exam by:: k fields, rn  Will anticipate second stage soon.

## 2015-11-16 NOTE — Progress Notes (Signed)
Patient ID: Danielle Morgan, female   DOB: 08-Aug-1996, 20 y.o.   MRN: [id number] FHR up to 180 Temp rising to 99  FHR otherwise reassuring Pushing well  Consulted Dr Despina Hidden WIll wait for temp > 100.5 to give antibiotics

## 2015-11-17 NOTE — Anesthesia Postprocedure Evaluation (Signed)
Anesthesia Post Note  Patient: Danielle Morgan  Procedure(s) Performed: * No procedures listed *  Patient location during evaluation: Mother Baby Anesthesia Type: Epidural Level of consciousness: awake, awake and alert and oriented Pain management: pain level controlled Vital Signs Assessment: post-procedure vital signs reviewed and stable Respiratory status: spontaneous breathing Cardiovascular status: stable Postop Assessment: patient able to bend at knees, no signs of nausea or vomiting and adequate PO intake Anesthetic complications: no    Last Vitals:  Filed Vitals:   11/17/15 0130 11/17/15 0639  BP: 130/75 125/75  Pulse: 91 74  Temp: 36.9 C 36.4 C  Resp: 18 18    Last Pain:  Filed Vitals:   11/17/15 1437  PainSc: 4                  Jerime Arif Hristova

## 2015-11-17 NOTE — Progress Notes (Signed)
**   see below for official documentation Post Partum Day #1 Subjective: no complaints, up ad lib, voiding, tolerating PO and + flatus  Objective: Blood pressure 125/75, pulse 74, temperature 97.5 F (36.4 C), temperature source Oral, resp. rate 18, height  (1.651 m), weight 89.812 kg (198 lb), last menstrual period 02/21/2015, SpO2 99 %, unknown if currently breastfeeding.  Physical Exam:  General: alert, cooperative, appears stated age, fatigued and no distress Lochia: deferred Uterine Fundus: firm Incision: NA DVT Evaluation: No evidence of DVT seen on physical exam. Negative Homan's sign. No cords or calf tenderness. No significant calf/ankle edema.   Recent Labs  11/16/15 0545  HGB 10.0*  HCT 31.6*    Assessment/Plan: Plan for discharge tomorrow and Contraception Nexplanon vs. OCP   LOS: 1 day   Danielle Morgan 11/17/2015, 9:23 AM   OB fellow attestation Post Partum Day 1 I have seen and examined this patient and agree with above documentation in the PA student's note.   Danielle Morgan is a 20 y.o. G2P1011 s/p NSVD.  Pt denies problems with ambulating, voiding or po intake. Pain is well controlled.   Feeling well today  PE:  BP 141/84 mmHg  Pulse 78  Temp(Src) 98.3 F (36.8 C) (Oral)  Resp 18  Ht  (1.651 m)  Wt 198 lb (89.812 kg)  BMI 32.95 kg/m2  SpO2 99%  LMP 02/21/2015 (Exact Date)  Breastfeeding? Unknown Gen: well appearing Heart: reg rate Lungs: normal WOB Fundus firm Ext: soft, no pain, no edema  Plan for discharge: PPD#2, continue routine pp care. Follow up on periurethral tears in the outpatient setting. She remains undecided for contraception  Federico Flake, MD 9:10 AM

## 2015-11-17 NOTE — Discharge Instructions (Signed)

## 2015-11-17 NOTE — Discharge Summary (Signed)
OB Discharge Summary     Patient Name: Danielle Morgan DOB: 01/07/1996 MRN: [id number]  Date of admission: 11/16/2015 Delivering MD: Aviva Signs   Date of discharge: 11/18/2015  Admitting diagnosis: 38 WEEKS CTX Intrauterine pregnancy: [redacted]w[redacted]d     Secondary diagnosis:  Active Problems:   Active labor at term  Additional problems: borderline GHTN, normal BP after delivery     Discharge diagnosis: Term Pregnancy Delivered                                                                                                Post partum procedures:none  Augmentation: AROM  Complications: None  Hospital course:  Onset of Labor With Vaginal Delivery     20 y.o. yo G2P1011 at [redacted]w[redacted]d was admitted in Active Labor on 11/16/2015. Patient had an uncomplicated labor course as follows:  Membrane Rupture Time/Date: 1:08 PM ,11/16/2015   Intrapartum Procedures: Episiotomy: None [1]                                         Lacerations:  Periurethral [8]  Patient had a delivery of a Viable infant. 11/16/2015  Information for the patient's newborn:  Malaisha, Silliman [[id number]]  Delivery Method: Vaginal, Spontaneous Delivery (Filed from Delivery Summary)     Pateint had an uncomplicated postpartum course.  She is ambulating, tolerating a regular diet, passing flatus, and urinating well. Patient is discharged home in stable condition on 11/18/2015.    Physical exam  Filed Vitals:   11/17/15 1300 11/17/15 1724 11/17/15 1821 11/18/15 0545  BP: 134/76 132/81 131/75 141/84  Pulse: 103 86 83 78  Temp:   97.9 F (36.6 C) 98.3 F (36.8 C)  TempSrc:   Oral Oral  Resp:   18 18  Height:      Weight:      SpO2:       General: alert, cooperative and no distress Lochia: appropriate Uterine Fundus: firm Incision: N/A DVT Evaluation: No evidence of DVT seen on physical exam. Labs: Lab Results  Component Value Date   WBC 16.0* 11/16/2015   HGB 10.0* 11/16/2015   HCT 31.6* 11/16/2015   MCV 75.6*  11/16/2015   PLT 309 11/16/2015   CMP Latest Ref Rng 11/16/2015  Glucose 65 - 99 mg/dL 782(N)  BUN 6 - 20 mg/dL 10  Creatinine 5.62 - 1.30 mg/dL 8.65  Sodium 784 - 696 mmol/L 134(L)  Potassium 3.5 - 5.1 mmol/L 3.9  Chloride 101 - 111 mmol/L 105  CO2 22 - 32 mmol/L 18(L)  Calcium 8.9 - 10.3 mg/dL 9.2  Total Protein 6.5 - 8.1 g/dL 7.0  Total Bilirubin 0.3 - 1.2 mg/dL 0.5  Alkaline Phos 38 - 126 U/L 134(H)  AST 15 - 41 U/L 27  ALT 14 - 54 U/L 14    Discharge instruction: per After Visit Summary and "Baby and Me Booklet".  After visit meds:    Medication List    ASK your doctor about these medications  CONCEPT OB 130-92.4-1 MG Caps  Take 1 capsule by mouth daily.     cyclobenzaprine 10 MG tablet  Commonly known as:  FLEXERIL  Take 1 tablet (10 mg total) by mouth every 8 (eight) hours as needed for muscle spasms.        Diet: routine diet  Activity: Advance as tolerated. Pelvic rest for 6 weeks.   Outpatient follow up: Follow up Appt:make an appointment for 4-6 weeks for postpartum checkup  Follow up Visit:No Follow-up on file.  Postpartum contraception: OCP's vs Nexplanon  Newborn Data: Live born female  Birth Weight: 7 lb 5.8 oz (3340 g) APGAR: 8, 9  Baby Feeding: Breast Disposition:home with mother   11/18/2015 Greig Right, CNM

## 2015-11-17 NOTE — Lactation Note (Signed)
This note was copied from a baby's chart. Lactation Consultation Note Called to room by RN, Ashely to verify swallows.  Baby latching on right breast in football hold with several audible swallows.  Baby getting more sleepy and made some air sucking sounds and pulled off nipple and burped.  Baby satisfied with feeding and remains STS.  Moms nipple slightly compressible possible from end of feeding, mom to express colostrum and rub onto nipples.  Mom will continue to call RN for latch assist as needed.     Patient Name: Danielle Morgan Today's Date: 11/17/2015     Maternal Data    Feeding    LATCH Score/Interventions                      Lactation Tools Discussed/Used     Consult Status      Shoptaw, Arvella Merles 11/17/2015, 5:04 PM

## 2015-11-17 NOTE — Lactation Note (Signed)
This note was copied from a baby's chart. Lactation Consultation Note  Patient Name: Danielle Morgan ZOXWR'U Date: 11/17/2015 Reason for consult: Follow-up assessment  MD requested LC assist as Mom having difficulty latching baby.  Baby 16 hrs old, and starting to root more.  Mom holding baby in cross cradle hold, without any pillow support, and hunched over baby.  Readjusted positioning so Mom is supported and upright, pillows placed behind Mom's back, and in her lap to support infant at nipple height.  Assisted Mom to support baby's head and her breast using a U hold on her breast, after manually expressing colostrum.  Baby latched on quickly and easily following a wide open gape of mouth.  Mom denies any discomfort with latch, and feels only a tug.  Multiple swallowing heard.  Reviewed basics and encouraged skin to skin, and cue based feedings.  To call prn for assistance with latch, and LC to follow up daily.  Baby to stay for 48 hrs due to elevated temp at first.   Consult Status Consult Status: Follow-up Date: 11/18/15 Follow-up type: In-patient    Judee Clara 11/17/2015, 10:24 AM

## 2015-11-18 MED ORDER — ACETAMINOPHEN 325 MG PO TABS: 650.0000 mg | ORAL_TABLET | ORAL | Status: DC | PRN

## 2015-11-18 MED ORDER — IBUPROFEN 600 MG PO TABS: 600.0000 mg | ORAL_TABLET | Freq: Four times a day (QID) | ORAL | Status: DC

## 2015-11-18 NOTE — Lactation Note (Signed)
This note was copied from a baby's chart. Lactation Consultation Note  Assisted mom with positioning and dad assisted with breast compression. Many swallows were heard. Hand expression taught with colostrum easily expressible. Follow-up planned.  Patient Name: Danielle Morgan ZOXWR'UToday's Date: 11/18/2015 Reason for consult: Follow-up assessment   Maternal Data Has patient been taught Hand Expression?: Yes  Feeding Feeding Type: Breast Fed Length of feed: 25 min  LATCH Score/Interventions Latch: Repeated attempts needed to sustain latch, nipple held in mouth throughout feeding, stimulation needed to elicit sucking reflex.  Audible Swallowing: A few with stimulation  Type of Nipple: Flat  Comfort (Breast/Nipple): Soft / non-tender     Hold (Positioning): Assistance needed to correctly position infant at breast and maintain latch.  LATCH Score: 6  Lactation Tools Discussed/Used     Consult Status Consult Status: Follow-up Date: 11/19/15 Follow-up type: In-patient    Danielle DryerJoseph, Danielle Morgan 11/18/2015, 11:23 AM

## 2015-11-19 ENCOUNTER — Ambulatory Visit: Payer: Self-pay

## 2015-11-19 NOTE — Lactation Note (Signed)
This note was copied from a baby's chart. Lactation Consultation Note  Patient Name: Danielle Morgan ZOXWR'UToday's Date: 11/19/2015 Reason for consult: Follow-up assessment;Hyperbilirubinemia Photo therapy d/c's this am, re-checking bili at 1400. FOB spoon feeding some pumped breast milk to baby. Mom reports her breasts are filling. Demonstrated to parents how to use foley cup to supplement instead of spoon. Baby a little fussy with cup so Mom re-latched baby. Due to Mom's breast filling, the nipple is flat. Baby is having difficulty sustaining good depth with the breast fullness. Mom re-latched baby and LC demonstrated to Mom how to use 5 fr feeding tube/syringe at breast to supplement when needed.  Baby took the supplement well. LC encouraged Mom to pre-pump to help with latch. Mom breasts are full but not engorged. Mom reports breast soften with baby nursing. Advised baby should be at the breast 8-12 times or more in 24 hours. Pre-pump to help with latch, keep baby nursing 15-20 minutes both breast when possible or longer if baby desires. If breast soft, and baby satisfied at breast, then Mom does not need to post pump. However if breasts still feel more full than Mom is comfortable, then post pump to comfort and apply ice packs to prevent/treat engorgement. Give baby back any amount of EBM received.  Baby has been to breast 8 times in 24 hours for 10-30 minutes, baby now 65 hours old, 7% weight loss, good output. LC left phone number for Mom to call with next feeding to assist Mom obtaining more depth with latch.   Maternal Data    Feeding Feeding Type: Breast Milk Length of feed: 10 min  LATCH Score/Interventions Latch: Grasps breast easily, tongue down, lips flanged, rhythmical sucking.  Audible Swallowing: Spontaneous and intermittent  Type of Nipple: Flat Intervention(s): Hand pump  Comfort (Breast/Nipple): Filling, red/small blisters or bruises, mild/mod discomfort  Problem noted:  Filling Interventions (Filling): Hand pump  Hold (Positioning): Assistance needed to correctly position infant at breast and maintain latch. Intervention(s): Breastfeeding basics reviewed;Support Pillows;Position options;Skin to skin  LATCH Score: 7  Lactation Tools Discussed/Used Tools: 24F feeding tube / Syringe Breast pump type: Manual   Consult Status Consult Status: Follow-up Date: 11/19/15 Follow-up type: In-patient    Alfred LevinsGranger, Charvez Voorhies Ann 11/19/2015, 12:47 PM

## 2015-11-21 ENCOUNTER — Encounter: Payer: BLUE CROSS/BLUE SHIELD | Admitting: Obstetrics and Gynecology

## 2015-11-28 ENCOUNTER — Encounter: Payer: BLUE CROSS/BLUE SHIELD | Admitting: Family Medicine

## 2015-12-27 ENCOUNTER — Encounter: Payer: Self-pay | Admitting: *Deleted

## 2015-12-27 ENCOUNTER — Ambulatory Visit (INDEPENDENT_AMBULATORY_CARE_PROVIDER_SITE_OTHER): Payer: BLUE CROSS/BLUE SHIELD | Admitting: Family Medicine

## 2015-12-27 ENCOUNTER — Encounter: Payer: Self-pay | Admitting: Family Medicine

## 2015-12-27 DIAGNOSIS — Z30018 Encounter for initial prescription of other contraceptives: Secondary | ICD-10-CM

## 2015-12-27 DIAGNOSIS — Z01812 Encounter for preprocedural laboratory examination: Secondary | ICD-10-CM

## 2015-12-27 DIAGNOSIS — Z30017 Encounter for initial prescription of implantable subdermal contraceptive: Secondary | ICD-10-CM

## 2015-12-27 MED ORDER — ETONOGESTREL 68 MG ~~LOC~~ IMPL
68.0000 mg | DRUG_IMPLANT | Freq: Once | SUBCUTANEOUS | Status: AC
  Administered 2015-12-27: 68 mg via SUBCUTANEOUS

## 2015-12-27 NOTE — Patient Instructions (Signed)
Etonogestrel implant What is this medicine? ETONOGESTREL (et oh noe JES trel) is a contraceptive (birth control) device. It is used to prevent pregnancy. It can be used for up to 3 years. This medicine may be used for other purposes; ask your health care provider or pharmacist if you have questions. What should I tell my health care provider before I take this medicine? They need to know if you have any of these conditions: -abnormal vaginal bleeding -blood vessel disease or blood clots -cancer of the breast, cervix, or liver -depression -diabetes -gallbladder disease -headaches -heart disease or recent heart attack -high blood pressure -high cholesterol -kidney disease -liver disease -renal disease -seizures -tobacco smoker -an unusual or allergic reaction to etonogestrel, other hormones, anesthetics or antiseptics, medicines, foods, dyes, or preservatives -pregnant or trying to get pregnant -breast-feeding How should I use this medicine? This device is inserted just under the skin on the inner side of your upper arm by a health care professional. Talk to your pediatrician regarding the use of this medicine in children. Special care may be needed. Overdosage: If you think you have taken too much of this medicine contact a poison control center or emergency room at once. NOTE: This medicine is only for you. Do not share this medicine with others. What if I miss a dose? This does not apply. What may interact with this medicine? Do not take this medicine with any of the following medications: -amprenavir -bosentan -fosamprenavir This medicine may also interact with the following medications: -barbiturate medicines for inducing sleep or treating seizures -certain medicines for fungal infections like ketoconazole and itraconazole -griseofulvin -medicines to treat seizures like carbamazepine, felbamate, oxcarbazepine, phenytoin,  topiramate -modafinil -phenylbutazone -rifampin -some medicines to treat HIV infection like atazanavir, indinavir, lopinavir, nelfinavir, tipranavir, ritonavir -St. John's wort This list may not describe all possible interactions. Give your health care provider a list of all the medicines, herbs, non-prescription drugs, or dietary supplements you use. Also tell them if you smoke, drink alcohol, or use illegal drugs. Some items may interact with your medicine. What should I watch for while using this medicine? This product does not protect you against HIV infection (AIDS) or other sexually transmitted diseases. You should be able to feel the implant by pressing your fingertips over the skin where it was inserted. Contact your doctor if you cannot feel the implant, and use a non-hormonal birth control method (such as condoms) until your doctor confirms that the implant is in place. If you feel that the implant may have broken or become bent while in your arm, contact your healthcare provider. What side effects may I notice from receiving this medicine? Side effects that you should report to your doctor or health care professional as soon as possible: -allergic reactions like skin rash, itching or hives, swelling of the face, lips, or tongue -breast lumps -changes in emotions or moods -depressed mood -heavy or prolonged menstrual bleeding -pain, irritation, swelling, or bruising at the insertion site -scar at site of insertion -signs of infection at the insertion site such as fever, and skin redness, pain or discharge -signs of pregnancy -signs and symptoms of a blood clot such as breathing problems; changes in vision; chest pain; severe, sudden headache; pain, swelling, warmth in the leg; trouble speaking; sudden numbness or weakness of the face, arm or leg -signs and symptoms of liver injury like dark yellow or brown urine; general ill feeling or flu-like symptoms; light-colored stools; loss of  appetite; nausea; right upper belly   pain; unusually weak or tired; yellowing of the eyes or skin -unusual vaginal bleeding, discharge -signs and symptoms of a stroke like changes in vision; confusion; trouble speaking or understanding; severe headaches; sudden numbness or weakness of the face, arm or leg; trouble walking; dizziness; loss of balance or coordination Side effects that usually do not require medical attention (Report these to your doctor or health care professional if they continue or are bothersome.): -acne -back pain -breast pain -changes in weight -dizziness -general ill feeling or flu-like symptoms -headache -irregular menstrual bleeding -nausea -sore throat -vaginal irritation or inflammation This list may not describe all possible side effects. Call your doctor for medical advice about side effects. You may report side effects to FDA at 1-800-FDA-1088. Where should I keep my medicine? This drug is given in a hospital or clinic and will not be stored at home. NOTE: This sheet is a summary. It may not cover all possible information. If you have questions about this medicine, talk to your doctor, pharmacist, or health care provider.    2016, [employer]. (2014-06-18 14:07:06)  

## 2015-12-27 NOTE — Progress Notes (Signed)
  Subjective:     Danielle Morgan is a 20 y.o. female who presents for a postpartum visit. She is 6 weeks postpartum following a spontaneous vaginal delivery. I have fully reviewed the prenatal and intrapartum course. The delivery was at 38 gestational weeks. Outcome: spontaneous vaginal delivery. Anesthesia: epidural. Postpartum course has been normal. Baby's course has been normal. Baby is feeding by breast. Bleeding no bleeding. Bowel function is normal. Bladder function is normal. Patient is not sexually active. Contraception method is abstinence. Postpartum depression screening: negative.  The following portions of the patient's history were reviewed and updated as appropriate: allergies, current medications, past family history, past medical history, past social history, past surgical history and problem list.  Review of Systems Pertinent items noted in HPI and remainder of comprehensive ROS otherwise negative.   Objective:    General:  alert, cooperative and appears stated age  Lungs: normal effort  Heart:  regular rate and rhythm  Abdomen: soft, non-tender; bowel sounds normal; no masses,  no organomegaly   Vulva:  normal        Assessment:     Normal postpartum exam. Pap smear not done at today's visit.   Plan:    1. Contraception: Nexplanon 2. Pap due at age 20 3. Follow up in: 1 year or as needed.

## 2016-01-16 LAB — HM PAP SMEAR: HM PAP: NORMAL

## 2016-08-16 ENCOUNTER — Encounter: Payer: Self-pay | Admitting: Internal Medicine

## 2016-08-16 ENCOUNTER — Ambulatory Visit (INDEPENDENT_AMBULATORY_CARE_PROVIDER_SITE_OTHER): Payer: BLUE CROSS/BLUE SHIELD | Admitting: Internal Medicine

## 2016-08-16 VITALS — BP 120/78 | HR 76 | Temp 97.8°F | Wt 173.5 lb

## 2016-08-16 DIAGNOSIS — R21 Rash and other nonspecific skin eruption: Secondary | ICD-10-CM | POA: Diagnosis not present

## 2016-08-16 DIAGNOSIS — Z23 Encounter for immunization: Secondary | ICD-10-CM

## 2016-08-16 DIAGNOSIS — R519 Headache, unspecified: Secondary | ICD-10-CM

## 2016-08-16 DIAGNOSIS — R51 Headache: Secondary | ICD-10-CM

## 2016-08-16 MED ORDER — CYCLOBENZAPRINE HCL 10 MG PO TABS: ORAL_TABLET | ORAL | 1 refills | Status: DC

## 2016-08-16 MED ORDER — FLUCONAZOLE 150 MG PO TABS: 150.0000 mg | ORAL_TABLET | Freq: Once | ORAL | 0 refills | Status: AC

## 2016-08-16 NOTE — Patient Instructions (Signed)
Contact Dermatitis Introduction Dermatitis is redness, soreness, and swelling (inflammation) of the skin. Contact dermatitis is a reaction to certain substances that touch the skin. There are two types of contact dermatitis:  Irritant contact dermatitis. This type is caused by something that irritates your skin, such as dry hands from washing them too much. This type does not require previous exposure to the substance for a reaction to occur. This type is more common.  Allergic contact dermatitis. This type is caused by a substance that you are allergic to, such as a nickel allergy or poison ivy. This type only occurs if you have been exposed to the substance (allergen) before. Upon a repeat exposure, your body reacts to the substance. This type is less common. What are the causes? Many different substances can cause contact dermatitis. Irritant contact dermatitis is most commonly caused by exposure to:  Makeup.  Soaps.  Detergents.  Bleaches.  Acids.  Metal salts, such as nickel. Allergic contact dermatitis is most commonly caused by exposure to:  Poisonous plants.  Chemicals.  Jewelry.  Latex.  Medicines.  Preservatives in products, such as clothing. What increases the risk? This condition is more likely to develop in:  People who have jobs that expose them to irritants or allergens.  People who have certain medical conditions, such as asthma or eczema. What are the signs or symptoms? Symptoms of this condition may occur anywhere on your body where the irritant has touched you or is touched by you. Symptoms include:  Dryness or flaking.  Redness.  Cracks.  Itching.  Pain or a burning feeling.  Blisters.  Drainage of small amounts of blood or clear fluid from skin cracks. With allergic contact dermatitis, there may also be swelling in areas such as the eyelids, mouth, or genitals. How is this diagnosed? This condition is diagnosed with a medical history and  physical exam. A patch skin test may be performed to help determine the cause. If the condition is related to your job, you may need to see an occupational medicine specialist. How is this treated? Treatment for this condition includes figuring out what caused the reaction and protecting your skin from further contact. Treatment may also include:  Steroid creams or ointments. Oral steroid medicines may be needed in more severe cases.  Antibiotics or antibacterial ointments, if a skin infection is present.  Antihistamine lotion or an antihistamine taken by mouth to ease itching.  A bandage (dressing). Follow these instructions at home: Skin Care  Moisturize your skin as needed.  Apply cool compresses to the affected areas.  Try taking a bath with:  Epsom salts. Follow the instructions on the packaging. You can get these at your local pharmacy or grocery store.  Baking soda. Pour a small amount into the bath as directed by your health care provider.  Colloidal oatmeal. Follow the instructions on the packaging. You can get this at your local pharmacy or grocery store.  Try applying baking soda paste to your skin. Stir water into baking soda until it reaches a paste-like consistency.  Do not scratch your skin.  Bathe less frequently, such as every other day.  Bathe in lukewarm water. Avoid using hot water. Medicines  Take or apply over-the-counter and prescription medicines only as told by your health care provider.  If you were prescribed an antibiotic medicine, take or apply your antibiotic as told by your health care provider. Do not stop using the antibiotic even if your condition starts to improve. General instructions    Keep all follow-up visits as told by your health care provider. This is important.  Avoid the substance that caused your reaction. If you do not know what caused it, keep a journal to try to track what caused it. Write down:  What you eat.  What cosmetic  products you use.  What you drink.  What you wear in the affected area. This includes jewelry.  If you were given a dressing, take care of it as told by your health care provider. This includes when to change and remove it. Contact a health care provider if:  Your condition does not improve with treatment.  Your condition gets worse.  You have signs of infection such as swelling, tenderness, redness, soreness, or warmth in the affected area.  You have a fever.  You have new symptoms. Get help right away if:  You have a severe headache, neck pain, or neck stiffness.  You vomit.  You feel very sleepy.  You notice red streaks coming from the affected area.  Your bone or joint underneath the affected area becomes painful after the skin has healed.  The affected area turns darker.  You have difficulty breathing. This information is not intended to replace advice given to you by your health care provider. Make sure you discuss any questions you have with your health care provider. Document Released: 08/31/2000 Document Revised: 02/09/2016 Document Reviewed: 01/19/2015  2017 Elsevier  

## 2016-08-16 NOTE — Progress Notes (Signed)
Subjective:    Patient ID: Danielle Morgan, female    DOB: 1996-03-27, 20 y.o.   MRN: [id number]  HPI  Pt presents to the clinic today with c/o a rash to her bilateral armpits. This started 1-2 weeks ago. The rash is very itchy. She reports it has spread down the back of her arms. She has not noticed the rash anywhere else. She denies changes in deodorant, soap or lotions. She has not recently gotten new clothes and worn them without washing them first. She has not tried anything OTC for this.  Additionally, she is requesting a refill of Flexeril for her frequent headaches. They are hormonal/tension related. They are located in her forehead. She has associated nausea but no vomiting. She does have some sensitivity to light and sound with mild lightheadedness.   She does want a flu shot today.  Review of Systems      Past Medical History:  Diagnosis Date  . Frequent headaches     Current Outpatient Prescriptions  Medication Sig Dispense Refill  . cyclobenzaprine (FLEXERIL) 10 MG tablet TAKE 1 TABLET (10 MG TOTAL) BY MOUTH EVERY 8 (EIGHT) HOURS AS NEEDED FOR MUSCLE SPASMS. 30 tablet 1  . etonogestrel (NEXPLANON) 68 MG IMPL implant 1 each by Subdermal route once.    . fluconazole (DIFLUCAN) 150 MG tablet Take 1 tablet (150 mg total) by mouth once. May repeat in three days if needed 2 tablet 0   No current facility-administered medications for this visit.     No Known Allergies  Family History  Problem Relation Age of Onset  . Arthritis Mother   . Bipolar disorder Father   . Cancer Paternal Grandmother     Lung  . ADD / ADHD Sister   . ADD / ADHD Brother   . Heart disease Maternal Uncle   . Arthritis Maternal Grandfather   . Hypertension Maternal Grandfather   . Alcohol abuse Maternal Grandfather     Social History   Social History  . Marital status: Single    Spouse name: N/A  . Number of children: N/A  . Years of education: N/A   Occupational History  . Not on  file.   Social History Main Topics  . Smoking status: Never Smoker  . Smokeless tobacco: Never Used  . Alcohol use No  . Drug use: No  . Sexual activity: Not Currently    Birth control/ protection: None   Other Topics Concern  . Not on file   Social History Narrative  . No narrative on file     Constitutional: Pt reports headaches. Denies fever, malaise, fatigue, or abrupt weight changes.  Skin: Pt reports rash. Denies lesions or ulcercations.    No other specific complaints in a complete review of systems (except as listed in HPI above).  Objective:   Physical Exam  BP 120/78   Pulse 76   Temp 97.8 F (36.6 C) (Oral)   Wt 173 lb 8 oz (78.7 kg)   SpO2 98%   BMI 28.87 kg/m  Wt Readings from Last 3 Encounters:  08/16/16 173 lb 8 oz (78.7 kg)  11/16/15 198 lb (89.8 kg)  11/15/15 198 lb (89.8 kg)    General: Appears her stated age, well developed, well nourished in NAD. Skin: Maculopapular rash noted in bilateral armpits.  Neurological: Alert and oriented.   BMET    Component Value Date/Time   NA 134 (L) 11/16/2015 0545   K 3.9 11/16/2015 0545   CL 105  11/16/2015 0545   CO2 18 (L) 11/16/2015 0545   GLUCOSE 100 (H) 11/16/2015 0545   BUN 10 11/16/2015 0545   CREATININE 0.55 11/16/2015 0545   CALCIUM 9.2 11/16/2015 0545   GFRNONAA >60 11/16/2015 0545   GFRAA >60 11/16/2015 0545    Lipid Panel  No results found for: CHOL, TRIG, HDL, CHOLHDL, VLDL, LDLCALC  CBC    Component Value Date/Time   WBC 16.0 (H) 11/16/2015 0545   RBC 4.18 11/16/2015 0545   HGB 10.0 (L) 11/16/2015 0545   HCT 31.6 (L) 11/16/2015 0545   PLT 309 11/16/2015 0545   MCV 75.6 (L) 11/16/2015 0545   MCH 23.9 (L) 11/16/2015 0545   MCHC 31.6 11/16/2015 0545   RDW 16.0 (H) 11/16/2015 0545   LYMPHSABS 1.2 04/26/2015 1053   MONOABS 0.9 04/26/2015 1053   EOSABS 0.1 04/26/2015 1053   BASOSABS 0.0 04/26/2015 1053    Hgb A1C No results found for: HGBA1C          Assessment &  Plan:   Rash:  Appears to be a contact dermatitis Advised her to avoid shaving/deoderant until rash resolves 80 mg Depo IM  Advised her to take either Benadryl or Zyrtec daily x 5 days She really feels like this may be yeast although it doesn't look like it- eRx for Diflucan 150 mg PO x 1.  Frequent Headaches:  Flexeril refilled today  Flu shot given today  RTC in 1 year for your annual exam Danielle Morgan, Danielle Fulp, NP

## 2016-08-23 NOTE — Addendum Note (Signed)
Addended by: Roena MaladyEVONTENNO, MELANIE Y on: 08/23/2016 04:58 PM   Modules accepted: Orders

## 2016-11-08 ENCOUNTER — Telehealth: Payer: Self-pay

## 2016-11-08 NOTE — Telephone Encounter (Signed)
Pt left v/m requesting appt for TB skin test for work; left v/m requesting cb.

## 2016-12-11 ENCOUNTER — Encounter: Payer: Self-pay | Admitting: Internal Medicine

## 2016-12-11 ENCOUNTER — Ambulatory Visit (INDEPENDENT_AMBULATORY_CARE_PROVIDER_SITE_OTHER): Payer: BLUE CROSS/BLUE SHIELD | Admitting: Internal Medicine

## 2016-12-11 ENCOUNTER — Encounter (INDEPENDENT_AMBULATORY_CARE_PROVIDER_SITE_OTHER): Payer: Self-pay

## 2016-12-11 VITALS — BP 118/80 | HR 77 | Temp 98.1°F | Ht 65.0 in | Wt 170.0 lb

## 2016-12-11 DIAGNOSIS — Z0289 Encounter for other administrative examinations: Secondary | ICD-10-CM | POA: Diagnosis not present

## 2016-12-11 DIAGNOSIS — Z111 Encounter for screening for respiratory tuberculosis: Secondary | ICD-10-CM | POA: Diagnosis not present

## 2016-12-11 DIAGNOSIS — Z0000 Encounter for general adult medical examination without abnormal findings: Secondary | ICD-10-CM | POA: Diagnosis not present

## 2016-12-11 LAB — COMPREHENSIVE METABOLIC PANEL
ALBUMIN: 4.4 g/dL (ref 3.5–5.2)
ALK PHOS: 76 U/L (ref 39–117)
ALT: 11 U/L (ref 0–35)
AST: 17 U/L (ref 0–37)
BILIRUBIN TOTAL: 0.4 mg/dL (ref 0.2–1.2)
BUN: 9 mg/dL (ref 6–23)
CALCIUM: 9.5 mg/dL (ref 8.4–10.5)
CHLORIDE: 104 meq/L (ref 96–112)
CO2: 25 mEq/L (ref 19–32)
Creatinine, Ser: 0.56 mg/dL (ref 0.40–1.20)
GFR: 145.09 mL/min (ref >60.00)
Glucose, Bld: 87 mg/dL (ref 70–99)
Potassium: 3.9 mEq/L (ref 3.5–5.1)
Sodium: 136 mEq/L (ref 135–145)
TOTAL PROTEIN: 7.9 g/dL (ref 6.0–8.3)

## 2016-12-11 LAB — CBC
HCT: 38.6 % (ref 36.0–46.0)
Hemoglobin: 12.7 g/dL (ref 12.0–15.0)
MCHC: 32.9 g/dL (ref 30.0–36.0)
MCV: 83.9 fl (ref 78.0–100.0)
PLATELETS: 288 10*3/uL (ref 150.0–400.0)
RBC: 4.59 Mil/uL (ref 3.87–5.11)
RDW: 14.2 % (ref 11.5–15.5)
WBC: 6.4 10*3/uL (ref 4.0–10.5)

## 2016-12-11 LAB — LIPID PANEL
Cholesterol: 170 mg/dL (ref 0–200)
HDL: 47.6 mg/dL (ref >39.00)
LDL Cholesterol: 102 mg/dL — ABNORMAL HIGH (ref 0–99)
NonHDL: 122.44
TRIGLYCERIDES: 102 mg/dL (ref 0.0–149.0)
Total CHOL/HDL Ratio: 4
VLDL: 20.4 mg/dL (ref 0.0–40.0)

## 2016-12-11 NOTE — Patient Instructions (Signed)
Health Maintenance, Female Adopting a healthy lifestyle and getting preventive care can go a long way to promote health and wellness. Talk with your health care provider about what schedule of regular examinations is right for you. This is a good chance for you to check in with your provider about disease prevention and staying healthy. In between checkups, there are plenty of things you can do on your own. Experts have done a lot of research about which lifestyle changes and preventive measures are most likely to keep you healthy. Ask your health care provider for more information. Weight and diet Eat a healthy diet  Be sure to include plenty of vegetables, fruits, low-fat dairy products, and lean protein.  Do not eat a lot of foods high in solid fats, added sugars, or salt.  Get regular exercise. This is one of the most important things you can do for your health.  Most adults should exercise for at least 150 minutes each week. The exercise should increase your heart rate and make you sweat (moderate-intensity exercise).  Most adults should also do strengthening exercises at least twice a week. This is in addition to the moderate-intensity exercise. Maintain a healthy weight  Body mass index (BMI) is a measurement that can be used to identify possible weight problems. It estimates body fat based on height and weight. Your health care provider can help determine your BMI and help you achieve or maintain a healthy weight.  For females 76 years of age and older:  A BMI below 18.5 is considered underweight.  A BMI of 18.5 to 24.9 is normal.  A BMI of 25 to 29.9 is considered overweight.  A BMI of 30 and above is considered obese. Watch levels of cholesterol and blood lipids  You should start having your blood tested for lipids and cholesterol at 21 years of age, then have this test every 5 years.  You may need to have your cholesterol levels checked more often if:  Your lipid or  cholesterol levels are high.  You are older than 21 years of age.  You are at high risk for heart disease. Cancer screening Lung Cancer  Lung cancer screening is recommended for adults 64-42 years old who are at high risk for lung cancer because of a history of smoking.  A yearly low-dose CT scan of the lungs is recommended for people who:  Currently smoke.  Have quit within the past 15 years.  Have at least a 30-pack-year history of smoking. A pack year is smoking an average of one pack of cigarettes a day for 1 year.  Yearly screening should continue until it has been 15 years since you quit.  Yearly screening should stop if you develop a health problem that would prevent you from having lung cancer treatment. Breast Cancer  Practice breast self-awareness. This means understanding how your breasts normally appear and feel.  It also means doing regular breast self-exams. Let your health care provider know about any changes, no matter how small.  If you are in your 20s or 30s, you should have a clinical breast exam (CBE) by a health care provider every 1-3 years as part of a regular health exam.  If you are 34 or older, have a CBE every year. Also consider having a breast X-ray (mammogram) every year.  If you have a family history of breast cancer, talk to your health care provider about genetic screening.  If you are at high risk for breast cancer, talk  to your health care provider about having an MRI and a mammogram every year.  Breast cancer gene (BRCA) assessment is recommended for women who have family members with BRCA-related cancers. BRCA-related cancers include:  Breast.  Ovarian.  Tubal.  Peritoneal cancers.  Results of the assessment will determine the need for genetic counseling and BRCA1 and BRCA2 testing. Cervical Cancer  Your health care provider may recommend that you be screened regularly for cancer of the pelvic organs (ovaries, uterus, and vagina).  This screening involves a pelvic examination, including checking for microscopic changes to the surface of your cervix (Pap test). You may be encouraged to have this screening done every 3 years, beginning at age 24.  For women ages 66-65, health care providers may recommend pelvic exams and Pap testing every 3 years, or they may recommend the Pap and pelvic exam, combined with testing for human papilloma virus (HPV), every 5 years. Some types of HPV increase your risk of cervical cancer. Testing for HPV may also be done on women of any age with unclear Pap test results.  Other health care providers may not recommend any screening for nonpregnant women who are considered low risk for pelvic cancer and who do not have symptoms. Ask your health care provider if a screening pelvic exam is right for you.  If you have had past treatment for cervical cancer or a condition that could lead to cancer, you need Pap tests and screening for cancer for at least 20 years after your treatment. If Pap tests have been discontinued, your risk factors (such as having a new sexual partner) need to be reassessed to determine if screening should resume. Some women have medical problems that increase the chance of getting cervical cancer. In these cases, your health care provider may recommend more frequent screening and Pap tests. Colorectal Cancer  This type of cancer can be detected and often prevented.  Routine colorectal cancer screening usually begins at 21 years of age and continues through 21 years of age.  Your health care provider may recommend screening at an earlier age if you have risk factors for colon cancer.  Your health care provider may also recommend using home test kits to check for hidden blood in the stool.  A small camera at the end of a tube can be used to examine your colon directly (sigmoidoscopy or colonoscopy). This is done to check for the earliest forms of colorectal cancer.  Routine  screening usually begins at age 41.  Direct examination of the colon should be repeated every 5-10 years through 21 years of age. However, you may need to be screened more often if early forms of precancerous polyps or small growths are found. Skin Cancer  Check your skin from head to toe regularly.  Tell your health care provider about any new moles or changes in moles, especially if there is a change in a mole's shape or color.  Also tell your health care provider if you have a mole that is larger than the size of a pencil eraser.  Always use sunscreen. Apply sunscreen liberally and repeatedly throughout the day.  Protect yourself by wearing long sleeves, pants, a wide-brimmed hat, and sunglasses whenever you are outside. Heart disease, diabetes, and high blood pressure  High blood pressure causes heart disease and increases the risk of stroke. High blood pressure is more likely to develop in:  People who have blood pressure in the high end of the normal range (130-139/85-89 mm Hg).  People who are overweight or obese.  People who are African American.  If you are 59-24 years of age, have your blood pressure checked every 3-5 years. If you are 34 years of age or older, have your blood pressure checked every year. You should have your blood pressure measured twice-once when you are at a hospital or clinic, and once when you are not at a hospital or clinic. Record the average of the two measurements. To check your blood pressure when you are not at a hospital or clinic, you can use:  An automated blood pressure machine at a pharmacy.  A home blood pressure monitor.  If you are between 29 years and 60 years old, ask your health care provider if you should take aspirin to prevent strokes.  Have regular diabetes screenings. This involves taking a blood sample to check your fasting blood sugar level.  If you are at a normal weight and have a low risk for diabetes, have this test once  every three years after 21 years of age.  If you are overweight and have a high risk for diabetes, consider being tested at a younger age or more often. Preventing infection Hepatitis B  If you have a higher risk for hepatitis B, you should be screened for this virus. You are considered at high risk for hepatitis B if:  You were born in a country where hepatitis B is common. Ask your health care provider which countries are considered high risk.  Your parents were born in a high-risk country, and you have not been immunized against hepatitis B (hepatitis B vaccine).  You have HIV or AIDS.  You use needles to inject street drugs.  You live with someone who has hepatitis B.  You have had sex with someone who has hepatitis B.  You get hemodialysis treatment.  You take certain medicines for conditions, including cancer, organ transplantation, and autoimmune conditions. Hepatitis C  Blood testing is recommended for:  Everyone born from [birth year] through 1965.  Anyone with known risk factors for hepatitis C. Sexually transmitted infections (STIs)  You should be screened for sexually transmitted infections (STIs) including gonorrhea and chlamydia if:  You are sexually active and are younger than 21 years of age.  You are older than 21 years of age and your health care provider tells you that you are at risk for this type of infection.  Your sexual activity has changed since you were last screened and you are at an increased risk for chlamydia or gonorrhea. Ask your health care provider if you are at risk.  If you do not have HIV, but are at risk, it may be recommended that you take a prescription medicine daily to prevent HIV infection. This is called pre-exposure prophylaxis (PrEP). You are considered at risk if:  You are sexually active and do not regularly use condoms or know the HIV status of your partner(s).  You take drugs by injection.  You are sexually active with a partner  who has HIV. Talk with your health care provider about whether you are at high risk of being infected with HIV. If you choose to begin PrEP, you should first be tested for HIV. You should then be tested every 3 months for as long as you are taking PrEP. Pregnancy  If you are premenopausal and you may become pregnant, ask your health care provider about preconception counseling.  If you may become pregnant, take 400 to 800 micrograms (mcg) of folic acid  every day.  If you want to prevent pregnancy, talk to your health care provider about birth control (contraception). Osteoporosis and menopause  Osteoporosis is a disease in which the bones lose minerals and strength with aging. This can result in serious bone fractures. Your risk for osteoporosis can be identified using a bone density scan.  If you are 4 years of age or older, or if you are at risk for osteoporosis and fractures, ask your health care provider if you should be screened.  Ask your health care provider whether you should take a calcium or vitamin D supplement to lower your risk for osteoporosis.  Menopause may have certain physical symptoms and risks.  Hormone replacement therapy may reduce some of these symptoms and risks. Talk to your health care provider about whether hormone replacement therapy is right for you. Follow these instructions at home:  Schedule regular health, dental, and eye exams.  Stay current with your immunizations.  Do not use any tobacco products including cigarettes, chewing tobacco, or electronic cigarettes.  If you are pregnant, do not drink alcohol.  If you are breastfeeding, limit how much and how often you drink alcohol.  Limit alcohol intake to no more than 1 drink per day for nonpregnant women. One drink equals 12 ounces of beer, 5 ounces of wine, or 1 ounces of hard liquor.  Do not use street drugs.  Do not share needles.  Ask your health care provider for help if you need support  or information about quitting drugs.  Tell your health care provider if you often feel depressed.  Tell your health care provider if you have ever been abused or do not feel safe at home. This information is not intended to replace advice given to you by your health care provider. Make sure you discuss any questions you have with your health care provider. Document Released: 03/19/2011 Document Revised: 02/09/2016 Document Reviewed: 06/07/2015 Elsevier Interactive Patient Education  2017 [employer].

## 2016-12-11 NOTE — Progress Notes (Signed)
Subjective:    Patient ID: Danielle Morgan, female    DOB: 1996-05-19, 21 y.o.   MRN: [id number]  HPI  Pt presents to the clinic today for her annual exam. She also has a form she needs filled out for her employer.  Flu: 07/2016 Tetanus: 08/2015 Pap Smear: 01/2016 Dentist: biannually  Diet: She does eat meat. She consumes fruits and veggies daily. She does eat some fried foods. She drinks water, some soda. Exercise: None  Review of Systems      Past Medical History:  Diagnosis Date  . Frequent headaches     Current Outpatient Prescriptions  Medication Sig Dispense Refill  . etonogestrel (NEXPLANON) 68 MG IMPL implant 1 each by Subdermal route once.     No current facility-administered medications for this visit.     No Known Allergies  Family History  Problem Relation Age of Onset  . Arthritis Mother   . Bipolar disorder Father   . Cancer Paternal Grandmother     Lung  . ADD / ADHD Sister   . ADD / ADHD Brother   . Heart disease Maternal Uncle   . Arthritis Maternal Grandfather   . Hypertension Maternal Grandfather   . Alcohol abuse Maternal Grandfather     Social History   Social History  . Marital status: Single    Spouse name: N/A  . Number of children: N/A  . Years of education: N/A   Occupational History  . Not on file.   Social History Main Topics  . Smoking status: Never Smoker  . Smokeless tobacco: Never Used  . Alcohol use No  . Drug use: No  . Sexual activity: Not Currently    Birth control/ protection: None   Other Topics Concern  . Not on file   Social History Narrative  . No narrative on file     Constitutional: Denies fever, malaise, fatigue, headache or abrupt weight changes.  HEENT: Denies eye pain, eye redness, ear pain, ringing in the ears, wax buildup, runny nose, nasal congestion, bloody nose, or sore throat. Respiratory: Denies difficulty breathing, shortness of breath, cough or sputum production.   Cardiovascular:  Denies chest pain, chest tightness, palpitations or swelling in the hands or feet.  Gastrointestinal: Denies abdominal pain, bloating, constipation, diarrhea or blood in the stool.  GU: Denies urgency, frequency, pain with urination, burning sensation, blood in urine, odor or discharge. Musculoskeletal: Denies decrease in range of motion, difficulty with gait, muscle pain or joint pain and swelling.  Skin: Denies redness, rashes, lesions or ulcercations.  Neurological: Denies dizziness, difficulty with memory, difficulty with speech or problems with balance and coordination.  Psych: Denies anxiety, depression, SI/HI.  No other specific complaints in a complete review of systems (except as listed in HPI above).  Objective:   Physical Exam   BP 118/80   Pulse 77   Temp 98.1 F (36.7 C) (Oral)   Ht 5\' 5"  (1.651 m)   Wt 170 lb (77.1 kg)   SpO2 98%   BMI 28.29 kg/m  Wt Readings from Last 3 Encounters:  12/11/16 170 lb (77.1 kg)  08/16/16 173 lb 8 oz (78.7 kg)  11/16/15 198 lb (89.8 kg)    General: Appears her stated age, well developed, well nourished in NAD. Skin: Warm, dry and intact.  HEENT: Head: normal shape and size; Eyes: sclera white, no icterus, conjunctiva pink, PERRLA and EOMs intact; Ears: Tm's gray and intact, normal light reflex; Throat/Mouth: Teeth present, mucosa pink and moist,  no exudate, lesions or ulcerations noted.  Neck:  Neck supple, trachea midline. No masses, lumps or thyromegaly present.  Cardiovascular: Normal rate and rhythm. S1,S2 noted.  No murmur, rubs or gallops noted. No JVD or BLE edema.  Pulmonary/Chest: Normal effort and positive vesicular breath sounds. No respiratory distress. No wheezes, rales or ronchi noted.  Abdomen: Soft and nontender. Normal bowel sounds. No distention or masses noted. Liver, spleen and kidneys non palpable. Musculoskeletal: Strength 5/5 BLE. No difficulty with gait.  Neurological: Alert and oriented. Cranial nerves II-XII  grossly intact. Coordination normal.  Psychiatric: Mood and affect normal. Behavior is normal. Judgment and thought content normal.     BMET    Component Value Date/Time   NA 134 (L) 11/16/2015 0545   K 3.9 11/16/2015 0545   CL 105 11/16/2015 0545   CO2 18 (L) 11/16/2015 0545   GLUCOSE 100 (H) 11/16/2015 0545   BUN 10 11/16/2015 0545   CREATININE 0.55 11/16/2015 0545   CALCIUM 9.2 11/16/2015 0545   GFRNONAA >60 11/16/2015 0545   GFRAA >60 11/16/2015 0545    Lipid Panel  No results found for: CHOL, TRIG, HDL, CHOLHDL, VLDL, LDLCALC  CBC    Component Value Date/Time   WBC 16.0 (H) 11/16/2015 0545   RBC 4.18 11/16/2015 0545   HGB 10.0 (L) 11/16/2015 0545   HCT 31.6 (L) 11/16/2015 0545   PLT 309 11/16/2015 0545   MCV 75.6 (L) 11/16/2015 0545   MCH 23.9 (L) 11/16/2015 0545   MCHC 31.6 11/16/2015 0545   RDW 16.0 (H) 11/16/2015 0545   LYMPHSABS 1.2 04/26/2015 1053   MONOABS 0.9 04/26/2015 1053   EOSABS 0.1 04/26/2015 1053   BASOSABS 0.0 04/26/2015 1053    Hgb A1C No results found for: HGBA1C    Assessment & Plan:   Preventative Health Maintenance:  Flu and tetanus UTD Pap smear UTD Encouraged her to consume a balanced diet and exercise regimen Advised her to see a dentist annually Will check CBC, CMET, Lipid profile  Encounter for Form Completion:  Quantiferon Gold TB assay today Form filled out, awaiting lab results  RTC in 1 year, sooner if needed Nicki Reaper, NP

## 2016-12-13 LAB — QUANTIFERON TB GOLD ASSAY (BLOOD)
Interferon Gamma Release Assay: NEGATIVE
Mitogen-Nil: >10 IU/mL
Quantiferon Nil Value: 0.02 IU/mL
Quantiferon Tb Ag Minus Nil Value: 0 IU/mL

## 2016-12-28 NOTE — Telephone Encounter (Signed)
I spoke with pt and she has already gotten TB skin test nothing further needed.

## 2017-03-19 ENCOUNTER — Encounter: Payer: Self-pay | Admitting: Family Medicine

## 2017-03-19 ENCOUNTER — Ambulatory Visit (INDEPENDENT_AMBULATORY_CARE_PROVIDER_SITE_OTHER): Payer: BLUE CROSS/BLUE SHIELD | Admitting: Family Medicine

## 2017-03-19 DIAGNOSIS — N761 Subacute and chronic vaginitis: Secondary | ICD-10-CM | POA: Diagnosis not present

## 2017-03-19 NOTE — Progress Notes (Signed)
Sx started about last week.  She was using a new soap.  Had external burning there after.  External irritation.  Used monistat 3 day with dec in discharge.  Still with irritation with wiping but better than prev.  No FCNAVD.  No burning with urination.  No ulceration.  D/w pt about changing back to old soap, she already changed back.  Symmetric B sx per patient report.    Meds, vitals, and allergies reviewed.   ROS: Per HPI unless specifically indicated in ROS section   nad ncat rrr abd soft Ext w/o edema Genital exam deferred.  D/w pt.

## 2017-03-19 NOTE — Patient Instructions (Signed)
If you have more discharge then restart monistat.  In the meantime, you can get OTC ketoconazole cream and use that for a presumed external yeast infection.   If you continue to have itching and irritation, you can use a small amount of 1% hydrocortisone cream.  Use the least amount possible.  You can use ketoconazole with hydrocortisone.  Update us as needed.  Take care.  Glad to see you.

## 2017-03-21 DIAGNOSIS — N760 Acute vaginitis: Secondary | ICD-10-CM | POA: Insufficient documentation

## 2017-03-21 NOTE — Assessment & Plan Note (Signed)
If more discharge then restart monistat.  In the meantime, can get OTC ketoconazole cream and use that for a presumed external yeast infection.   If continuing to have itching and irritation, can use a small amount of 1% hydrocortisone cream.  Use the least amount possible.  Can use ketoconazole with hydrocortisone.  Update us as needed.  She agrees.

## 2017-04-25 ENCOUNTER — Ambulatory Visit: Payer: Self-pay | Admitting: *Deleted

## 2017-04-25 VITALS — BP 119/78 | HR 65 | Ht 65.5 in | Wt 168.0 lb

## 2017-04-25 DIAGNOSIS — Z0000 Encounter for general adult medical examination without abnormal findings: Secondary | ICD-10-CM

## 2017-04-25 NOTE — Progress Notes (Signed)
[employer] insurance premium discount evaluation: Labs Drawn. Replacements ROI form signed. Tobacco Free Attestation form signed.  Forms placed in paper chart.  Okay to route results to PCP per pt. 

## 2017-04-26 LAB — CMP12+LP+TP+TSH+6AC+CBC/D/PLT
ALBUMIN: 4.5 g/dL (ref 3.5–5.5)
ALT: 14 IU/L (ref 0–32)
AST: 18 IU/L (ref 0–40)
Albumin/Globulin Ratio: 1.6 (ref 1.2–2.2)
Alkaline Phosphatase: 73 IU/L (ref 39–117)
BASOS ABS: 0 10*3/uL (ref 0.0–0.2)
BASOS: 0 %
BUN / CREAT RATIO: 13 (ref 9–23)
BUN: 8 mg/dL (ref 6–20)
Bilirubin Total: 0.3 mg/dL (ref 0.0–1.2)
CALCIUM: 9.2 mg/dL (ref 8.7–10.2)
Chloride: 102 mmol/L (ref 96–106)
Chol/HDL Ratio: 2.9 ratio (ref 0.0–4.4)
Cholesterol, Total: 162 mg/dL (ref 100–199)
Creatinine, Ser: 0.61 mg/dL (ref 0.57–1.00)
EOS (ABSOLUTE): 0 10*3/uL (ref 0.0–0.4)
EOS: 1 %
FREE THYROXINE INDEX: 1.6 (ref 1.2–4.9)
GFR calc Af Amer: 150 mL/min/{1.73_m2} (ref >59)
GFR calc non Af Amer: 130 mL/min/{1.73_m2} (ref >59)
GGT: 13 IU/L (ref 0–60)
Globulin, Total: 2.8 g/dL (ref 1.5–4.5)
Glucose: 76 mg/dL (ref 65–99)
HDL: 55 mg/dL (ref >39)
HEMATOCRIT: 37.7 % (ref 34.0–46.6)
HEMOGLOBIN: 11.7 g/dL (ref 11.1–15.9)
IMMATURE GRANS (ABS): 0 10*3/uL (ref 0.0–0.1)
IMMATURE GRANULOCYTES: 0 %
IRON: 44 ug/dL (ref 27–159)
LDH: 176 IU/L (ref 119–226)
LDL CALC: 93 mg/dL (ref 0–99)
LYMPHS ABS: 1.7 10*3/uL (ref 0.7–3.1)
LYMPHS: 28 %
MCH: 26.9 pg (ref 26.6–33.0)
MCHC: 31 g/dL — ABNORMAL LOW (ref 31.5–35.7)
MCV: 87 fL (ref 79–97)
MONOCYTES: 10 %
Monocytes Absolute: 0.6 10*3/uL (ref 0.1–0.9)
NEUTROS PCT: 61 %
Neutrophils Absolute: 3.8 10*3/uL (ref 1.4–7.0)
Phosphorus: 3.5 mg/dL (ref 2.5–4.5)
Platelets: 259 10*3/uL (ref 150–379)
Potassium: 4.4 mmol/L (ref 3.5–5.2)
RBC: 4.35 x10E6/uL (ref 3.77–5.28)
RDW: 14.7 % (ref 12.3–15.4)
SODIUM: 136 mmol/L (ref 134–144)
T3 Uptake Ratio: 21 % — ABNORMAL LOW (ref 24–39)
T4, Total: 7.6 ug/dL (ref 4.5–12.0)
TOTAL PROTEIN: 7.3 g/dL (ref 6.0–8.5)
TSH: 2.34 u[IU]/mL (ref 0.450–4.500)
Triglycerides: 70 mg/dL (ref 0–149)
Uric Acid: 3.7 mg/dL (ref 2.5–7.1)
VLDL CHOLESTEROL CAL: 14 mg/dL (ref 5–40)
WBC: 6.2 10*3/uL (ref 3.4–10.8)

## 2017-04-26 LAB — HGB A1C W/O EAG: Hgb A1c MFr Bld: 5.1 % (ref 4.8–5.6)

## 2017-05-02 NOTE — Progress Notes (Signed)
Results reviewed with pt. LDL returned to normal from previous. T3 slightly decreased. MCHC slightly low. Others labs WNL. Diet and exercise recommendations discussed with goal of BMI 25. Results routed to pcp per pt request. Copy provided to pt. No further questions/concerns.

## 2017-07-10 ENCOUNTER — Encounter: Payer: Self-pay | Admitting: Internal Medicine

## 2017-07-10 ENCOUNTER — Ambulatory Visit (INDEPENDENT_AMBULATORY_CARE_PROVIDER_SITE_OTHER): Payer: BLUE CROSS/BLUE SHIELD | Admitting: Internal Medicine

## 2017-07-10 VITALS — BP 118/76 | HR 70 | Temp 97.9°F | Wt 170.0 lb

## 2017-07-10 DIAGNOSIS — B373 Candidiasis of vulva and vagina: Secondary | ICD-10-CM

## 2017-07-10 DIAGNOSIS — R300 Dysuria: Secondary | ICD-10-CM | POA: Diagnosis not present

## 2017-07-10 DIAGNOSIS — B3731 Acute candidiasis of vulva and vagina: Secondary | ICD-10-CM

## 2017-07-10 DIAGNOSIS — R3915 Urgency of urination: Secondary | ICD-10-CM

## 2017-07-10 LAB — POC URINALSYSI DIPSTICK (AUTOMATED)
Bilirubin, UA: NEGATIVE
Glucose, UA: NEGATIVE
Nitrite, UA: NEGATIVE
Spec Grav, UA: 1.025 (ref 1.010–1.025)
UROBILINOGEN UA: 0.2 U/dL
pH, UA: 6 (ref 5.0–8.0)

## 2017-07-10 MED ORDER — FLUCONAZOLE 150 MG PO TABS: 150.0000 mg | ORAL_TABLET | Freq: Once | ORAL | 0 refills | Status: AC

## 2017-07-10 MED ORDER — CIPROFLOXACIN HCL 500 MG PO TABS: 500.0000 mg | ORAL_TABLET | Freq: Two times a day (BID) | ORAL | 0 refills | Status: DC

## 2017-07-10 MED ORDER — CYCLOBENZAPRINE HCL 10 MG PO TABS: 10.0000 mg | ORAL_TABLET | Freq: Three times a day (TID) | ORAL | 0 refills | Status: DC | PRN

## 2017-07-10 NOTE — Progress Notes (Signed)
HPI  Pt presents to the clinic today with c/o urinary urgency and dysuria. This started last night. She denies fever, chills, nausea or low back pain. She denies vaginal discharge or itching, but has had some burning. She is sexually active with 1 partner, they do not use condoms. She has not tried anything OTC for her symptoms.   Review of Systems  Past Medical History:  Diagnosis Date  . Frequent headaches     Family History  Problem Relation Age of Onset  . Arthritis Mother   . Bipolar disorder Father   . Cancer Paternal Grandmother        Lung  . ADD / ADHD Sister   . ADD / ADHD Brother   . Heart disease Maternal Uncle   . Arthritis Maternal Grandfather   . Hypertension Maternal Grandfather   . Alcohol abuse Maternal Grandfather     Social History   Social History  . Marital status: Single    Spouse name: N/A  . Number of children: N/A  . Years of education: N/A   Occupational History  . Not on file.   Social History Main Topics  . Smoking status: Never Smoker  . Smokeless tobacco: Never Used  . Alcohol use No  . Drug use: No  . Sexual activity: Not Currently    Birth control/ protection: None   Other Topics Concern  . Not on file   Social History Narrative  . No narrative on file    No Known Allergies   Constitutional: Denies fever, malaise, fatigue, headache or abrupt weight changes.   GU: Pt reports urgency, and pain with urination. Denies burning sensation, blood in urine, odor or discharge. Skin: Denies redness, rashes, lesions or ulcercations.   No other specific complaints in a complete review of systems (except as listed in HPI above).    Objective:   Physical Exam  BP 118/76   Pulse 70   Temp 97.9 F (36.6 C) (Oral)   Wt 170 lb (77.1 kg)   LMP 06/07/2017 Comment: irregular  SpO2 99%   BMI 27.86 kg/m   Wt Readings from Last 3 Encounters:  07/10/17 170 lb (77.1 kg)  04/25/17 168 lb (76.2 kg)  03/19/17 164 lb 12 oz (74.7 kg)     General: Appears her stated age, well developed, well nourished in NAD. Cardiovascular: Normal rate and rhythm. S1,S2 noted.   Pulmonary/Chest: Normal effort and positive vesicular breath sounds. No respiratory distress. No wheezes, rales or ronchi noted.  Abdomen: Soft. Normal bowel sounds. No distention or masses noted.  Tender to palpation over the bladder area. No CVA tenderness.       Assessment & Plan:   Urgency, Dysuria secondary to Possible UTI  Urinalysis: 1+ leuks, 3+ blood Will send urine culture eRx sent if for Cipro 500 mg BID x 3 days OK to take AZO OTC Drink plenty of fluids  Candida Vaginitis:  Wet Prep: + yeast eRx for Diflucan 150 mg PO x 1, repeat in 3 days  RTC as needed or if symptoms persist. Nicki ReaperBAITY, Jasai Sorg, NP

## 2017-07-10 NOTE — Addendum Note (Signed)
Addended by: Roena MaladyEVONTENNO, Earlin Sweeden Y on: 07/10/2017 05:23 PM   Modules accepted: Orders

## 2017-07-10 NOTE — Patient Instructions (Signed)

## 2017-07-12 ENCOUNTER — Ambulatory Visit (INDEPENDENT_AMBULATORY_CARE_PROVIDER_SITE_OTHER): Payer: BLUE CROSS/BLUE SHIELD | Admitting: Family Medicine

## 2017-07-12 ENCOUNTER — Telehealth: Payer: Self-pay

## 2017-07-12 ENCOUNTER — Encounter: Payer: Self-pay | Admitting: Family Medicine

## 2017-07-12 VITALS — BP 118/76 | HR 89 | Temp 97.8°F | Wt 168.5 lb

## 2017-07-12 DIAGNOSIS — N898 Other specified noninflammatory disorders of vagina: Secondary | ICD-10-CM

## 2017-07-12 DIAGNOSIS — N3090 Cystitis, unspecified without hematuria: Secondary | ICD-10-CM | POA: Diagnosis not present

## 2017-07-12 MED ORDER — SULFAMETHOXAZOLE-TRIMETHOPRIM 800-160 MG PO TABS: 1.0000 | ORAL_TABLET | Freq: Two times a day (BID) | ORAL | 0 refills | Status: DC

## 2017-07-12 NOTE — Telephone Encounter (Signed)
Left detailed msg on VM per HIPAA Depending on if pt wants to test for everything or something specific will determine whether or not it is urine and/or blood work

## 2017-07-12 NOTE — Progress Notes (Signed)
Subjective:    Patient ID: Danielle Morgan, female    DOB: December 29, 1995, 21 y.o.   MRN: [id number]  HPI Patient is 21 yo female who presents today with continued symptoms  Was seen two days ago with urinary urgency, dysuria and was diagnosed with cystitis and candida vaginitis. She was given cipro 500 mg po BID x 3 days and diflucan x 2. Continues to have urinary frequency and vaginal itching. Burning has improved, discharge improved, no hematuria. No fever/chills, no nausea or vomiting. Going out of town for long weekend to [place] friend[place] in [place] and didn't want to get worse while gone.    Past Medical History:  Diagnosis Date  . Frequent headaches    Past Surgical History:  Procedure Laterality Date  . NO PAST SURGERIES     Family History  Problem Relation Age of Onset  . Arthritis Mother   . Bipolar disorder Father   . Cancer Paternal Grandmother        Lung  . ADD / ADHD Sister   . ADD / ADHD Brother   . Heart disease Maternal Uncle   . Arthritis Maternal Grandfather   . Hypertension Maternal Grandfather   . Alcohol abuse Maternal Grandfather    Social History  Substance Use Topics  . Smoking status: Never Smoker  . Smokeless tobacco: Never Used  . Alcohol use No      Review of Systems Per HPI    Objective:   Physical Exam Physical Exam  Vitals reviewed. Constitutional: Oriented to person, place, and time. Appears well-developed and well-nourished.  HENT:  Head: Normocephalic and atraumatic.  Eyes: Conjunctivae are normal.  Neck: Normal range of motion. Neck supple.  Cardiovascular: Normal rate.   Pulmonary/Chest: Effort normal.  Musculoskeletal: Normal range of motion.  Neurological: Alert and oriented to person, place, and time.  Skin: Skin is warm and dry.  Psychiatric: Normal mood and affect. Behavior is normal. Judgment and thought content normal.      BP 118/76 (BP Location: Right Arm, Patient Position: Sitting, Cuff Size: Normal)    Pulse 89   Temp 97.8 F (36.6 C) (Oral)   Wt 168 lb 8 oz (76.4 kg)   SpO2 98%   BMI 27.61 kg/m  Wt Readings from Last 3 Encounters:  07/12/17 168 lb 8 oz (76.4 kg)  07/10/17 170 lb (77.1 kg)  04/25/17 168 lb (76.2 kg)   Results for orders placed or performed in visit on 07/10/17  Urine Culture  Result Value Ref Range   MICRO NUMBER: [id number]    SPECIMEN QUALITY: ADEQUATE    Sample Source NOT GIVEN    STATUS: PRELIMINARY    ISOLATE 1: Coagulase negative Staphylococcus species (A)   POCT Urinalysis Dipstick (Automated)  Result Value Ref Range   Color, UA     Clarity, UA     Glucose, UA neg    Bilirubin, UA neg    Ketones, UA ++    Spec Grav, UA 1.025 1.010 - 1.025   Blood, UA large    pH, UA 6.0 5.0 - 8.0   Protein, UA trace    Urobilinogen, UA 0.2 0.2 or 1.0 E.U./dL   Nitrite, UA neg    Leukocytes, UA Small (1+) (A) Negative       Assessment & Plan:  1. Cystitis - culture preliminary result shows coagulase negative staph. Sensitivities not yet available, Will go ahead and change antibiotic from cipro to bactrim - sulfamethoxazole-trimethoprim (BACTRIM DS,SEPTRA DS) 800-160  MG tablet; Take 1 tablet by mouth 2 (two) times daily.  Dispense: 6 tablet; Refill: 0  2. Vaginal itching - discharge has improved, she has one more dose of diflucan to take, she was instructed to use OTC antifungal for itching.   - RTC/follow up if no improvement in 3-4 days   Olean Ree, FNP-BC  [facility], [facility]  07/12/2017 2:50 PM

## 2017-07-12 NOTE — Telephone Encounter (Signed)
Copied from CRM [id number]. Topic: Inquiry >> Jul 11, 2017 12:24 PM Landry MellowFoltz, Melissa J wrote: Reason for CRM: pt would like to know if STD testing is done by blood test, urine test, or other. Pt request cb [phone]

## 2017-07-12 NOTE — Patient Instructions (Signed)
Please stop cipro  I have sent in a new antibiotic to your pharmacy  Can try some over the counter ointment (Monistat, etc) for itching

## 2017-07-13 LAB — URINE CULTURE
MICRO NUMBER: 81190972
SPECIMEN QUALITY:: ADEQUATE

## 2017-07-19 ENCOUNTER — Telehealth: Payer: Self-pay

## 2017-07-19 NOTE — Telephone Encounter (Signed)
Copied from CRM [id number]. Topic: Appointment Scheduling - Scheduling Inquiry for Clinic >> Jul 19, 2017  3:17 PM Terisa Starraylor, Brittany L wrote: Reason for CRM: patient wants to know if she can come in for a blood pregnancy test. She says that her OB says they do not do it in their office. She said she took two urine test and they both are negative but she is over two weeks late with her period.  >> Jul 19, 2017  3:28 PM Patience MuscaIsley, Bettie Swavely M, LPN wrote: Pt was seen by Pamala Hurry Baity NP on 07/10/17 with urinary urgency and noted sexually active; no condoms; pt seen 10/26 with cystitis and vaginal itching by Harlin Heys Gessner FNP. Please advise.

## 2017-07-19 NOTE — Telephone Encounter (Signed)
She can make an appt to be seen and we can order serum HCG at that time.

## 2017-07-24 ENCOUNTER — Ambulatory Visit: Payer: Self-pay | Admitting: Nurse Practitioner

## 2017-08-16 ENCOUNTER — Encounter: Payer: BLUE CROSS/BLUE SHIELD | Admitting: Internal Medicine

## 2017-12-13 ENCOUNTER — Other Ambulatory Visit: Payer: Self-pay | Admitting: Internal Medicine

## 2017-12-16 ENCOUNTER — Encounter: Payer: Self-pay | Admitting: Internal Medicine

## 2017-12-16 ENCOUNTER — Ambulatory Visit (INDEPENDENT_AMBULATORY_CARE_PROVIDER_SITE_OTHER): Payer: No Typology Code available for payment source | Admitting: Internal Medicine

## 2017-12-16 DIAGNOSIS — G43C1 Periodic headache syndromes in child or adult, intractable: Secondary | ICD-10-CM | POA: Diagnosis not present

## 2017-12-16 DIAGNOSIS — G43909 Migraine, unspecified, not intractable, without status migrainosus: Secondary | ICD-10-CM | POA: Insufficient documentation

## 2017-12-16 MED ORDER — TOPIRAMATE 25 MG PO CPSP: 25.0000 mg | ORAL_CAPSULE | Freq: Every day | ORAL | 2 refills | Status: DC

## 2017-12-16 NOTE — Progress Notes (Signed)
Subjective:    Patient ID: Danielle Morgan, female    DOB: Dec 11, 1995, 22 y.o.   MRN: [id number]  HPI  Pt presents to the clinic today to discuss her migraines. This has been going on for years. She reports the migraines are usually located behind one of her eyes. She describes the pain as throbbing. The pain does not radiate. She has associated nausea and sensitivity to light but denies dizziness, visual changes, sensitivity to sound or vomiting. She reports her migraines are triggered by poor diet, lack of sleep, stress, and hormonal changes. They often occur around the time of her period. She got the Nexplanon but she reports that has not helped with her migraines and she is concerned about the weight gain it has caused. She has taken Flexeril and Tylenol with some relief. She has never seen a neurologist. Her mother also suffers from migraines.  Review of Systems      Past Medical History:  Diagnosis Date  . Frequent headaches     Current Outpatient Medications  Medication Sig Dispense Refill  . cyclobenzaprine (FLEXERIL) 10 MG tablet Take 1 tablet (10 mg total) by mouth 3 (three) times daily as needed for muscle spasms. 30 tablet 0  . etonogestrel (NEXPLANON) 68 MG IMPL implant 1 each by Subdermal route once.    . topiramate (TOPAMAX) 25 MG capsule Take 1 capsule (25 mg total) by mouth daily. 30 capsule 2   No current facility-administered medications for this visit.     No Known Allergies  Family History  Problem Relation Age of Onset  . Arthritis Mother   . Bipolar disorder Father   . Cancer Paternal Grandmother        Lung  . ADD / ADHD Sister   . ADD / ADHD Brother   . Heart disease Maternal Uncle   . Arthritis Maternal Grandfather   . Hypertension Maternal Grandfather   . Alcohol abuse Maternal Grandfather     Social History   Socioeconomic History  . Marital status: Single    Spouse name: Not on file  . Number of children: Not on file  . Years of education:  Not on file  . Highest education level: Not on file  Occupational History  . Not on file  Social Needs  . Financial resource strain: Not on file  . Food insecurity:    Worry: Not on file    Inability: Not on file  . Transportation needs:    Medical: Not on file    Non-medical: Not on file  Tobacco Use  . Smoking status: Never Smoker  . Smokeless tobacco: Never Used  Substance and Sexual Activity  . Alcohol use: No    Alcohol/week: 0.0 oz  . Drug use: No  . Sexual activity: Not Currently    Birth control/protection: None  Lifestyle  . Physical activity:    Days per week: Not on file    Minutes per session: Not on file  . Stress: Not on file  Relationships  . Social connections:    Talks on phone: Not on file    Gets together: Not on file    Attends religious service: Not on file    Active member of club or organization: Not on file    Attends meetings of clubs or organizations: Not on file    Relationship status: Not on file  . Intimate partner violence:    Fear of current or ex partner: Not on file    Emotionally  abused: Not on file    Physically abused: Not on file    Forced sexual activity: Not on file  Other Topics Concern  . Not on file  Social History Narrative  . Not on file     Constitutional: Pt reports headaches. Denies fever, malaise, fatigue, or abrupt weight changes.  HEENT: pt reports sensitivity to light. Denies eye pain, eye redness, ear pain, ringing in the ears, wax buildup, runny nose, nasal congestion, bloody nose, or sore throat. Respiratory: Denies difficulty breathing, shortness of breath, cough or sputum production.   Cardiovascular: Denies chest pain, chest tightness, palpitations or swelling in the hands or feet.  Gastrointestinal: Pt reports nausea. Denies abdominal pain, bloating, constipation, diarrhea or blood in the stool.  Neurological: Denies dizziness, difficulty with memory, difficulty with speech or problems with balance and  coordination.    No other specific complaints in a complete review of systems (except as listed in HPI above).  Objective:   Physical Exam  BP 116/78   Pulse 87   Temp 98.1 F (36.7 C) (Oral)   Wt 170 lb (77.1 kg)   SpO2 98%   BMI 27.86 kg/m  Wt Readings from Last 3 Encounters:  12/16/17 170 lb (77.1 kg)  07/12/17 168 lb 8 oz (76.4 kg)  07/10/17 170 lb (77.1 kg)    General: Appears her stated age, well developed, well nourished in NAD. HEENT: Head: normal shape and size; Eyes: PERRLA and EOMs intact; Cardiovascular: Normal rate and rhythm. S1,S2 noted.  No murmur, rubs or gallops noted.  Pulmonary/Chest: Normal effort and positive vesicular breath sounds. No respiratory distress. No wheezes, rales or ronchi noted.  Neurological: Alert and oriented. Coordination normal.    BMET    Component Value Date/Time   NA 136 04/25/2017 0938   K 4.4 04/25/2017 0938   CL 102 04/25/2017 0938   CO2 25 12/11/2016 1045   GLUCOSE 76 04/25/2017 0938   GLUCOSE 87 12/11/2016 1045   BUN 8 04/25/2017 0938   CREATININE 0.61 04/25/2017 0938   CALCIUM 9.2 04/25/2017 0938   GFRNONAA 130 04/25/2017 0938   GFRAA 150 04/25/2017 0938    Lipid Panel     Component Value Date/Time   CHOL 162 04/25/2017 0938   TRIG 70 04/25/2017 0938   HDL 55 04/25/2017 0938   CHOLHDL 2.9 04/25/2017 0938   CHOLHDL 4 12/11/2016 1045   VLDL 20.4 12/11/2016 1045   LDLCALC 93 04/25/2017 0938    CBC    Component Value Date/Time   WBC 6.2 04/25/2017 0938   WBC 6.4 12/11/2016 1045   RBC 4.35 04/25/2017 0938   RBC 4.59 12/11/2016 1045   HGB 11.7 04/25/2017 0938   HCT 37.7 04/25/2017 0938   PLT 259 04/25/2017 0938   MCV 87 04/25/2017 0938   MCH 26.9 04/25/2017 0938   MCH 23.9 (L) 11/16/2015 0545   MCHC 31.0 (L) 04/25/2017 0938   MCHC 32.9 12/11/2016 1045   RDW 14.7 04/25/2017 0938   LYMPHSABS 1.7 04/25/2017 0938   MONOABS 0.9 04/26/2015 1053   EOSABS 0.0 04/25/2017 0938   BASOSABS 0.0 04/25/2017 0938      Hgb A1C Lab Results  Component Value Date   HGBA1C 5.1 04/25/2017            Assessment & Plan:

## 2017-12-16 NOTE — Assessment & Plan Note (Signed)
We discussed her triggers and the importance of avoiding those Encouraged her to get her eyes examined just to make sure this was not an issue for her Discussed treatment options, Topamax vs Amitriptyline, side effects and benefits of each Will trial Topamax, eRx for 25 mg QHS  She will update me in 3 weeks and let me know how she is doing.

## 2017-12-16 NOTE — Patient Instructions (Signed)

## 2018-01-02 ENCOUNTER — Encounter: Payer: Self-pay | Admitting: Registered Nurse

## 2018-01-02 ENCOUNTER — Ambulatory Visit: Payer: Self-pay | Admitting: Registered Nurse

## 2018-01-02 ENCOUNTER — Ambulatory Visit: Payer: Self-pay

## 2018-01-02 VITALS — BP 114/79 | HR 65 | Temp 97.9°F

## 2018-01-02 DIAGNOSIS — J019 Acute sinusitis, unspecified: Secondary | ICD-10-CM

## 2018-01-02 DIAGNOSIS — R220 Localized swelling, mass and lump, head: Secondary | ICD-10-CM

## 2018-01-02 DIAGNOSIS — H6983 Other specified disorders of Eustachian tube, bilateral: Secondary | ICD-10-CM

## 2018-01-02 MED ORDER — AMOXICILLIN-POT CLAVULANATE 875-125 MG PO TABS: 1.0000 | ORAL_TABLET | Freq: Two times a day (BID) | ORAL | 0 refills | Status: DC

## 2018-01-02 MED ORDER — FLUTICASONE PROPIONATE 50 MCG/ACT NA SUSP: 1.0000 | Freq: Two times a day (BID) | NASAL | 6 refills | Status: DC

## 2018-01-02 MED ORDER — CETIRIZINE HCL 10 MG PO TABS: 10.0000 mg | ORAL_TABLET | Freq: Every day | ORAL | 1 refills | Status: DC

## 2018-01-02 MED ORDER — TRIPLE ANTIBIOTIC 5-400-5000 EX OINT: TOPICAL_OINTMENT | Freq: Two times a day (BID) | CUTANEOUS | 0 refills | Status: DC | PRN

## 2018-01-02 MED ORDER — SALINE SPRAY 0.65 % NA SOLN: 2.0000 | NASAL | 0 refills | Status: DC

## 2018-01-02 MED ORDER — RANITIDINE HCL 150 MG PO TABS: 150.0000 mg | ORAL_TABLET | Freq: Two times a day (BID) | ORAL | 0 refills | Status: DC

## 2018-01-02 NOTE — Progress Notes (Signed)
Subjective:    Patient ID: Danielle Morgan, female    DOB: [birth year], 22 y.o.   MRN: [id number]  22y/o new single caucasian female pt c/o slight swelling to R lower lip and a sensation in throat that she has difficulty describing. States she noticed sx upon waking this am. Started Topamax 2 nights ago taking oral at bedtime as a daily migraine preventative. 2 doses so far. Lip has tingling sensation, but not completely numb. States throat is not swollen, numb, denies difficulty breathing or swallowing. Denies any tongue swelling or abnormal sensations. Concerned for an allergic rxn to new medication. No previous medication allergic rxns. Also reports she was brushing her teeth this morning and spit out frank blood, but did not notice gum swelling, tenderness, bleeding around gumline or any lesions/sore spots.  Lower lip skin is cracked but did not note bleeding from her lip. Unsure if this is related or not. Called pcp and was advised to see UC but did not feel that level of care was necessary at this point.  Patient also denied nausea/vomiting/diarrhea/rash/headache/visual changes/cough/fever or chills.  Denied use of new hygiene/luandry or cleaning products.  She has been having some spring allergy symptoms but had no started any medication.  Her headache frequency has been more often in the past month; prior to implanted contraceptive headaches always occurred with her menstrual cycle also.  Patient reported she had pimple near lower lip but did not think it was related to her lip swelling or bloody spit.  Patient had also tried fioricet for headaches and mother with migraines also on medicine but unsure of name of medicine.     Review of Systems  Constitutional: Negative for activity change, appetite change, chills, diaphoresis, fatigue, fever and unexpected weight change.  HENT: Positive for facial swelling, postnasal drip and rhinorrhea. Negative for congestion, dental problem, drooling, ear  discharge, ear pain, hearing loss, mouth sores, nosebleeds, sinus pressure, sinus pain, sneezing, sore throat, tinnitus, trouble swallowing and voice change.   Eyes: Negative for photophobia, pain, discharge, redness, itching and visual disturbance.  Respiratory: Negative for cough, choking, chest tightness, shortness of breath, wheezing and stridor.   Cardiovascular: Negative for chest pain, palpitations and leg swelling.  Gastrointestinal: Negative for abdominal distention, abdominal pain, blood in stool, constipation, diarrhea, nausea and vomiting.  Endocrine: Negative for cold intolerance and heat intolerance.  Genitourinary: Negative for difficulty urinating, dysuria and hematuria.  Musculoskeletal: Negative for arthralgias, back pain, gait problem, joint swelling, myalgias, neck pain and neck stiffness.  Skin: Negative for color change, pallor, rash and wound.  Allergic/Immunologic: Positive for environmental allergies. Negative for food allergies.  Neurological: Positive for headaches. Negative for dizziness, tremors, seizures, syncope, facial asymmetry, speech difficulty, weakness, light-headedness and numbness.  Hematological: Negative for adenopathy. Does not bruise/bleed easily.  Psychiatric/Behavioral: Negative for agitation, confusion and sleep disturbance.       Objective:   Physical Exam  Constitutional: She is oriented to person, place, and time. Vital signs are normal. She appears well-developed and well-nourished. She is active and cooperative.  Non-toxic appearance. She does not have a sickly appearance. She does not appear ill. No distress.  HENT:  Head: Normocephalic and atraumatic.  Right Ear: Hearing, external ear and ear canal normal. A middle ear effusion is present.  Left Ear: Hearing, external ear and ear canal normal. A middle ear effusion is present.  Nose: Mucosal edema and rhinorrhea present. No nose lacerations, sinus tenderness, nasal deformity, septal  deviation or nasal septal hematoma.  No epistaxis.  No foreign bodies. Right sinus exhibits no maxillary sinus tenderness and no frontal sinus tenderness. Left sinus exhibits no maxillary sinus tenderness and no frontal sinus tenderness.  Mouth/Throat: Uvula is midline and mucous membranes are normal. Mucous membranes are not pale, not dry and not cyanotic. She does not have dentures. No oral lesions. No trismus in the jaw. Normal dentition. No dental abscesses, uvula swelling, lacerations or dental caries. Posterior oropharyngeal edema and posterior oropharyngeal erythema present. No oropharyngeal exudate or tonsillar abscesses.  Split lower lip linear vertical x4 largest centrally macular erythema nonpitting edema 0-1+/4 /slight puffiness; cobblstoning posterior pharynx; bilateral allergic shiners; bilateral TMs air fluid level right 50% opacity and left clear; bilateral nasal turbinates edema erythema and clear discharge; bilateral tonsils cryptic 2+/4 no exudate erythema and edema  Eyes: Pupils are equal, round, and reactive to light. Conjunctivae, EOM and lids are normal. Right eye exhibits no chemosis, no discharge, no exudate and no hordeolum. No foreign body present in the right eye. Left eye exhibits no chemosis, no discharge, no exudate and no hordeolum. No foreign body present in the left eye. Right conjunctiva is not injected. Right conjunctiva has no hemorrhage. Left conjunctiva is not injected. Left conjunctiva has no hemorrhage. No scleral icterus. Right eye exhibits normal extraocular motion and no nystagmus. Left eye exhibits normal extraocular motion and no nystagmus. Right pupil is round and reactive. Left pupil is round and reactive. Pupils are equal.  Neck: Trachea normal, normal range of motion and phonation normal. Neck supple. No tracheal tenderness and no muscular tenderness present. No neck rigidity. No tracheal deviation, no edema, no erythema and normal range of motion present. No  thyroid mass and no thyromegaly present.  Cardiovascular: Normal rate, regular rhythm, S1 normal, S2 normal, normal heart sounds and intact distal pulses. PMI is not displaced. Exam reveals no gallop, no distant heart sounds and no friction rub.  No murmur heard. Pulmonary/Chest: Effort normal and breath sounds normal. No accessory muscle usage or stridor. No respiratory distress. She has no decreased breath sounds. She has no wheezes. She has no rhonchi. She has no rales. She exhibits no tenderness.  No cough observed in exam room; spoke full sentences without difficulty  Abdominal: Soft. Normal appearance. She exhibits no distension, no fluid wave and no ascites. There is no rigidity and no guarding.  Musculoskeletal: Normal range of motion. She exhibits no edema or tenderness.       Right shoulder: Normal.       Left shoulder: Normal.       Right elbow: Normal.      Left elbow: Normal.       Right hip: Normal.       Left hip: Normal.       Right knee: Normal.       Left knee: Normal.       Cervical back: Normal.       Thoracic back: Normal.       Lumbar back: Normal.       Right hand: Normal.       Left hand: Normal.  Lymphadenopathy:       Head (right side): No submental, no submandibular, no tonsillar, no preauricular, no posterior auricular and no occipital adenopathy present.       Head (left side): No submental, no submandibular, no tonsillar, no preauricular, no posterior auricular and no occipital adenopathy present.    She has no cervical adenopathy.       Right cervical:  No superficial cervical, no deep cervical and no posterior cervical adenopathy present.      Left cervical: No superficial cervical, no deep cervical and no posterior cervical adenopathy present.  Neurological: She is alert and oriented to person, place, and time. She has normal strength. She is not disoriented. She displays no atrophy and no tremor. No cranial nerve deficit or sensory deficit. She exhibits  normal muscle tone. She displays no seizure activity. Coordination and gait normal. GCS eye subscore is 4. GCS verbal subscore is 5. GCS motor subscore is 6.  In/out of chair and on/off exam table without difficulty; gait sure and steady in hallway  Skin: Skin is warm, dry and intact. Rash noted. No abrasion, no bruising, no burn, no ecchymosis, no laceration, no lesion, no petechiae and no purpura noted. Rash is macular. Rash is not papular, not maculopapular, not nodular, not pustular, not vesicular and not urticarial. She is not diaphoretic. There is erythema. No cyanosis. No pallor. Nails show no clubbing.     Psychiatric: She has a normal mood and affect. Her speech is normal and behavior is normal. Judgment and thought content normal. Cognition and memory are normal.  Nursing note and vitals reviewed.         Assessment & Plan:  A-lip swelling, acute rhinosinusitis, eustachian tube dysfunction bilateral  P-will cover for angioedema and cellulitis of lower lip Augmentin 875mg  po BID x 7 days #20 RF0 dispensed from [facility] to patient.  Start if no improvement in lip swelling withwith triple antibiotic/zyrtec/benadryl and ranitidine today.  May apply ice to lower lip 5 minutes QID prn pain/swelling.  Patient to contact her NP's office to tell them she is holding topamax while on benadryl/zyrtec/ranitidine/augmentin.  Given 25mg  benadryl po in clinic and 3 more UD 25mg  tablets from clinic stock to take prn today.  Discussed if she feels lips swelling more to take another 25mg  immediately and notify RN Rolly Salter if at work.  Discussed ER precautions/call 911 if dyspnea/dysphagia/dysphasia/tongue swelling//SOB/wheezing/hemoptsis/nausea/vomiting/diarrhea/rash body wide.  Electronic Rx ranitidine 150mg  po BID #30 RF0 take for 7 days and restart if lip swelling worsens once she stops it.  To start zyrtec tonight 10mg  po daily.  Holding on epipen as not taking any more topamax at this time and think throat  related to post nasal drip and lip swelling from cellulitis skin integrity broken.  Discussed her implanted contraception decreased efficacy while taking augmentin.  Use alternative barrier contraception or abstinence.  Start applying triple antibiotic BID to lips until cracks healed.  Avoid lipstick/chapstick/make up on lips.  Avoid licking/biting/chewing lips.  If worsening lip irritation with triple antibiotic stop application and start no fragrance vaseline application BID topical until healed.  DDx drug reaction.  Exitcare handout on skin infection, angioedema, anaphylaxis given to patient. RTC if worsening erythema, pain, purulent discharge, fever, chills. Wash towels, washcloths, sheets in hot water with bleach every couple of days until infection resolved. Patient verbalized understanding, agreed with plan of care and had no further questions at this time.   Patient may use normal saline nasal spray 2 sprays each nostril q2h wa as needed for congestion 1 bottle given from clinic stock.  Electronic Rx. flonase 1 spray each nostril BID #1 RF6.  Patient denied personal or family history of ENT cancer.  OTC antihistamine of choice zyrtec 10mg  po daily.  Avoid triggers if possible.  Shower prior to bedtime if exposed to triggers.  If allergic dust/dust mites recommend mattress/pillow covers/encasements; washing linens,  vacuuming, sweeping, dusting weekly.  Call or return to clinic as needed if these symptoms worsen or fail to improve as anticipated.   Exitcare handout on allergic rhinitis and sinus rinse given to patient.  Patient verbalized understanding of instructions, agreed with plan of care and had no further questions at this time.  P2:  Avoidance and hand washing.  Cloudy fluid ear discussed with patient if pain/worsening pressure/fever/chills/ear discharge start augmentin 875mg  po BID x 10 days #20 RF0 dispensed from [facility] to patient.  Tylenol 1000mg  po QID prn pain.  Supportive treatment.   No  evidence of invasive bacterial infection, non toxic and well hydrated.  This is most likely self limiting viral infection.  I do not see where any further testing or imaging is necessary at this time.   I will suggest supportive care, rest, good hygiene and encourage the patient to take adequate fluids.  The patient is to return to clinic or EMERGENCY ROOM if symptoms worsen or change significantly e.g. ear pain, fever, purulent discharge from ears or bleeding.  Exitcare handout on otitis media given to patient.  Patient verbalized agreement and understanding of treatment plan and had no further questions at this time   Using fioricet too often so PCM changed her to topamax preventive after implantable contraceptive did not decrease headache frequency.  For acute pain, rest, and intermittent application of heat, analgesics, and PRN po NSAIDS. Avoid known triggers e.g. sleep deprivation, foods, stress, dehydration.  Keep hydrated and eat regular meals/snacks.  Patient engaged and planning to be married in 6 months.   If headache is the worst headache of entire life and came on like a clap of thunder patient was instructed to go to the Emergency Room. Call or return to clinic as needed if these symptoms worsen or fail to improve as anticipated. Exitcare handout on headaches given to patient and patient also instructed to maintain headache log and find out what medication her mother takes and notify me name of medication and  Follow up with her NP to discuss headache plan.  Discussed with patient to hold topamax unless her NP tells her to restart.  Discussed with patient depending on type of medication her mother is on I may or may not be able to prescribe for her depending on type due [facility] unable to prescribe [facility] health or controlled medications in this clinic.  Discussed with patient frequent severe weather fronts/wind/thunderstorms can be contributing to her headaches along  with allergy spring flare.  She was not treating her allergies and discussed with her management plan.with antihistamine/nasal saline/nasal steroid and showering prior to bedtime. Patient verbalized agreement and understanding of treatment plan and had no further questions at this time.  P2: Diet and fitness

## 2018-01-02 NOTE — Telephone Encounter (Signed)
Noted. Patient evaluated and treated at urgent care.

## 2018-01-02 NOTE — Telephone Encounter (Signed)
Pt. Reports she started Topamax 2 days ago and woke up this morning and noticed her bottom lip was swollen after brushing her teeth. States her tonsils "feel funny." No swelling to tongue. Denies difficulty swallowing, shortness of breath or chest pain. States lip swelling "has actually gotten better." Instructed to go to ED for evaluation. Refuses[facility] Answer Assessment - Initial Assessment Questions 1. ONSET: "When did the swelling start?" (e.g., minutes, hours, days)     This morning 2. SEVERITY: "How swollen is it?"     Bottom lip has gotten better 3. ITCHING: "Is there any itching?" If so, ask: "How much?"   (Scale 1-10; mild, moderate or severe)     No 4. PAIN: "Is the swelling painful to touch?" If so, ask: "How painful is it?"   (Scale 1-10; mild, moderate or severe)     No 5. CAUSE: "What do you think is causing the lip swelling?"     I started a new medicine 6. RECURRENT SYMPTOM: "Have you had lip swelling before?" If so, ask: "When was the last time?" "What happened that time?"     No 7. OTHER SYMPTOMS: "Do you have any other symptoms?" (e.g., toothache)     Tonsils "feels funny." 8. PREGNANCY: "Is there any chance you are pregnant?" "When was your last menstrual period?"     No  Protocols used: [facility]

## 2018-01-02 NOTE — Patient Instructions (Signed)
Eustachian Tube Dysfunction The eustachian tube connects the middle ear to the back of the nose. It regulates air pressure in the middle ear by allowing air to move between the ear and nose. It also helps to drain fluid from the middle ear space. When the eustachian tube does not function properly, air pressure, fluid, or both can build up in the middle ear. Eustachian tube dysfunction can affect one or both ears. What are the causes? This condition happens when the eustachian tube becomes blocked or cannot open normally. This may result from:  Ear infections.  Colds and other upper respiratory infections.  Allergies.  Irritation, such as from cigarette smoke or acid from the stomach coming up into the esophagus (gastroesophageal reflux).  Sudden changes in air pressure, such as from descending in an airplane.  Abnormal growths in the nose or throat, such as nasal polyps, tumors, or enlarged tissue at the back of the throat (adenoids).  What increases the risk? This condition may be more likely to develop in people who smoke and people who are overweight. Eustachian tube dysfunction may also be more likely to develop in children, especially children who have:  Certain birth defects of the mouth, such as cleft palate.  Large tonsils and adenoids.  What are the signs or symptoms? Symptoms of this condition may include:  A feeling of fullness in the ear.  Ear pain.  Clicking or popping noises in the ear.  Ringing in the ear.  Hearing loss.  Loss of balance.  Symptoms may get worse when the air pressure around you changes, such as when you travel to an area of high elevation or fly on an airplane. How is this diagnosed? This condition may be diagnosed based on:  Your symptoms.  A physical exam of your ear, nose, and throat.  Tests, such as those that measure: ? The movement of your eardrum (tympanogram). ? Your hearing (audiometry).  How is this treated? Treatment  depends on the cause and severity of your condition. If your symptoms are mild, you may be able to relieve your symptoms by moving air into ("popping") your ears. If you have symptoms of fluid in your ears, treatment may include:  Decongestants.  Antihistamines.  Nasal sprays or ear drops that contain medicines that reduce swelling (steroids).  In some cases, you may need to have a procedure to drain the fluid in your eardrum (myringotomy). In this procedure, a small tube is placed in the eardrum to:  Drain the fluid.  Restore the air in the middle ear space.  Follow these instructions at home:  Take over-the-counter and prescription medicines only as told by your health care provider.  Use techniques to help pop your ears as recommended by your health care provider. These may include: ? Chewing gum. ? Yawning. ? Frequent, forceful swallowing. ? Closing your mouth, holding your nose closed, and gently blowing as if you are trying to blow air out of your nose.  Do not do any of the following until your health care provider approves: ? Travel to high altitudes. ? Fly in airplanes. ? Work in a [employer] manager or room. ? Scuba dive.  Keep your ears dry. Dry your ears completely after showering or bathing.  Do not smoke.  Keep all follow-up visits as told by your health care provider. This is important. Contact a health care provider if:  Your symptoms do not go away after treatment.  Your symptoms come back after treatment.  You are  unable to pop your ears.  You have: ? A fever. ? Pain in your ear. ? Pain in your head or neck. ? Fluid draining from your ear.  Your hearing suddenly changes.  You become very dizzy.  You lose your balance. This information is not intended to replace advice given to you by your health care provider. Make sure you discuss any questions you have with your health care provider. Document Released: 09/30/2015 Document Revised: 02/09/2016  Document Reviewed: 09/22/2014 Elsevier Interactive Patient Education  2018 [employer]. Sinusitis, Adult Sinusitis is soreness and inflammation of your sinuses. Sinuses are hollow spaces in the bones around your face. Your sinuses are located:  Around your eyes.  In the middle of your forehead.  Behind your nose.  In your cheekbones.  Your sinuses and nasal passages are lined with a stringy fluid (mucus). Mucus normally drains out of your sinuses. When your nasal tissues become inflamed or swollen, the mucus can become trapped or blocked so air cannot flow through your sinuses. This allows bacteria, viruses, and funguses to grow, which leads to infection. Sinusitis can develop quickly and last for 7?10 days (acute) or for more than 12 weeks (chronic). Sinusitis often develops after a cold. What are the causes? This condition is caused by anything that creates swelling in the sinuses or stops mucus from draining, including:  Allergies.  Asthma.  Bacterial or viral infection.  Abnormally shaped bones between the nasal passages.  Nasal growths that contain mucus (nasal polyps).  Narrow sinus openings.  Pollutants, such as chemicals or irritants in the air.  A foreign object stuck in the nose.  A fungal infection. This is rare.  What increases the risk? The following factors may make you more likely to develop this condition:  Having allergies or asthma.  Having had a recent cold or respiratory tract infection.  Having structural deformities or blockages in your nose or sinuses.  Having a weak immune system.  Doing a lot of swimming or diving.  Overusing nasal sprays.  Smoking.  What are the signs or symptoms? The main symptoms of this condition are pain and a feeling of pressure around the affected sinuses. Other symptoms include:  Upper toothache.  Earache.  Headache.  Bad breath.  Decreased sense of smell and taste.  A cough that may get worse at  night.  Fatigue.  Fever.  Thick drainage from your nose. The drainage is often green and it may contain pus (purulent).  Stuffy nose or congestion.  Postnasal drip. This is when extra mucus collects in the throat or back of the nose.  Swelling and warmth over the affected sinuses.  Sore throat.  Sensitivity to light.  How is this diagnosed? This condition is diagnosed based on symptoms, a medical history, and a physical exam. To find out if your condition is acute or chronic, your health care provider may:  Look in your nose for signs of nasal polyps.  Tap over the affected sinus to check for signs of infection.  View the inside of your sinuses using an imaging device that has a light attached (endoscope).  If your health care provider suspects that you have chronic sinusitis, you may also:  Be tested for allergies.  Have a sample of mucus taken from your nose (nasal culture) and checked for bacteria.  Have a mucus sample examined to see if your sinusitis is related to an allergy.  If your sinusitis does not respond to treatment and it lasts longer  than 8 weeks, you may have an MRI or CT scan to check your sinuses. These scans also help to determine how severe your infection is. In rare cases, a bone biopsy may be done to rule out more serious types of fungal sinus disease. How is this treated? Treatment for sinusitis depends on the cause and whether your condition is chronic or acute. If a virus is causing your sinusitis, your symptoms will go away on their own within 10 days. You may be given medicines to relieve your symptoms, including:  Topical nasal decongestants. They shrink swollen nasal passages and let mucus drain from your sinuses.  Antihistamines. These drugs block inflammation that is triggered by allergies. This can help to ease swelling in your nose and sinuses.  Topical nasal corticosteroids. These are nasal sprays that ease inflammation and swelling in  your nose and sinuses.  Nasal saline washes. These rinses can help to get rid of thick mucus in your nose.  If your condition is caused by bacteria, you will be given an antibiotic medicine. If your condition is caused by a fungus, you will be given an antifungal medicine. Surgery may be needed to correct underlying conditions, such as narrow nasal passages. Surgery may also be needed to remove polyps. Follow these instructions at home: Medicines  Take, use, or apply over-the-counter and prescription medicines only as told by your health care provider. These may include nasal sprays.  If you were prescribed an antibiotic medicine, take it as told by your health care provider. Do not stop taking the antibiotic even if you start to feel better. Hydrate and Humidify  Drink enough water to keep your urine clear or pale yellow. Staying hydrated will help to thin your mucus.  Use a cool mist humidifier to keep the humidity level in your home above 50%.  Inhale steam for 10-15 minutes, 3-4 times a day or as told by your health care provider. You can do this in the bathroom while a hot shower is running.  Limit your exposure to cool or dry air. Rest  Rest as much as possible.  Sleep with your head raised (elevated).  Make sure to get enough sleep each night. General instructions  Apply a warm, moist washcloth to your face 3-4 times a day or as told by your health care provider. This will help with discomfort.  Wash your hands often with soap and water to reduce your exposure to viruses and other germs. If soap and water are not available, use hand sanitizer.  Do not smoke. Avoid being around people who are smoking (secondhand smoke).  Keep all follow-up visits as told by your health care provider. This is important. Contact a health care provider if:  You have a fever.  Your symptoms get worse.  Your symptoms do not improve within 10 days. Get help right away if:  You have a  severe headache.  You have persistent vomiting.  You have pain or swelling around your face or eyes.  You have vision problems.  You develop confusion.  Your neck is stiff.  You have trouble breathing. This information is not intended to replace advice given to you by your health care provider. Make sure you discuss any questions you have with your health care provider. Document Released: 09/03/2005 Document Revised: 04/29/2016 Document Reviewed: 06/29/2015 Elsevier Interactive Patient Education  2018 [employer]. Sinus Rinse What is a sinus rinse? A sinus rinse is a simple home treatment that is used to rinse your  sinuses with a sterile mixture of salt and water (saline solution). Sinuses are air-filled spaces in your skull behind the bones of your face and forehead that open into your nasal cavity. You will use the following:  Saline solution.  Neti pot or spray bottle. This releases the saline solution into your nose and through your sinuses. Neti pots and spray bottles can be purchased at [employer], a health food store, or online.  When would I do a sinus rinse? A sinus rinse can help to clear mucus, dirt, dust, or pollen from the nasal cavity. You may do a sinus rinse when you have a cold, a virus, nasal allergy symptoms, a sinus infection, or stuffiness in the nose or sinuses. If you are considering a sinus rinse:  Ask your child's health care provider before performing a sinus rinse on your child.  Do not do a sinus rinse if you have had ear or nasal surgery, ear infection, or blocked ears.  How do I do a sinus rinse?  Wash your hands.  Disinfect your device according to the directions provided and then dry it.  Use the solution that comes with your device or one that is sold separately in stores. Follow the mixing directions on the package.  Fill your device with the amount of saline solution as directed by the device instructions.  Stand over a sink and  tilt your head sideways over the sink.  Place the spout of the device in your upper nostril (the one closer to the ceiling).  Gently pour or squeeze the saline solution into the nasal cavity. The liquid should drain to the lower nostril if you are not overly congested.  Gently blow your nose. Blowing too hard may cause ear pain.  Repeat in the other nostril.  Clean and rinse your device with clean water and then air-dry it. Are there risks of a sinus rinse? Sinus rinse is generally very safe and effective. However, there are a few risks, which include:  A burning sensation in the sinuses. This may happen if you do not make the saline solution as directed. Make sure to follow all directions when making the saline solution.  Infection from contaminated water. This is rare, but possible.  Nasal irritation.  This information is not intended to replace advice given to you by your health care provider. Make sure you discuss any questions you have with your health care provider. Document Released: 03/31/2014 Document Revised: 07/31/2016 Document Reviewed: 01/19/2014 Elsevier Interactive Patient Education  2017 [employer] Angioedema Angioedema is the sudden swelling of tissue in the body. Angioedema can affect any part of the body, but it most often affects the deeper parts of the skin, causing red, itchy patches (hives) to appear over the affected area. It often begins during the night and is found in the morning. Depending on the cause, angioedema may happen:  Only once.  Several times. It may come back in unpredictable patterns.  Repeatedly for several years. Over time, it may gradually stop coming back.  Angioedema can be life-threatening if it affects the air passages that you breathe through. What are the causes? This condition may be caused by:  Foods, such as milk, eggs, shellfish, wheat, or nuts.  Certain medicines, such as ACE inhibitors, antibiotics, nonsteroidal  anti-inflammatory drugs, birth control pills, or dyes used in X-rays.  Insect stings.  Infections.  Angioedema can be inherited, and episodes can be triggered by:  Mild injury.  Dental work.  Surgery.  Stress.  Sudden changes in temperature.  Exercise.  In some cases, the cause of this condition is not known. What are the signs or symptoms? Symptoms of this condition depend on where the swelling happens. Symptoms may include:  Swollen skin.  Red, itchy patches of skin (hives).  Redness in the affected area.  Pain in the affected area.  Swollen lips or tongue.  Wheezing.  Breathing problems.  If your internal organs are involved, symptoms may also include:  Nausea.  Abdominal pain.  Vomiting.  Difficulty swallowing.  Difficulty passing urine.  How is this diagnosed? This condition may be diagnosed based on:  An exam of the affected area.  Your medical history.  Whether anyone in your family has had this condition before.  A review of any medicines you have been taking.  Tests, including: ? Allergy skin tests to see if the condition was caused by an allergic reaction. ? Blood tests to see if the condition was caused by a gene. ? Tests to check for underlying diseases that could cause the condition.  How is this treated? Treatment for this condition depends on the cause. It may involve any of the following:  If something triggered the condition, making changes to keep it from triggering the condition again.  If the condition affects your breathing, having tubes placed in your airway to keep it open.  Taking medicines to treat symptoms or prevent future episodes. These may include: ? Antihistamines. ? Epinephrine injections. ? Steroids.  If your condition is severe, you may need to be treated at the hospital. Angioedema usually gets better in 24-48 hours. Follow these instructions at home:  Take over-the-counter and prescription medicines  only as told by your health care provider.  If you were given medicines for emergency allergy treatment, always carry them with you.  Wear a medical bracelet as told by your health care provider.  If something triggers your condition, avoid the trigger, if possible.  If your condition is inherited and you are thinking about having children, talk to your health care provider. It is important to discuss the risks of passing on the condition to your children. Contact a health care provider if:  You have repeated episodes of angioedema.  Episodes of angioedema start to happen more often than they used to, even after you take steps to prevent them.  You have episodes of angioedema that are more severe than they have been before, even after you take steps to prevent them.  You are thinking about having children. Get help right away if:  You have severe swelling of your mouth, tongue, or lips.  You have trouble breathing.  You have trouble swallowing.  You faint. This information is not intended to replace advice given to you by your health care provider. Make sure you discuss any questions you have with your health care provider. Document Released: 11/12/2001 Document Revised: 03/31/2016 Document Reviewed: 03/13/2016 Elsevier Interactive Patient Education  2018 [employer]. Cellulitis, Adult Cellulitis is a skin infection. The infected area is usually red and tender. This condition occurs most often in the arms and lower legs. The infection can travel to the muscles, blood, and underlying tissue and become serious. It is very important to get treated for this condition. What are the causes? Cellulitis is caused by bacteria. The bacteria enter through a break in the skin, such as a cut, burn, insect bite, open sore, or crack. What increases the risk? This condition is more likely to occur in  people who:  Have a weak defense system (immune system).  Have open wounds on the skin such  as cuts, burns, bites, and scrapes. Bacteria can enter the body through these open wounds.  Are older.  Have diabetes.  Have a type of long-lasting (chronic) liver disease (cirrhosis) or kidney disease.  Use IV drugs.  What are the signs or symptoms? Symptoms of this condition include:  Redness, streaking, or spotting on the skin.  Swollen area of the skin.  Tenderness or pain when an area of the skin is touched.  Warm skin.  Fever.  Chills.  Blisters.  How is this diagnosed? This condition is diagnosed based on a medical history and physical exam. You may also have tests, including:  Blood tests.  Lab tests.  Imaging tests.  How is this treated? Treatment for this condition may include:  Medicines, such as antibiotic medicines or antihistamines.  Supportive care, such as rest and application of cold or warm cloths (cold or warm compresses) to the skin.  Hospital care, if the condition is severe.  The infection usually gets better within 1-2 days of treatment. Follow these instructions at home:  Take over-the-counter and prescription medicines only as told by your health care provider.  If you were prescribed an antibiotic medicine, take it as told by your health care provider. Do not stop taking the antibiotic even if you start to feel better.  Drink enough fluid to keep your urine clear or pale yellow.  Do not touch or rub the infected area.  Raise (elevate) the infected area above the level of your heart while you are sitting or lying down.  Apply warm or cold compresses to the affected area as told by your health care provider.  Keep all follow-up visits as told by your health care provider. This is important. These visits let your health care provider make sure a more serious infection is not developing. Contact a health care provider if:  You have a fever.  Your symptoms do not improve within 1-2 days of starting treatment.  Your bone or joint  underneath the infected area becomes painful after the skin has healed.  Your infection returns in the same area or another area.  You notice a swollen bump in the infected area.  You develop new symptoms.  You have a general ill feeling (malaise) with muscle aches and pains. Get help right away if:  Your symptoms get worse.  You feel very sleepy.  You develop vomiting or diarrhea that persists.  You notice red streaks coming from the infected area.  Your red area gets larger or turns dark in color. This information is not intended to replace advice given to you by your health care provider. Make sure you discuss any questions you have with your health care provider. Document Released: 06/13/2005 Document Revised: 01/12/2016 Document Reviewed: 07/13/2015 Elsevier Interactive Patient Education  2018 [employer]. Anaphylactic Reaction, Adult An anaphylactic reaction (anaphylaxis) is a sudden, severe allergic reaction that affects multiple areas of the body. Affected areas of the body may include the skin, mouth, lungs, heart, or gut (digestive system). Anaphylaxis can be life-threatening. This condition requires immediate medical attention, and sometimes hospitalization. What are the causes? This condition is caused by exposure to a substance that you are allergic to (allergen). In response to this exposure, the body releases proteins (antibodies) and other compounds, such as histamine, into the bloodstream. This causes swelling in certain tissues and loss of blood pressure to important  areas, such as the heart and lungs. Common allergens that can cause anaphylaxis include:  Medicines.  Foods, especially peanuts, wheat, shellfish, milk, and eggs.  Insect bites or stings.  Blood or parts of blood, including plasma, immunoglobulins, or serum.  Chemicals, such as dyes, latex, and contrast material that is used for medical tests.  What increases the risk? This condition is more  likely to occur in people who:  Have allergies.  Have had anaphylaxis before.  Have a family history of anaphylaxis.  Have certain medical conditions, including asthma and eczema.  What are the signs or symptoms? Symptoms of anaphylaxis include:  Nasal congestion.  Headache.  Flushed face.  Tingling in the mouth.  An itchy, red rash.  Swelling of the eyes, lips, face, or tongue.  Swelling of the back of the mouth and the throat.  Wheezing.  A hoarse voice.  Itchy, red, swollen areas of skin (hives).  Dizziness or light-headedness.  Fainting.  Anxiety or confusion.  Abdominal or chest pain.  Difficulty breathing, speaking, or swallowing.  Chest or throat tightness.  Fast or irregular heartbeats (palpitations).  Vomiting.  Diarrhea.  How is this diagnosed? This condition is diagnosed based on a physical exam and your history of recent exposure to allergens. You may be referred for follow-up testing by a health care provider who specializes in allergies. This testing can confirm the diagnosis and determine which substances you are allergic to. Testing may include:  Skin tests. These may involve: ? Injecting a small amount of the possible allergen between layers of your skin (intradermal injection). ? Applying patches to your skin.  Blood tests.  How is this treated? Emergency treatment may include:  Medicines that help: ? To relieve itching and hives (antihistamines). ? To reduce swelling (corticosteroids). ? To tighten your blood vessels and increase your heart rate (epinephrine).  Oxygen therapy to help you breathe.  Giving fluids through an IV tube.  Your health care provider may teach you how to use an anaphylaxis kit and how to give yourself an epinephrine injection with what is commonly called an auto-injector "pen" (pre-filled automatic epinephrine injection device). If you think that you are having an anaphylactic reaction, you should use  an auto-injector pen or an anaphylaxis kit. If you use epinephrine, you must still seek emergency medical treatment. Follow these instructions at home: Safety  Always keep an auto-injector pen or an anaphylaxis kit near you. These can be lifesaving if you have a severe anaphylactic reaction. Use your auto-injector pen or anaphylaxis kit as told by your health care provider.  Do not drive until your health care provider approves.  Make sure that you, the members of your household, and your employer know: ? How to use an anaphylaxis kit. ? How to use an auto-injector pen to give you an epinephrine injection.  Replace your epinephrine immediately after you use your auto-injector pen, in case you have another reaction.  Wear a medical alert bracelet or necklace that states your allergy, if told by your health care provider.  Learn the signs of anaphylaxis.  Work with your health care providers to come up with an anaphylaxis plan. Preparation is important. General instructions  Take over-the-counter and prescription medicines only as told by your health care provider.  If you have hives or a rash: ? Use an over-the-counter antihistamine as told by your health care provider. ? Apply cold, wet cloths (cold compresses) to your skin or take baths or showers in cool water. Avoid  hot water.  If you had tests done, it is your responsibility to get your test results. Ask your health care provider or the department performing the tests when your results will be ready.  Tell any health care providers who care for you that you have an allergy.  Keep all follow-up visits as told by your health care provider. This is important. How is this prevented?  Avoid allergens that have caused an anaphylactic reaction for you before.  When you are at a restaurant, tell your server that you have an allergy. If you are unsure whether a meal has an ingredient that you are allergic to, ask your server. Contact  a health care provider if:  You develop symptoms of an allergic reaction. You may notice them soon after you are exposed to a substance. The symptoms may include: ? Rash. ? Headache. ? Sneezing or a runny nose. ? Swelling. ? Nausea. ? Diarrhea. Get help right away if:  You needed to use epinephrine. ? An epinephrine injection helps to manage life-threatening allergic reactions, but you still need to go to the emergency room even if epinephrine seems to work. This is important because anaphylaxis may happen again within 72 hours (rebound anaphylaxis). ? If you used epinephrine to treat anaphylaxis outside of the hospital, you need additional medical care. This may include more doses of epinephrine.  You develop: ? A tight feeling in your chest or throat. ? Wheezing or difficulty breathing. ? Hives. ? Red skin or itching all over your body. ? Swelling in your lips, tongue, or the back of your throat.  You have severe vomiting or diarrhea.  You faint or you feel like you are going to faint. These symptoms may represent a serious problem that is an emergency. Do not wait to see if the symptoms will go away. Use your auto-injector pen or anaphylaxis kit as you have been instructed, and get medical help right away. Call your local emergency services (911 in the [place] Do not drive yourself to the hospital. This information is not intended to replace advice given to you by your health care provider. Make sure you discuss any questions you have with your health care provider. Document Released: 09/03/2005 Document Revised: 05/01/2016 Document Reviewed: 03/19/2016 Elsevier Interactive Patient Education  [employer].

## 2018-01-20 ENCOUNTER — Encounter: Payer: BLUE CROSS/BLUE SHIELD | Admitting: Internal Medicine

## 2018-01-20 NOTE — Progress Notes (Deleted)
Subjective:    Patient ID: Danielle Morgan, female    DOB: [birth year], 22 y.o.   MRN: [id number]  HPI  Pt presents to the clinic today for her annual exam.  Migraines: She is taking Topamax as prescribed.  Flu: 07/2016 Tetanus: 08/2015 Pap Smear: 01/2016 Dentist:  Diet: Exercise:  Review of Systems      Past Medical History:  Diagnosis Date  . Frequent headaches     Current Outpatient Medications  Medication Sig Dispense Refill  . amoxicillin-clavulanate (AUGMENTIN) 875-125 MG tablet Take 1 tablet by mouth 2 (two) times daily. 20 tablet 0  . cetirizine (ZYRTEC) 10 MG tablet Take 1 tablet (10 mg total) by mouth daily. 60 tablet 1  . cyclobenzaprine (FLEXERIL) 10 MG tablet Take 1 tablet (10 mg total) by mouth 3 (three) times daily as needed for muscle spasms. 30 tablet 0  . etonogestrel (NEXPLANON) 68 MG IMPL implant 1 each by Subdermal route once.    . fluticasone (FLONASE) 50 MCG/ACT nasal spray Place 1 spray into both nostrils 2 (two) times daily. 16 g 6  . neomycin-bacitracin-polymyxin (NEOSPORIN) [phone] ointment Apply topically 2 (two) times daily as needed for up to 7 doses. 28.3 g 0  . ranitidine (ZANTAC) 150 MG tablet Take 1 tablet (150 mg total) by mouth 2 (two) times daily. 30 tablet 0  . sodium chloride (OCEAN) 0.65 % SOLN nasal spray Place 2 sprays into both nostrils every 2 (two) hours while awake.  0  . topiramate (TOPAMAX) 25 MG capsule Take 1 capsule (25 mg total) by mouth daily. 30 capsule 2   No current facility-administered medications for this visit.     No Known Allergies  Family History  Problem Relation Age of Onset  . Arthritis Mother   . Bipolar disorder Father   . Cancer Paternal Grandmother        Lung  . ADD / ADHD Sister   . ADD / ADHD Brother   . Heart disease Maternal Uncle   . Arthritis Maternal Grandfather   . Hypertension Maternal Grandfather   . Alcohol abuse Maternal Grandfather     Social History   Socioeconomic History    . Marital status: Single    Spouse name: Not on file  . Number of children: Not on file  . Years of education: Not on file  . Highest education level: Not on file  Occupational History  . Not on file  Social Needs  . Financial resource strain: Not on file  . Food insecurity:    Worry: Not on file    Inability: Not on file  . Transportation needs:    Medical: Not on file    Non-medical: Not on file  Tobacco Use  . Smoking status: Never Smoker  . Smokeless tobacco: Never Used  Substance and Sexual Activity  . Alcohol use: No    Alcohol/week: 0.0 oz  . Drug use: No  . Sexual activity: Not Currently    Birth control/protection: None  Lifestyle  . Physical activity:    Days per week: Not on file    Minutes per session: Not on file  . Stress: Not on file  Relationships  . Social connections:    Talks on phone: Not on file    Gets together: Not on file    Attends religious service: Not on file    Active member of club or organization: Not on file    Attends meetings of clubs or organizations: Not on file  Relationship status: Not on file  . Intimate partner violence:    Fear of current or ex partner: Not on file    Emotionally abused: Not on file    Physically abused: Not on file    Forced sexual activity: Not on file  Other Topics Concern  . Not on file  Social History Narrative  . Not on file     Constitutional: Denies fever, malaise, fatigue, headache or abrupt weight changes.  HEENT: Denies eye pain, eye redness, ear pain, ringing in the ears, wax buildup, runny nose, nasal congestion, bloody nose, or sore throat. Respiratory: Denies difficulty breathing, shortness of breath, cough or sputum production.   Cardiovascular: Denies chest pain, chest tightness, palpitations or swelling in the hands or feet.  Gastrointestinal: Denies abdominal pain, bloating, constipation, diarrhea or blood in the stool.  GU: Denies urgency, frequency, pain with urination, burning  sensation, blood in urine, odor or discharge. Musculoskeletal: Denies decrease in range of motion, difficulty with gait, muscle pain or joint pain and swelling.  Skin: Denies redness, rashes, lesions or ulcercations.  Neurological: Denies dizziness, difficulty with memory, difficulty with speech or problems with balance and coordination.  Psych: Denies anxiety, depression, SI/HI.  No other specific complaints in a complete review of systems (except as listed in HPI above).  Objective:   Physical Exam        Assessment & Plan:

## 2018-03-06 ENCOUNTER — Ambulatory Visit (INDEPENDENT_AMBULATORY_CARE_PROVIDER_SITE_OTHER): Payer: No Typology Code available for payment source | Admitting: Obstetrics & Gynecology

## 2018-03-06 ENCOUNTER — Encounter: Payer: Self-pay | Admitting: Obstetrics & Gynecology

## 2018-03-06 VITALS — BP 131/86 | HR 73 | Wt 172.0 lb

## 2018-03-06 DIAGNOSIS — Z30430 Encounter for insertion of intrauterine contraceptive device: Secondary | ICD-10-CM

## 2018-03-06 DIAGNOSIS — Z3046 Encounter for surveillance of implantable subdermal contraceptive: Secondary | ICD-10-CM | POA: Diagnosis not present

## 2018-03-06 NOTE — Progress Notes (Signed)
Pt has nexplanon a little over 2 years now. No problems until about 3 months ago. She has had irregular cycles and weight gain. She has negative home UPT.

## 2018-03-06 NOTE — Progress Notes (Signed)
   Subjective:    Patient ID: Danielle Morgan, female    DOB: Oct 24, 1995, 22 y.o.   MRN: [id number]  HPI Danielle Morgan is a 22 yo engaged 62P1 (22 yo son) here because she has started having weight gain and irregular bleeding with her Nexplanon. She is getting married in about 2 months and doesn't want to be bleeding for her wedding. She declines condoms.   Review of Systems     Objective:   Physical Exam Breathing, conversing, and ambulating normally Well nourished, well hydrated White female, no apparent distress Consent was signed and time out was done. Her left arm was prepped with betadine after establishing the position of the Nexplanon. The area was infiltrated with 2 cc of 1% lidocaine. A small incision was made and the intact rod was easily removed and noted to be intact.  A steristrip was placed and her arm was noted to be hemostatic. It was bandaged.  She tolerated the procedure well. UPT negative, consent signed, Time out procedure done. Cervix prepped with betadine and grasped with a single tooth tenaculum. Liletta was easily placed and the strings were cut to 3-4 cm. Uterus sounded to 9 cm. She tolerated the procedure well.     Assessment & Plan:  Contraception- Liletta I gave her a sample of OCPs to be used prn irregular bleeding

## 2018-03-07 ENCOUNTER — Encounter: Payer: Self-pay | Admitting: Obstetrics & Gynecology

## 2018-03-07 ENCOUNTER — Ambulatory Visit (INDEPENDENT_AMBULATORY_CARE_PROVIDER_SITE_OTHER): Payer: No Typology Code available for payment source | Admitting: Obstetrics & Gynecology

## 2018-03-07 VITALS — BP 127/82 | HR 64 | Ht 65.0 in | Wt 174.4 lb

## 2018-03-07 DIAGNOSIS — Z30431 Encounter for routine checking of intrauterine contraceptive device: Secondary | ICD-10-CM

## 2018-03-07 NOTE — Progress Notes (Signed)
   Subjective:    Patient ID: Danielle Morgan, female    DOB: 1995-11-20, 22 y.o.   MRN: [id number]  HPI 22 yo engaged P1 here because she could feel her IUD string last night. She cannot feel it today. She would like it a little shorter. She has not had sex since yesterday. She no longer has any cramping.   Review of Systems     Objective:   Physical Exam Breathing, conversing, and ambulating normally Well nourished, well hydrated White female, no apparent distress IUD string about 3 cm long I trimmed it to about 2 cm in length    Assessment & Plan:  Contraception- Liletta Rec no sex for 2 weeks.

## 2018-03-07 NOTE — Progress Notes (Signed)
Pt here to have IUD string trimmed.

## 2018-04-09 ENCOUNTER — Other Ambulatory Visit: Payer: Self-pay | Admitting: Internal Medicine

## 2018-05-26 ENCOUNTER — Ambulatory Visit (INDEPENDENT_AMBULATORY_CARE_PROVIDER_SITE_OTHER): Payer: No Typology Code available for payment source | Admitting: Family Medicine

## 2018-05-26 ENCOUNTER — Encounter: Payer: Self-pay | Admitting: Family Medicine

## 2018-05-26 VITALS — BP 124/83 | HR 73 | Wt 174.0 lb

## 2018-05-26 DIAGNOSIS — Z30431 Encounter for routine checking of intrauterine contraceptive device: Secondary | ICD-10-CM | POA: Diagnosis not present

## 2018-05-26 DIAGNOSIS — Z124 Encounter for screening for malignant neoplasm of cervix: Secondary | ICD-10-CM | POA: Diagnosis not present

## 2018-05-26 DIAGNOSIS — Z113 Encounter for screening for infections with a predominantly sexual mode of transmission: Secondary | ICD-10-CM | POA: Diagnosis not present

## 2018-05-26 NOTE — Patient Instructions (Signed)

## 2018-05-26 NOTE — Progress Notes (Signed)
   Subjective:    Patient ID: Danielle Morgan is a 22 y.o. female presenting with String check  on 05/26/2018  HPI: Has Liletta in since 6/19. Reports feeling her IUD strings when she is having a BM. Partner does not feel them. She is not ready for another child. Reports cycles last 2 wks. Getting married on 9/13.  Review of Systems  Constitutional: Negative for chills and fever.  Respiratory: Negative for shortness of breath.   Cardiovascular: Negative for chest pain.  Gastrointestinal: Negative for abdominal pain, nausea and vomiting.  Genitourinary: Negative for dysuria.  Skin: Negative for rash.      Objective:    BP 124/83   Pulse 73   Wt 174 lb (78.9 kg)   BMI 28.96 kg/m  Physical Exam  Constitutional: She is oriented to person, place, and time. She appears well-developed and well-nourished. No distress.  HENT:  Head: Normocephalic and atraumatic.  Eyes: No scleral icterus.  Neck: Neck supple.  Cardiovascular: Normal rate.  Pulmonary/Chest: Effort normal.  Abdominal: Soft.  Genitourinary:  Genitourinary Comments: IUD strings noted  Neurological: She is alert and oriented to person, place, and time.  Skin: Skin is warm and dry.  Psychiatric: She has a normal mood and affect.        Assessment & Plan:  Screening for cervical cancer - Plan: Cytology - PAP  IUD check up - bleeding is normal--give it a few months. Trial of OC's if bleeding during her wedding. In planning for 2nd baby--timing of IUD removal discussed.   Total face-to-face time with patient: 15 minutes. Over 50% of encounter was spent on counseling and coordination of care. Return if symptoms worsen or fail to improve.  Reva Bores 05/26/2018 1:56 PM

## 2018-05-27 LAB — CYTOLOGY - PAP
CHLAMYDIA, DNA PROBE: NEGATIVE
DIAGNOSIS: NEGATIVE
Neisseria Gonorrhea: NEGATIVE

## 2018-06-27 ENCOUNTER — Encounter: Payer: Self-pay | Admitting: Internal Medicine

## 2018-06-27 ENCOUNTER — Ambulatory Visit (INDEPENDENT_AMBULATORY_CARE_PROVIDER_SITE_OTHER): Payer: No Typology Code available for payment source | Admitting: Internal Medicine

## 2018-06-27 VITALS — BP 122/82 | HR 92 | Temp 98.5°F | Wt 184.0 lb

## 2018-06-27 DIAGNOSIS — G43C1 Periodic headache syndromes in child or adult, intractable: Secondary | ICD-10-CM

## 2018-06-27 MED ORDER — CYCLOBENZAPRINE HCL 10 MG PO TABS: 10.0000 mg | ORAL_TABLET | Freq: Three times a day (TID) | ORAL | 0 refills | Status: DC | PRN

## 2018-06-27 NOTE — Patient Instructions (Signed)

## 2018-06-27 NOTE — Progress Notes (Signed)
Subjective:    Patient ID: Danielle Morgan, female    DOB: 03/26/1996, 22 y.o.   MRN: [id number]  HPI  Pt presents to the clinic today to discuss her migraines. She has had migraines for many years. She reports these occur 2-3 times per month. They are triggered by dust, noxious smells, dehydration, hormonal and stress. She reports associated dizziness, sensitivity to light and sound, nausea and vomiting. When she gets a migraine, she reports she will put an ice pack on her head, take Ibuprofen and Flexeril as needed with good relief. She was started on Topamax but reports after taking it for 2 days, it caused lip swelling so she stopped it.  She is not currently following with neurology.  Review of Systems  Past Medical History:  Diagnosis Date  . Frequent headaches     Current Outpatient Medications  Medication Sig Dispense Refill  . cyclobenzaprine (FLEXERIL) 10 MG tablet Take 1 tablet (10 mg total) by mouth 3 (three) times daily as needed for muscle spasms. 30 tablet 0   No current facility-administered medications for this visit.     No Known Allergies  Family History  Problem Relation Age of Onset  . Arthritis Mother   . Bipolar disorder Father   . Cancer Paternal Grandmother        Lung  . ADD / ADHD Sister   . ADD / ADHD Brother   . Heart disease Maternal Uncle   . Arthritis Maternal Grandfather   . Hypertension Maternal Grandfather   . Alcohol abuse Maternal Grandfather     Social History   Socioeconomic History  . Marital status: Single    Spouse name: Not on file  . Number of children: Not on file  . Years of education: Not on file  . Highest education level: Not on file  Occupational History  . Not on file  Social Needs  . Financial resource strain: Not on file  . Food insecurity:    Worry: Not on file    Inability: Not on file  . Transportation needs:    Medical: Not on file    Non-medical: Not on file  Tobacco Use  . Smoking status: Never Smoker    . Smokeless tobacco: Never Used  Substance and Sexual Activity  . Alcohol use: No    Alcohol/week: 0.0 standard drinks  . Drug use: No  . Sexual activity: Yes    Partners: Male    Birth control/protection: IUD  Lifestyle  . Physical activity:    Days per week: Not on file    Minutes per session: Not on file  . Stress: Not on file  Relationships  . Social connections:    Talks on phone: Not on file    Gets together: Not on file    Attends religious service: Not on file    Active member of club or organization: Not on file    Attends meetings of clubs or organizations: Not on file    Relationship status: Not on file  . Intimate partner violence:    Fear of current or ex partner: Not on file    Emotionally abused: Not on file    Physically abused: Not on file    Forced sexual activity: Not on file  Other Topics Concern  . Not on file  Social History Narrative  . Not on file     Constitutional: Pt reports migraines. Denies fever, malaise, fatigue, or abrupt weight changes.  HEENT: Pt reports sensitivity  to light and sound. Denies eye pain, eye redness, ear pain, ringing in the ears, wax buildup, runny nose, nasal congestion, bloody nose, or sore throat. Respiratory: Denies difficulty breathing, shortness of breath, cough or sputum production.   Cardiovascular: Denies chest pain, chest tightness, palpitations or swelling in the hands or feet.  Gastrointestinal: Pt reports nausea and vomiting. Denies abdominal pain, bloating, constipation, diarrhea or blood in the stool.  Neurological: Pt reports dizziness. Denies difficulty with memory, difficulty with speech or problems with balance and coordination.    No other specific complaints in a complete review of systems (except as listed in HPI above).     Objective:   Physical Exam   BP 122/82   Pulse 92   Temp 98.5 F (36.9 C) (Oral)   Wt 184 lb (83.5 kg)   SpO2 98%   BMI 30.62 kg/m  Wt Readings from Last 3  Encounters:  06/27/18 184 lb (83.5 kg)  05/26/18 174 lb (78.9 kg)  03/07/18 174 lb 6.4 oz (79.1 kg)    General: Appears her stated age, well developed, well nourished in NAD. HEENT: Head: normal shape and size; Eyes: PERRLA and EOMs intact; Cardiovascular: Normal rate and rhythm. S1,S2 noted.  No murmur, rubs or gallops noted. N Pulmonary/Chest: Normal effort and positive vesicular breath sounds. No respiratory distress. No wheezes, rales or ronchi noted.  Neurological: Alert and oriented.  Coordination normal.    BMET    Component Value Date/Time   NA 136 04/25/2017 0938   K 4.4 04/25/2017 0938   CL 102 04/25/2017 0938   CO2 25 12/11/2016 1045   GLUCOSE 76 04/25/2017 0938   GLUCOSE 87 12/11/2016 1045   BUN 8 04/25/2017 0938   CREATININE 0.61 04/25/2017 0938   CALCIUM 9.2 04/25/2017 0938   GFRNONAA 130 04/25/2017 0938   GFRAA 150 04/25/2017 0938    Lipid Panel     Component Value Date/Time   CHOL 162 04/25/2017 0938   TRIG 70 04/25/2017 0938   HDL 55 04/25/2017 0938   CHOLHDL 2.9 04/25/2017 0938   CHOLHDL 4 12/11/2016 1045   VLDL 20.4 12/11/2016 1045   LDLCALC 93 04/25/2017 0938    CBC    Component Value Date/Time   WBC 6.2 04/25/2017 0938   WBC 6.4 12/11/2016 1045   RBC 4.35 04/25/2017 0938   RBC 4.59 12/11/2016 1045   HGB 11.7 04/25/2017 0938   HCT 37.7 04/25/2017 0938   PLT 259 04/25/2017 0938   MCV 87 04/25/2017 0938   MCH 26.9 04/25/2017 0938   MCH 23.9 (L) 11/16/2015 0545   MCHC 31.0 (L) 04/25/2017 0938   MCHC 32.9 12/11/2016 1045   RDW 14.7 04/25/2017 0938   LYMPHSABS 1.7 04/25/2017 0938   MONOABS 0.9 04/26/2015 1053   EOSABS 0.0 04/25/2017 0938   BASOSABS 0.0 04/25/2017 0938    Hgb A1C Lab Results  Component Value Date   HGBA1C 5.1 04/25/2017           Assessment & Plan:   Migraines:  Letter to her employer discussing her migraines Advised her if she needs FMLA, to fax me the forms and I would fill them out Flexeril refilled  today Flu shot today  Return precautions discussed Nicki Reaper, NP

## 2018-08-01 ENCOUNTER — Ambulatory Visit: Payer: No Typology Code available for payment source | Admitting: Family Medicine

## 2018-08-07 ENCOUNTER — Telehealth: Payer: Self-pay | Admitting: Registered Nurse

## 2018-08-07 ENCOUNTER — Encounter: Payer: Self-pay | Admitting: Registered Nurse

## 2018-08-07 MED ORDER — FLUCONAZOLE 150 MG PO TABS: ORAL_TABLET | ORAL | 0 refills | Status: DC

## 2018-08-07 NOTE — Telephone Encounter (Signed)
Patient had IUD placed this summer and menses duration and frequency has changed this month 1 week and lighter than normal menses initially menses every 2 weeks lengthened to every 3 weeks but lighter flow.   Patient denied abdomen and pelvic pain.  Stated she has frequent yeast infections typically OTC clears.  Symptoms started last week got a little better than worsened over the weekend.  She started monistat and feeling better but unsure if discharge completely cleared as menses started.  Traveling next week and sometimes OTC monistat doesn't completely resolve vaginal yeast infection requested refill of diflucan.  Last diflucan required Oct 2018.  Denied side effects/adverse reactions with previous use.  Electronic Rx sent to her pharmacy of choice.  PE WNL pelvic deferred as contract in this clinic prohibits pelvic exams.  abd snd nt hypoactive bowel sounds x 4 quads and dull to percussion x 4 quads. Skin warm dry and pink no bloating noted.  Follow up if new or worsening symptoms not resolved with plan of care. Diflucan 150mg  po x 1 now and may repeat in 72 hours if symptoms not completely cleared.  Stop monistat.  Avoid douching, wear cotton underwear.  Avoid sex while having discharge.  Patient verbalized understanding information/instructions, agreed with plan of care and had no further questions at this time.

## 2018-08-21 DIAGNOSIS — R51 Headache: Secondary | ICD-10-CM | POA: Diagnosis not present

## 2018-08-21 DIAGNOSIS — J300 Vasomotor rhinitis: Secondary | ICD-10-CM | POA: Diagnosis not present

## 2018-08-21 DIAGNOSIS — J309 Allergic rhinitis, unspecified: Secondary | ICD-10-CM | POA: Diagnosis not present

## 2018-08-22 ENCOUNTER — Encounter: Payer: Self-pay | Admitting: Internal Medicine

## 2018-10-01 ENCOUNTER — Telehealth: Payer: Self-pay | Admitting: Internal Medicine

## 2018-10-01 DIAGNOSIS — Z0279 Encounter for issue of other medical certificate: Secondary | ICD-10-CM

## 2018-10-01 NOTE — Telephone Encounter (Signed)
Pt dropped off fmla paperwork For chronic migraines She stated she gets migraines 2/3 times a month and it last 1/2 days at time  Paperwork in [facility] in box for review and signature

## 2018-10-01 NOTE — Telephone Encounter (Signed)
Done, given back to Robin 

## 2018-10-02 NOTE — Telephone Encounter (Signed)
Paperwork faxed °

## 2018-10-02 NOTE — Telephone Encounter (Signed)
Left message letting pt know paperwork had been faxed and a copy here for her  Copy for pt Copy for scan Copy for billing

## 2018-10-09 ENCOUNTER — Encounter: Payer: Self-pay | Admitting: Internal Medicine

## 2018-10-09 ENCOUNTER — Ambulatory Visit (INDEPENDENT_AMBULATORY_CARE_PROVIDER_SITE_OTHER): Payer: No Typology Code available for payment source | Admitting: Internal Medicine

## 2018-10-09 VITALS — BP 122/76 | HR 114 | Temp 98.4°F | Wt 186.0 lb

## 2018-10-09 DIAGNOSIS — R197 Diarrhea, unspecified: Secondary | ICD-10-CM | POA: Diagnosis not present

## 2018-10-09 DIAGNOSIS — J029 Acute pharyngitis, unspecified: Secondary | ICD-10-CM

## 2018-10-09 DIAGNOSIS — J020 Streptococcal pharyngitis: Secondary | ICD-10-CM | POA: Diagnosis not present

## 2018-10-09 DIAGNOSIS — R6883 Chills (without fever): Secondary | ICD-10-CM

## 2018-10-09 LAB — POCT RAPID STREP A (OFFICE): Rapid Strep A Screen: POSITIVE — AB

## 2018-10-09 MED ORDER — AMOXICILLIN 500 MG PO CAPS: 500.0000 mg | ORAL_CAPSULE | Freq: Three times a day (TID) | ORAL | 0 refills | Status: DC

## 2018-10-09 NOTE — Patient Instructions (Signed)

## 2018-10-09 NOTE — Progress Notes (Signed)
Subjective:    Patient ID: Danielle Morgan, female    DOB: Dec 15, 1995, 23 y.o.   MRN: [id number]  HPI  Pt presents to the clinic today with c/o sore throat, loose stools and chills. She reports this started yesterday. She is having difficulty swallowing. She denies runny nose, nasal congestion, ear pain or cough. She denies nausea, vomiting, abdominal pain or blood in stool. She denies fever or body aches. She has not taken anything OTC for this. She has had sick contacts diagnosed with the flu. She did not get her flu shot this year.  Review of Systems      Past Medical History:  Diagnosis Date  . Frequent headaches     Current Outpatient Medications  Medication Sig Dispense Refill  . cyclobenzaprine (FLEXERIL) 10 MG tablet Take 1 tablet (10 mg total) by mouth 3 (three) times daily as needed for muscle spasms. 30 tablet 0   No current facility-administered medications for this visit.     No Known Allergies  Family History  Problem Relation Age of Onset  . Arthritis Mother   . Bipolar disorder Father   . Cancer Paternal Grandmother        Lung  . ADD / ADHD Sister   . ADD / ADHD Brother   . Heart disease Maternal Uncle   . Arthritis Maternal Grandfather   . Hypertension Maternal Grandfather   . Alcohol abuse Maternal Grandfather     Social History   Socioeconomic History  . Marital status: Single    Spouse name: Not on file  . Number of children: Not on file  . Years of education: Not on file  . Highest education level: Not on file  Occupational History  . Not on file  Social Needs  . Financial resource strain: Not on file  . Food insecurity:    Worry: Not on file    Inability: Not on file  . Transportation needs:    Medical: Not on file    Non-medical: Not on file  Tobacco Use  . Smoking status: Never Smoker  . Smokeless tobacco: Never Used  Substance and Sexual Activity  . Alcohol use: No    Alcohol/week: 0.0 standard drinks  . Drug use: No  . Sexual  activity: Yes    Partners: Male    Birth control/protection: I.U.D.  Lifestyle  . Physical activity:    Days per week: Not on file    Minutes per session: Not on file  . Stress: Not on file  Relationships  . Social connections:    Talks on phone: Not on file    Gets together: Not on file    Attends religious service: Not on file    Active member of club or organization: Not on file    Attends meetings of clubs or organizations: Not on file    Relationship status: Not on file  . Intimate partner violence:    Fear of current or ex partner: Not on file    Emotionally abused: Not on file    Physically abused: Not on file    Forced sexual activity: Not on file  Other Topics Concern  . Not on file  Social History Narrative  . Not on file     Constitutional: Pt reports chills. Denies fever, malaise, fatigue, headache or abrupt weight changes.  HEENT: Pt reports sore throat. Denies eye pain, eye redness, ear pain, ringing in the ears, wax buildup, runny nose, nasal congestion, bloody nose. Respiratory: Denies  difficulty breathing, shortness of breath, cough or sputum production.   Cardiovascular: Denies chest pain, chest tightness, palpitations or swelling in the hands or feet.  Gastrointestinal: Pt reports diarrhea. Denies abdominal pain, bloating, constipation,  or blood in the stool.   No other specific complaints in a complete review of systems (except as listed in HPI above).  Objective:   Physical Exam  BP 122/76   Pulse (!) 114   Temp 98.4 F (36.9 C) (Oral)   Wt 186 lb (84.4 kg)   SpO2 98%   BMI 30.95 kg/m  Wt Readings from Last 3 Encounters:  10/09/18 186 lb (84.4 kg)  06/27/18 184 lb (83.5 kg)  05/26/18 174 lb (78.9 kg)    General: Appears her  stated age, well developed, well nourished in NAD. Skin: Warm, dry and intact. No rashes noted. HEENT: Head: normal shape and size, no sinus tenderness noted; Ears: Tm's gray and intact, normal light reflex; Nose:  mucosa pink and moist, septum midline; Throat/Mouth: Teeth present, mucosa erythmatous and moist, tonsils 2+ with mild exudate noted. No lesions or ulcerations noted.  Neck:  Bilateral anterior cervical adenopathy.  Cardiovascular: Tachycardic with normal rhythm.  Pulmonary/Chest: Normal effort and positive vesicular breath sounds. No respiratory distress. No wheezes, rales or ronchi noted.  Abdomen: Soft and nontender. Normal bowel sounds. No distention or masses noted.    BMET    Component Value Date/Time   NA 136 04/25/2017 0938   K 4.4 04/25/2017 0938   CL 102 04/25/2017 0938   CO2 25 12/11/2016 1045   GLUCOSE 76 04/25/2017 0938   GLUCOSE 87 12/11/2016 1045   BUN 8 04/25/2017 0938   CREATININE 0.61 04/25/2017 0938   CALCIUM 9.2 04/25/2017 0938   GFRNONAA 130 04/25/2017 0938   GFRAA 150 04/25/2017 0938    Lipid Panel     Component Value Date/Time   CHOL 162 04/25/2017 0938   TRIG 70 04/25/2017 0938   HDL 55 04/25/2017 0938   CHOLHDL 2.9 04/25/2017 0938   CHOLHDL 4 12/11/2016 1045   VLDL 20.4 12/11/2016 1045   LDLCALC 93 04/25/2017 0938    CBC    Component Value Date/Time   WBC 6.2 04/25/2017 0938   WBC 6.4 12/11/2016 1045   RBC 4.35 04/25/2017 0938   RBC 4.59 12/11/2016 1045   HGB 11.7 04/25/2017 0938   HCT 37.7 04/25/2017 0938   PLT 259 04/25/2017 0938   MCV 87 04/25/2017 0938   MCH 26.9 04/25/2017 0938   MCH 23.9 (L) 11/16/2015 0545   MCHC 31.0 (L) 04/25/2017 0938   MCHC 32.9 12/11/2016 1045   RDW 14.7 04/25/2017 0938   LYMPHSABS 1.7 04/25/2017 0938   MONOABS 0.9 04/26/2015 1053   EOSABS 0.0 04/25/2017 0938   BASOSABS 0.0 04/25/2017 0938    Hgb A1C Lab Results  Component Value Date   HGBA1C 5.1 04/25/2017            Assessment & Plan:   Sore Throat, Diarrhea, Chills:  RST: positive RX for Amoxicillin 500 mg TID x 10 days Ibuprofen 600 mg every 8 hours as needed for pain and inflammation Work note provided  Return precautions  discussed Nicki Reaperegina Nesbit Michon, NP

## 2018-10-09 NOTE — Addendum Note (Signed)
Addended by: Lorre MunroeBAITY, Tazia Illescas W on: 10/09/2018 02:17 PM   Modules accepted: Orders

## 2018-10-15 ENCOUNTER — Encounter: Payer: Self-pay | Admitting: Internal Medicine

## 2018-10-15 NOTE — Telephone Encounter (Signed)
No, if she is having that many migraines per month, she needs to be on preventative therapy or be seeing a neurologist.

## 2018-10-15 NOTE — Telephone Encounter (Signed)
Pt aware  Paperwork faxed

## 2018-10-15 NOTE — Telephone Encounter (Signed)
Spoke to pt she stated there was a conflict with part b question 7 duration.  I spoke with jimmy @ [facility] he stated to put 8 hours initial  and date. I was able to do this change with out your signature.   But the pt wanted to know if you could change the frequency from 2/3 days per month to 5 days per month to be on safe side. I told pt I would have to ask before changing this.   Paperwork in regina's in box

## 2018-10-16 NOTE — Telephone Encounter (Signed)
Pt aware of regina comment Copy for pt Copy for scan

## 2018-10-17 ENCOUNTER — Telehealth: Payer: Self-pay

## 2018-10-17 MED ORDER — FLUCONAZOLE 150 MG PO TABS: 150.0000 mg | ORAL_TABLET | Freq: Once | ORAL | 0 refills | Status: DC

## 2018-10-17 NOTE — Addendum Note (Signed)
Addended by: Lorre Munroe on: 10/17/2018 01:37 PM   Modules accepted: Orders

## 2018-10-17 NOTE — Telephone Encounter (Signed)
Patient calls in to report that she thinks she has developed a yeast infection from the Amoxicillin she is on for strep throat.  Please advise if a prescription can be sent in or if she will have to be seen first.

## 2018-10-17 NOTE — Telephone Encounter (Signed)
Diflucan sent to pharmacy.

## 2018-10-18 ENCOUNTER — Telehealth: Payer: Self-pay

## 2018-10-20 MED ORDER — FLUCONAZOLE 150 MG PO TABS: 150.0000 mg | ORAL_TABLET | Freq: Once | ORAL | 0 refills | Status: AC

## 2018-10-20 NOTE — Telephone Encounter (Signed)
Pt said she got the rx on Saturday evening. Says she is about 50% better. Does she need to come in or get another dose of fluconazle. Please advise pt. [facility].

## 2018-10-20 NOTE — Addendum Note (Signed)
Addended by: Lorre Munroe on: 10/20/2018 10:19 AM   Modules accepted: Orders

## 2018-10-20 NOTE — Telephone Encounter (Signed)
Patient Name: Danielle ListerAYLOR Sobecki Gender: Female DOB: 12-Nov-1995 Age: 23 Y 11 M 18 D Return Phone Number: [phone] (Primary), [phone] (Secondary) Address: City/State/Zip: [place] [address] Client [facility] - Client Client Site [facility] - Night Physician Nicki ReaperBaity, Regina - NP Contact Type Call Who Is Calling Patient / Member / Family / Caregiver Call Type Triage / Clinical Relationship To Patient Self Return Phone Number [phone] (Primary) Chief Complaint Vaginal Discharge Reason for Call Symptomatic / Request for Health Information Initial Comment Caller states has been taking abx for strep, she is getting yeast infection, asking if she can get something called in Translation No Nurse Assessment Nurse: Lane HackerHarley, RN, Elvin SoWindy Date/Time (Eastern Time): 10/18/2018 12:13:02 PM Confirm and document reason for call. If symptomatic, describe symptoms. ---Caller states has been taking amoxicillin for strep, seen by MD at office more than a week ago. She is having vaginal itching/ irritation, and white clumpy discharge. Feels she is getting yeast infection. She tried the Monistat 1 day last night, and no relief this am. Tried to have sex and very irritating. She is asking if she can get something called in Does the patient have any new or worsening symptoms? ---Yes Will a triage be completed? ---Yes Related visit to physician within the last 2 weeks? ---Yes Does the PT have any chronic conditions? (i.e. diabetes, asthma, this includes High risk factors for pregnancy, etc.) ---No Is the patient pregnant or possibly pregnant? (Ask all females between the ages of 1112-55) ---No Is this a behavioral health or substance abuse call? ---No Nurse: Lane HackerHarley, RN, Elvin SoWindy Date/Time (Eastern Time): 10/18/2018 12:17:46 PM Please select the assessment type ---Verbal order / New medication order Additional Documentation ---Request oral med for yeast  infection, see SMO. Does the client directives allow for assistance with medications after hours? ---Yes Other current medications? ---No Medication allergies? ---No Pharmacy name and phone number. [facility] [place] [facility] [address] [phone] PLEASE NOTE: All timestamps contained within this report are represented as [place] Standard Time. CONFIDENTIALTY NOTICE: This fax transmission is intended only for the addressee. It contains information that is legally privileged, confidential or otherwise protected from use or disclosure. If you are not the intended recipient, you are strictly prohibited from reviewing, disclosing, copying using or disseminating any of this information or taking any action in reliance on or regarding this information. If you have received this fax in error, please notify us immediately by telephone so that we can arrange for its return to us. Phone: [phone], Toll-Free: [phone], Fax: [phone] Page: 2 of 3 Call Id: [id number] Nurse Assessment Does the client directive allow for RN to call in the medication order to the pharmacy? ---Yes Additional Documentation ---[facility]. Caller aware to call office on Monday with update on status and to see if still needs f/ u OV. Guidelines Guideline Title Affirmed Question Affirmed Notes Nurse Date/Time (Eastern Time) Vaginal Discharge 4 or more episodes of vaginal infection in past year [place]ley, [place], Elvin SoWindy 10/18/2018 12:15:04 PM Disp. Time Lamount Cohen(Eastern Time) Disposition Final User 10/18/2018 11:50:21 AM Send To Nurse Lynita LombardAssign Morgan, RN, Todd 10/18/2018 12:24:09 PM Pharmacy Call Lane HackerHarley, RN, Elvin SoWindy Reason: [facility] with listed pharmacy. - Pt aware to check pharmacy in 60 - 90 min. to see if ready. 10/18/2018 12:17:37 PM SEE PCP WITHIN 3 DAYS Yes Lane HackerHarley, RN, Elvin SoWindy Caller Disagree/Comply Comply Caller Understands Yes PreDisposition Call Doctor Care  Advice Given  Per Guideline SEE PCP WITHIN 3 DAYS: * You need to be seen within 2 or 3 days. Call your doctor (or NP/PA) during regular office hours and make an appointment. A clinic or urgent care center are good places to go for care if your doctor's office is closed or you can't get an appointment. NOTE: If office will be open tomorrow, tell caller to call then, not in 3 days. GENITAL HYGIENE: * Keep your genital area clean. Wash daily. * Keep your genital area dry. Wear cotton underwear or underwear with a cotton crotch. * Do not douche * Do not use feminine hygiene products * Avoid prolonged wearing of pantyhose, tight jeans * Avoid bubble baths, deodorant tampons DO NOT: * DO NOT DOUCHE. Douching does not help vaginitis, and may actually make it worse. * DO NOT USE ANY VAGINAL CREAMS during the 24 hours before your physician appointment. Reason: interferes with examination. CALL BACK IF: * You become worse. CARE ADVICE given per Vaginal Discharge (Adult) guideline. Standing Orders Preparation Additional Instructions Route Frequency Duration Nurse Comments User Name Diflucan 150 mg 1 tablet Times 1 dose only Oral Lane HackerHarley, Charity fundraiserN, [place] Three[place]indy

## 2018-10-20 NOTE — Telephone Encounter (Signed)
Refilled diflucan. Can take Tuesday evening. If no improvement after that, should make an appt to be seen

## 2018-10-20 NOTE — Telephone Encounter (Signed)
Pt is aware as instructed 

## 2018-10-20 NOTE — Telephone Encounter (Signed)
This was addressed by on call and apparently PCP given refill today.

## 2018-10-22 NOTE — Telephone Encounter (Signed)
[facility] called in this morning [facility]x for fluconazole sent in on 10/20/18.  She states she did ask them to check for her name Danielle Morgan and not [name].  I called and spoke with pharmacist who confirmed that r/x was received and is ready to be picked.  I called [name] back and LM on her VM Cell (okay per DPR) that medication was received and has been ready for pick up and to please be sure she is giving and they are checking for Danielle Morgan and not [name].

## 2018-10-23 ENCOUNTER — Ambulatory Visit: Payer: Self-pay | Admitting: *Deleted

## 2018-10-23 VITALS — BP 109/80 | HR 71 | Ht 66.0 in | Wt 184.0 lb

## 2018-10-23 DIAGNOSIS — Z0000 Encounter for general adult medical examination without abnormal findings: Secondary | ICD-10-CM

## 2018-10-23 NOTE — Progress Notes (Signed)
[employer] insurance premium discount evaluation: Labs Drawn. Replacements ROI form signed. Tobacco Free Attestation form signed.  Forms placed in paper chart. Okay to route results to pcp per pt. 

## 2018-10-24 LAB — CMP12+LP+TP+TSH+6AC+CBC/D/PLT
ALK PHOS: 82 IU/L (ref 39–117)
ALT: 14 IU/L (ref 0–32)
AST: 20 IU/L (ref 0–40)
Albumin/Globulin Ratio: 1.3 (ref 1.2–2.2)
Albumin: 4.3 g/dL (ref 3.9–5.0)
BASOS ABS: 0 10*3/uL (ref 0.0–0.2)
BUN/Creatinine Ratio: 16 (ref 9–23)
BUN: 10 mg/dL (ref 6–20)
Basos: 0 %
Bilirubin Total: 0.5 mg/dL (ref 0.0–1.2)
CALCIUM: 9.2 mg/dL (ref 8.7–10.2)
CHOL/HDL RATIO: 3.2 ratio (ref 0.0–4.4)
CREATININE: 0.64 mg/dL (ref 0.57–1.00)
Chloride: 102 mmol/L (ref 96–106)
Cholesterol, Total: 167 mg/dL (ref 100–199)
EOS (ABSOLUTE): 0 10*3/uL (ref 0.0–0.4)
Eos: 0 %
Free Thyroxine Index: 1.5 (ref 1.2–4.9)
GFR calc Af Amer: 146 mL/min/{1.73_m2} (ref >59)
GFR calc non Af Amer: 127 mL/min/{1.73_m2} (ref >59)
GGT: 19 IU/L (ref 0–60)
Globulin, Total: 3.2 g/dL (ref 1.5–4.5)
Glucose: 77 mg/dL (ref 65–99)
HDL: 52 mg/dL (ref >39)
HEMATOCRIT: 40.1 % (ref 34.0–46.6)
Hemoglobin: 13.2 g/dL (ref 11.1–15.9)
IMMATURE GRANULOCYTES: 0 %
Immature Grans (Abs): 0 10*3/uL (ref 0.0–0.1)
Iron: 87 ug/dL (ref 27–159)
LDH: 193 IU/L (ref 119–226)
LDL CALC: 103 mg/dL — AB (ref 0–99)
LYMPHS ABS: 2.1 10*3/uL (ref 0.7–3.1)
LYMPHS: 23 %
MCH: 30 pg (ref 26.6–33.0)
MCHC: 32.9 g/dL (ref 31.5–35.7)
MCV: 91 fL (ref 79–97)
MONOCYTES: 7 %
Monocytes Absolute: 0.7 10*3/uL (ref 0.1–0.9)
Neutrophils Absolute: 6.3 10*3/uL (ref 1.4–7.0)
Neutrophils: 70 %
PHOSPHORUS: 3.5 mg/dL (ref 3.0–4.3)
Platelets: 284 10*3/uL (ref 150–450)
Potassium: 4.2 mmol/L (ref 3.5–5.2)
RBC: 4.4 x10E6/uL (ref 3.77–5.28)
RDW: 13.1 % (ref 11.7–15.4)
Sodium: 137 mmol/L (ref 134–144)
T3 Uptake Ratio: 24 % (ref 24–39)
T4 TOTAL: 6.2 ug/dL (ref 4.5–12.0)
TSH: 4.1 u[IU]/mL (ref 0.450–4.500)
Total Protein: 7.5 g/dL (ref 6.0–8.5)
Triglycerides: 59 mg/dL (ref 0–149)
URIC ACID: 4.6 mg/dL (ref 2.5–7.1)
VLDL Cholesterol Cal: 12 mg/dL (ref 5–40)
WBC: 9.2 10*3/uL (ref 3.4–10.8)

## 2018-10-24 LAB — HGB A1C W/O EAG: Hgb A1c MFr Bld: 5.1 % (ref 4.8–5.6)

## 2018-10-27 NOTE — Progress Notes (Signed)
Results reviewed with pt in clinic. LDL slightly elevated, similar to 2018. Diet and exercise recommendations discussed re: LDL, wt management, slightly elevated diastolic BP. Routine f/u with pcp. Copy of labs provided to pt. Results routed to pcp per pt request. No further questions/concerns.

## 2018-10-28 NOTE — Progress Notes (Signed)
noted 

## 2018-10-31 ENCOUNTER — Other Ambulatory Visit: Payer: Self-pay

## 2018-10-31 MED ORDER — CYCLOBENZAPRINE HCL 10 MG PO TABS: 10.0000 mg | ORAL_TABLET | Freq: Three times a day (TID) | ORAL | 0 refills | Status: DC | PRN

## 2018-10-31 NOTE — Telephone Encounter (Signed)
Pt request refill cyclobenzaprine to [facility] for [facility]. Last refilled # 30 on 06/27/18. Pt has one pill left and is presently having a migraine. Last seen 06/27/18.

## 2018-10-31 NOTE — Telephone Encounter (Signed)
Rx sent through [facility]  

## 2018-11-05 NOTE — Addendum Note (Signed)
Addended by: Roena Malady on: 11/05/2018 09:11 AM   Modules accepted: Orders

## 2018-11-27 ENCOUNTER — Other Ambulatory Visit: Payer: Self-pay

## 2018-11-27 ENCOUNTER — Ambulatory Visit (INDEPENDENT_AMBULATORY_CARE_PROVIDER_SITE_OTHER): Payer: PRIVATE HEALTH INSURANCE | Admitting: Internal Medicine

## 2018-11-27 NOTE — Progress Notes (Signed)
Subjective:    Patient ID: Danielle Morgan, female    DOB: 15-Dec-1995, 23 y.o.   MRN: [id number]  HPI  Pt presents to the clinic today for her annual exam.  Flu: Tetanus: 08/2015 Pap Smear: 05/2018 Dentist:  Diet: Exercise:  Review of Systems  Past Medical History:  Diagnosis Date  . Frequent headaches     Current Outpatient Medications  Medication Sig Dispense Refill  . cyclobenzaprine (FLEXERIL) 10 MG tablet Take 1 tablet (10 mg total) by mouth 3 (three) times daily as needed for muscle spasms. 30 tablet 0   No current facility-administered medications for this visit.     No Known Allergies  Family History  Problem Relation Age of Onset  . Arthritis Mother   . Bipolar disorder Father   . Cancer Paternal Grandmother        Lung  . ADD / ADHD Sister   . ADD / ADHD Brother   . Heart disease Maternal Uncle   . Arthritis Maternal Grandfather   . Hypertension Maternal Grandfather   . Alcohol abuse Maternal Grandfather     Social History   Socioeconomic History  . Marital status: Married    Spouse name: Not on file  . Number of children: Not on file  . Years of education: Not on file  . Highest education level: Not on file  Occupational History  . Not on file  Social Needs  . Financial resource strain: Not on file  . Food insecurity:    Worry: Not on file    Inability: Not on file  . Transportation needs:    Medical: Not on file    Non-medical: Not on file  Tobacco Use  . Smoking status: Never Smoker  . Smokeless tobacco: Never Used  Substance and Sexual Activity  . Alcohol use: No    Alcohol/week: 0.0 standard drinks  . Drug use: No  . Sexual activity: Yes    Partners: Male    Birth control/protection: I.U.D.  Lifestyle  . Physical activity:    Days per week: Not on file    Minutes per session: Not on file  . Stress: Not on file  Relationships  . Social connections:    Talks on phone: Not on file    Gets together: Not on file    Attends  religious service: Not on file    Active member of club or organization: Not on file    Attends meetings of clubs or organizations: Not on file    Relationship status: Not on file  . Intimate partner violence:    Fear of current or ex partner: Not on file    Emotionally abused: Not on file    Physically abused: Not on file    Forced sexual activity: Not on file  Other Topics Concern  . Not on file  Social History Narrative  . Not on file     Constitutional: Denies fever, malaise, fatigue, headache or abrupt weight changes.  HEENT: Denies eye pain, eye redness, ear pain, ringing in the ears, wax buildup, runny nose, nasal congestion, bloody nose, or sore throat. Respiratory: Denies difficulty breathing, shortness of breath, cough or sputum production.   Cardiovascular: Denies chest pain, chest tightness, palpitations or swelling in the hands or feet.  Gastrointestinal: Denies abdominal pain, bloating, constipation, diarrhea or blood in the stool.  GU: Denies urgency, frequency, pain with urination, burning sensation, blood in urine, odor or discharge. Musculoskeletal: Denies decrease in range of motion, difficulty with  gait, muscle pain or joint pain and swelling.  Skin: Denies redness, rashes, lesions or ulcercations.  Neurological: Denies dizziness, difficulty with memory, difficulty with speech or problems with balance and coordination.  Psych: Denies anxiety, depression, SI/HI.  No other specific complaints in a complete review of systems (except as listed in HPI above).     Objective:   Physical Exam        Assessment & Plan:

## 2019-04-01 ENCOUNTER — Telehealth: Payer: Self-pay | Admitting: Registered Nurse

## 2019-04-01 ENCOUNTER — Other Ambulatory Visit: Payer: Self-pay

## 2019-04-01 ENCOUNTER — Encounter: Payer: Self-pay | Admitting: Registered Nurse

## 2019-04-01 DIAGNOSIS — Z20822 Contact with and (suspected) exposure to covid-19: Secondary | ICD-10-CM

## 2019-04-01 DIAGNOSIS — Z20828 Contact with and (suspected) exposure to other viral communicable diseases: Secondary | ICD-10-CM

## 2019-04-01 NOTE — Telephone Encounter (Signed)
Patient unable to return to work without Covid testing as traveled to [place] late June and returned 6 Jul. Family members at vacation site and her son having runny nose, diarrhea, upset stomach after swimming in [place].  Unaware of any algae blooms, swimmer's itch or other waterborne diseases at [place] location.  Patient reported she wore mask while traveling interstate highways.  She contacted son's pediatrician and was told due to allergies and give son allergy medication but she is wondering if this is Covid symptoms due to updated symptom chart per CDC this week included runny nose, diarrhea, nausea, vomiting and congestion along with fever, cough, shortness of breath, fatigue, muscle aches, headache, sore throat, loss of taste/smell.  Discussed with patient I would order test for her.  Preferred location [place].  [facility] now no appt testing just drive up between [phone] patient notified.  HR Rep [employer] Hyacinth Meeker notified test ordered.  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  Returned to [facility] 6 Jul  Drove. My chart sign up text sent--encouraged patient to activate.  Patient notified if we are not notified she utilized Pharmacist, community to view results RN Hildred Alamin or I will call her with test results. Discussed with patient to quarantine at home until test results returned averaging 4-6 days at this time.  Continue to monitor for symptoms of covid 19 in herself. Patient verbalized understanding information/instructions, agreed with plan of care and had no further questions at this time.

## 2019-04-04 LAB — NOVEL CORONAVIRUS, NAA: SARS-CoV-2, NAA: NOT DETECTED

## 2019-04-06 NOTE — Telephone Encounter (Signed)
Called and spoke with pt over phone. Results reviewed. Denies sx. Cleared by HR to report back to work tomorrow. She verbalizes understanding and agreement. No further questions/concerns.

## 2019-04-20 ENCOUNTER — Ambulatory Visit: Payer: PRIVATE HEALTH INSURANCE | Admitting: Family Medicine

## 2019-04-21 ENCOUNTER — Other Ambulatory Visit: Payer: Self-pay

## 2019-04-21 ENCOUNTER — Encounter: Payer: Self-pay | Admitting: Obstetrics & Gynecology

## 2019-04-21 ENCOUNTER — Ambulatory Visit (INDEPENDENT_AMBULATORY_CARE_PROVIDER_SITE_OTHER): Payer: PRIVATE HEALTH INSURANCE | Admitting: Obstetrics & Gynecology

## 2019-04-21 VITALS — BP 136/91 | HR 69 | Wt 187.8 lb

## 2019-04-21 DIAGNOSIS — Z30432 Encounter for removal of intrauterine contraceptive device: Secondary | ICD-10-CM

## 2019-04-21 NOTE — Patient Instructions (Signed)
Preparing for Pregnancy If you are considering becoming pregnant, make an appointment to see your regular health care provider to learn how to prepare for a safe and healthy pregnancy (preconception care). During a preconception care visit, your health care provider will:  Do a complete physical exam, including a Pap test.  Take a complete medical history.  Give you information, answer your questions, and help you resolve problems. Preconception checklist Medical history  Tell your health care provider about any current or past medical conditions. Your pregnancy or your ability to become pregnant may be affected by chronic conditions, such as diabetes, chronic hypertension, and thyroid problems.  Include your family's medical history as well as your partner's medical history.  Tell your health care provider about any history of STIs (sexually transmitted infections).These can affect your pregnancy. In some cases, they can be passed to your baby. Discuss any concerns that you have about STIs.  If indicated, discuss the benefits of genetic testing. This testing will show whether there are any genetic conditions that may be passed from you or your partner to your baby.  Tell your health care provider about: ? Any problems you have had with conception or pregnancy. ? Any medicines you take. These include vitamins, herbal supplements, and over-the-counter medicines. ? Your history of immunizations. Discuss any vaccinations that you may need. Diet  Ask your health care provider what to include in a healthy diet that has a balance of nutrients. This is especially important when you are pregnant or preparing to become pregnant.  Ask your health care provider to help you reach a healthy weight before pregnancy. ? If you are overweight, you may be at higher risk for certain complications, such as high blood pressure, diabetes, and preterm birth. ? If you are underweight, you are more likely to  have a baby who has a low birth weight. Lifestyle, work, and home  Let your health care provider know: ? About any lifestyle habits that you have, such as alcohol use, drug use, or smoking. ? About recreational activities that may put you at risk during pregnancy, such as downhill skiing and certain exercise programs. ? Tell your health care provider about any international travel, especially any travel to places with an active Zika virus outbreak. ? About harmful substances that you may be exposed to at work or at home. These include chemicals, pesticides, radiation, or even litter boxes. ? If you do not feel safe at home. Mental health  Tell your health care provider about: ? Any history of mental health conditions, including feelings of depression, sadness, or anxiety. ? Any medicines that you take for a mental health condition. These include herbs and supplements. Home instructions to prepare for pregnancy Lifestyle   Eat a balanced diet. This includes fresh fruits and vegetables, whole grains, lean meats, low-fat dairy products, healthy fats, and foods that are high in fiber. Ask to meet with a nutritionist or registered dietitian for assistance with meal planning and goals.  Get regular exercise. Try to be active for at least 30 minutes a day on most days of the week. Ask your health care provider which activities are safe during pregnancy.  Do not use any products that contain nicotine or tobacco, such as cigarettes and e-cigarettes. If you need help quitting, ask your health care provider.  Do not drink alcohol.  Do not take illegal drugs.  Maintain a healthy weight. Ask your health care provider what weight range is right for you. General   instructions  Keep an accurate record of your menstrual periods. This makes it easier for your health care provider to determine your baby's due date.  Begin taking prenatal vitamins and folic acid supplements daily as directed by your  health care provider.  Manage any chronic conditions, such as high blood pressure and diabetes, as told by your health care provider. This is important. How do I know that I am pregnant? You may be pregnant if you have been sexually active and you miss your period. Symptoms of early pregnancy include:  Mild cramping.  Very light vaginal bleeding (spotting).  Feeling unusually tired.  Nausea and vomiting (morning sickness). If you have any of these symptoms and you suspect that you might be pregnant, you can take a home pregnancy test. These tests check for a hormone in your urine (human chorionic gonadotropin, or hCG). A woman's body begins to make this hormone during early pregnancy. These tests are very accurate. Wait until at least the first day after you miss your period to take one. If the test shows that you are pregnant (you get a positive result), call your health care provider to make an appointment for prenatal care. What should I do if I become pregnant?      Make an appointment with your health care provider as soon as you suspect you are pregnant.  Do not use any products that contain nicotine, such as cigarettes, chewing tobacco, and e-cigarettes. If you need help quitting, ask your health care provider.  Do not drink alcoholic beverages. Alcohol is related to a number of birth defects.  Avoid toxic odors and chemicals.  You may continue to have sexual intercourse if it does not cause pain or other problems, such as vaginal bleeding. This information is not intended to replace advice given to you by your health care provider. Make sure you discuss any questions you have with your health care provider. Document Released: 08/16/2008 Document Revised: 09/05/2017 Document Reviewed: 03/25/2016 Elsevier Patient Education  2020 Elsevier Inc.        

## 2019-04-21 NOTE — Progress Notes (Signed)
    GYNECOLOGY OFFICE PROCEDURE NOTE  Danielle Morgan is a 23 y.o. G2P1011 here for [place] IUD removal. No GYN concerns.  Last pap smear was on 05/26/2018 and was normal.  IUD Removal  Patient identified, informed consent performed, consent signed.  Patient was in the dorsal lithotomy position, normal external genitalia was noted.  A speculum was placed in the patient's vagina, normal discharge was noted, no lesions. The cervix was visualized, no lesions, no abnormal discharge.  The strings of the IUD were grasped and pulled using ring forceps. The IUD was removed in its entirety.  Patient tolerated the procedure well.    Patient plans for pregnancy soon and she was told to avoid teratogens, take PNV and folic acid.  Routine preventative health maintenance measures emphasized.   Verita Schneiders, MD, [facility] for [employer], [place]

## 2019-07-07 ENCOUNTER — Other Ambulatory Visit: Payer: Self-pay

## 2019-07-07 ENCOUNTER — Ambulatory Visit (INDEPENDENT_AMBULATORY_CARE_PROVIDER_SITE_OTHER): Payer: PRIVATE HEALTH INSURANCE | Admitting: Family Medicine

## 2019-07-07 ENCOUNTER — Encounter: Payer: Self-pay | Admitting: Family Medicine

## 2019-07-07 DIAGNOSIS — Z3A11 11 weeks gestation of pregnancy: Secondary | ICD-10-CM

## 2019-07-07 DIAGNOSIS — Z3481 Encounter for supervision of other normal pregnancy, first trimester: Secondary | ICD-10-CM | POA: Diagnosis not present

## 2019-07-07 DIAGNOSIS — Z3480 Encounter for supervision of other normal pregnancy, unspecified trimester: Secondary | ICD-10-CM

## 2019-07-07 DIAGNOSIS — Z23 Encounter for immunization: Secondary | ICD-10-CM

## 2019-07-07 DIAGNOSIS — Z113 Encounter for screening for infections with a predominantly sexual mode of transmission: Secondary | ICD-10-CM

## 2019-07-07 NOTE — Progress Notes (Signed)
DATING AND VIABILITY SONOGRAM   Danielle Morgan is a 23 y.o. year old G39P1011 with LMP Patient's last menstrual period was 04/20/2019 (approximate). which would correlate to  [redacted]w[redacted]d weeks gestation.  She has regular menstrual cycles.   She is here today for a confirmatory initial sonogram.    GESTATION: SINGLETON yes     FETAL ACTIVITY:          Heart rate    164          The fetus is active.    GESTATIONAL AGE AND  BIOMETRICS:  Gestational criteria: Estimated Date of Delivery: 01/25/20 by LMP now at [redacted]w[redacted]d  Previous Scans:0  GESTATIONAL SAC               CROWN RUMP LENGTH         4.13 mm         11.0 weeks                                                   AVERAGE EGA(BY THIS SCAN):   11.0 weeks  WORKING EDD( LMP ):  01/25/2020     TECHNICIAN COMMENTS:  Patient informed that the ultrasound is considered a limited obstetric ultrasound and is not intended to be a complete ultrasound exam. Patient also informed that the ultrasound is not being completed with the intent of assessing for fetal or placental anomalies or any pelvic abnormalities. Explained that the purpose of today's ultrasound is to assess for fetal heart rate. Patient acknowledges the purpose of the exam and the limitations of the study.     A copy of this report including all images has been saved and backed up to a second source for retrieval if needed. All measures and details of the anatomical scan, placentation, fluid volume and pelvic anatomy are contained in that report.  Crosby Oyster 07/07/2019 3:12 PM

## 2019-07-07 NOTE — Patient Instructions (Signed)

## 2019-07-07 NOTE — Progress Notes (Signed)
Subjective:   Katricia Prehn is a 23 y.o. G3P1011 at 101w1d by LMP, early ultrasound being seen today for her first obstetrical visit.  Her obstetrical history is not significant. Patient does intend to breast feed. Pregnancy history fully reviewed.  Patient reports nausea.  HISTORY: OB History  Gravida Para Term Preterm AB Living  3 1 1  0 1 1  SAB TAB Ectopic Multiple Live Births  1 0 0 0 1    # Outcome Date GA Lbr Len/2nd Weight Sex Delivery Anes PTL Lv  3 Current           2 Term 11/16/15 [redacted]w[redacted]d 11:31 / 01:31 7 lb 5.8 oz (3.34 kg) M Vag-Spont EPI  LIV     Name: SARRA, RACHELS     Apgar1: 8  Apgar5: 9  1 SAB            Last pap smear was  06/05/2018 and was normal Past Medical History:  Diagnosis Date  . Frequent headaches    Past Surgical History:  Procedure Laterality Date  . NO PAST SURGERIES     Family History  Problem Relation Age of Onset  . Arthritis Mother   . Bipolar disorder Father   . Cancer Paternal Grandmother        Lung  . ADD / ADHD Sister   . ADD / ADHD Brother   . Heart disease Maternal Uncle   . Arthritis Maternal Grandfather   . Hypertension Maternal Grandfather   . Alcohol abuse Maternal Grandfather    Social History   Tobacco Use  . Smoking status: Never Smoker  . Smokeless tobacco: Never Used  Substance Use Topics  . Alcohol use: No    Alcohol/week: 0.0 standard drinks  . Drug use: No   No Known Allergies Current Outpatient Medications on File Prior to Visit  Medication Sig Dispense Refill  . Prenatal Vit-Fe Fumarate-FA (MULTIVITAMIN-PRENATAL) 27-0.8 MG TABS tablet Take 1 tablet by mouth daily at 12 noon.     No current facility-administered medications on file prior to visit.      Exam   Vitals:   07/07/19 1458  BP: 128/83  Pulse: (!) 102  Weight: 175 lb (79.4 kg)   Fetal Heart Rate (bpm): 164  System: General: well-developed, well-nourished female in no acute distress   Skin: normal coloration and turgor, no  rashes   Neurologic: oriented, normal, negative, normal mood   Extremities: normal strength, tone, and muscle mass, ROM of all joints is normal   HEENT PERRLA, extraocular movement intact and sclera clear, anicteric   Mouth/Teeth mucous membranes moist, pharynx normal without lesions and dental hygiene good   Neck supple and no masses   Cardiovascular: regular rate and rhythm   Respiratory:  no respiratory distress, normal breath sounds   Abdomen: soft, non-tender; bowel sounds normal; no masses,  no organomegaly     Assessment:   Pregnancy: 07/09/19 Patient Active Problem List   Diagnosis Date Noted  . Supervision of other normal pregnancy, antepartum 04/26/2015    Priority: High  . Migraines 12/16/2017     Plan:  1. Supervision of other normal pregnancy, antepartum Considering genetics Will need carrier screen possibly - Obstetric Panel, Including HIV - Culture, OB Urine - GC/Chlamydia probe amp ([place])not at [facility] - Enroll Patient in Babyscripts - Babyscripts Schedule Optimization   Initial labs drawn. Continue prenatal vitamins. Genetic Screening discussed, NIPS: undecided. Ultrasound discussed; fetal anatomic survey: ordered. Problem list reviewed and updated.  The nature of [facility] with multiple MDs and other Advanced Practice Providers was explained to patient; also emphasized that residents, students are part of our team. Routine obstetric precautions reviewed. Return in 4 weeks (on 08/04/2019).

## 2019-07-08 LAB — OBSTETRIC PANEL, INCLUDING HIV
Antibody Screen: NEGATIVE
Basophils Absolute: 0 10*3/uL (ref 0.0–0.2)
Basos: 0 %
EOS (ABSOLUTE): 0.1 10*3/uL (ref 0.0–0.4)
Eos: 1 %
HIV Screen 4th Generation wRfx: NONREACTIVE
Hematocrit: 40.3 % (ref 34.0–46.6)
Hemoglobin: 13.3 g/dL (ref 11.1–15.9)
Hepatitis B Surface Ag: NEGATIVE
Immature Grans (Abs): 0 10*3/uL (ref 0.0–0.1)
Immature Granulocytes: 0 %
Lymphocytes Absolute: 1.8 10*3/uL (ref 0.7–3.1)
Lymphs: 21 %
MCH: 30.6 pg (ref 26.6–33.0)
MCHC: 33 g/dL (ref 31.5–35.7)
MCV: 93 fL (ref 79–97)
Monocytes Absolute: 0.7 10*3/uL (ref 0.1–0.9)
Monocytes: 8 %
Neutrophils Absolute: 6 10*3/uL (ref 1.4–7.0)
Neutrophils: 70 %
Platelets: 275 10*3/uL (ref 150–450)
RBC: 4.35 x10E6/uL (ref 3.77–5.28)
RDW: 12.1 % (ref 11.7–15.4)
RPR Ser Ql: NONREACTIVE
Rh Factor: POSITIVE
Rubella Antibodies, IGG: 1.85 index (ref >0.99)
WBC: 8.5 10*3/uL (ref 3.4–10.8)

## 2019-07-09 LAB — CULTURE, OB URINE

## 2019-07-09 LAB — URINE CULTURE, OB REFLEX

## 2019-07-10 LAB — GC/CHLAMYDIA PROBE AMP (~~LOC~~) NOT AT ARMC
Chlamydia: NEGATIVE
Comment: NEGATIVE
Comment: NORMAL
Neisseria Gonorrhea: NEGATIVE

## 2019-08-03 ENCOUNTER — Telehealth: Payer: Self-pay | Admitting: Internal Medicine

## 2019-08-03 NOTE — Telephone Encounter (Signed)
Done, placed in my outbox.

## 2019-08-03 NOTE — Telephone Encounter (Signed)
Pt dropped off certification of health care provider form to be filled out In Danielle Morgan's in box for review and signature

## 2019-08-04 ENCOUNTER — Telehealth (INDEPENDENT_AMBULATORY_CARE_PROVIDER_SITE_OTHER): Payer: PRIVATE HEALTH INSURANCE | Admitting: Obstetrics & Gynecology

## 2019-08-04 ENCOUNTER — Other Ambulatory Visit: Payer: Self-pay

## 2019-08-04 DIAGNOSIS — Z3480 Encounter for supervision of other normal pregnancy, unspecified trimester: Secondary | ICD-10-CM

## 2019-08-04 DIAGNOSIS — Z3482 Encounter for supervision of other normal pregnancy, second trimester: Secondary | ICD-10-CM

## 2019-08-04 DIAGNOSIS — G43C0 Periodic headache syndromes in child or adult, not intractable: Secondary | ICD-10-CM

## 2019-08-04 DIAGNOSIS — Z3A15 15 weeks gestation of pregnancy: Secondary | ICD-10-CM

## 2019-08-04 NOTE — Telephone Encounter (Signed)
Paperwork faxed ° °Pt aware °Copy for pt °Copy for scan °

## 2019-08-04 NOTE — Progress Notes (Signed)
I connected with  Sherron Monday on 08/04/19 at  4:00 PM EST by telephone and verified that I am speaking with the correct person using two identifiers.   I discussed the limitations, risks, security and privacy concerns of performing an evaluation and management service by telephone and the availability of in person appointments. I also discussed with the patient that there may be a patient responsible charge related to this service. The patient expressed understanding and agreed to proceed.  Youcef Klas Jeanella Anton, [place] 08/04/2019  4:03 PM

## 2019-08-04 NOTE — Progress Notes (Signed)
   TELEHEALTH OBSTETRICS PRENATAL VIRTUAL VIDEO VISIT ENCOUNTER NOTE  Provider location: [facility]   I connected with Danielle Morgan on 08/04/19 at  4:00 PM EST by MyChart Video Encounter at home and verified that I am speaking with the correct person using two identifiers.   I discussed the limitations, risks, security and privacy concerns of performing an evaluation and management service virtually and the availability of in person appointments. I also discussed with the patient that there may be a patient responsible charge related to this service. The patient expressed understanding and agreed to proceed. Subjective:  Danielle Morgan is a 23 y.o. G3P1011 at [redacted]w[redacted]d being seen today for ongoing prenatal care.  She is currently monitored for the following issues for this high-risk pregnancy and has Supervision of other normal pregnancy, antepartum and Migraines on their problem list.  Patient reports migraines.  Contractions: Not present.  .  Movement: Absent. Denies any leaking of fluid.   The following portions of the patient's history were reviewed and updated as appropriate: allergies, current medications, past family history, past medical history, past social history, past surgical history and problem list.   Objective:  There were no vitals filed for this visit.  Fetal Status:     Movement: Absent     General:  Alert, oriented and cooperative. Patient is in no acute distress.  Respiratory: Normal respiratory effort, no problems with respiration noted  Mental Status: Normal mood and affect. Normal behavior. Normal judgment and thought content.  Rest of physical exam deferred due to type of encounter  Imaging: No results found.  Assessment and Plan:  Pregnancy: G3P1011 at [redacted]w[redacted]d 1. Supervision of other normal pregnancy, antepartum  - [place] MFM OB COMP + 14 WK; Future - Hemoglobin A1c; Future  2. Periodic headache syndrome, not intractable - rec  appt with Allie Dimmer  Preterm labor symptoms and general obstetric precautions including but not limited to vaginal bleeding, contractions, leaking of fluid and fetal movement were reviewed in detail with the patient. I discussed the assessment and treatment plan with the patient. The patient was provided an opportunity to ask questions and all were answered. The patient agreed with the plan and demonstrated an understanding of the instructions. The patient was advised to call back or seek an in-person office evaluation/go to [facility] at [facility] for any urgent or concerning symptoms. Please refer to After Visit Summary for other counseling recommendations.   I provided 10 minutes of face-to-face time during this encounter.  Return in about 4 weeks (around 09/01/2019) for in person.  No future appointments.  Emily Filbert, MD [facility] for [employer], [place]

## 2019-08-05 ENCOUNTER — Telehealth: Payer: Self-pay | Admitting: Radiology

## 2019-08-05 NOTE — Telephone Encounter (Signed)
Left message for patient to [facility] for appointment information.

## 2019-08-10 ENCOUNTER — Ambulatory Visit: Payer: Self-pay | Admitting: *Deleted

## 2019-08-10 ENCOUNTER — Other Ambulatory Visit: Payer: Self-pay

## 2019-08-10 VITALS — BP 106/73 | HR 58

## 2019-08-10 DIAGNOSIS — Z3480 Encounter for supervision of other normal pregnancy, unspecified trimester: Secondary | ICD-10-CM

## 2019-08-10 DIAGNOSIS — Z0130 Encounter for examination of blood pressure without abnormal findings: Secondary | ICD-10-CM

## 2019-08-10 NOTE — Progress Notes (Signed)
Pt requests BP check. Sts she is pregnant and OB has requested she get BP cuff for home monitoring. No current HTN. Virtual OB visits. Pt denies LE edema, HA, vision changes, etc. Has not gotten home BP cuff yet. BP taken after pt sitting for 2-3 minutes. Today's reading routed to [facility] MD [place].

## 2019-08-21 ENCOUNTER — Ambulatory Visit (INDEPENDENT_AMBULATORY_CARE_PROVIDER_SITE_OTHER): Payer: PRIVATE HEALTH INSURANCE | Admitting: Physician Assistant

## 2019-08-21 ENCOUNTER — Encounter: Payer: Self-pay | Admitting: Physician Assistant

## 2019-08-21 ENCOUNTER — Other Ambulatory Visit: Payer: Self-pay

## 2019-08-21 VITALS — BP 118/76 | HR 92 | Wt 182.0 lb

## 2019-08-21 DIAGNOSIS — Z3480 Encounter for supervision of other normal pregnancy, unspecified trimester: Secondary | ICD-10-CM

## 2019-08-21 DIAGNOSIS — O26892 Other specified pregnancy related conditions, second trimester: Secondary | ICD-10-CM

## 2019-08-21 DIAGNOSIS — Z3A17 17 weeks gestation of pregnancy: Secondary | ICD-10-CM

## 2019-08-21 DIAGNOSIS — R519 Headache, unspecified: Secondary | ICD-10-CM

## 2019-08-21 DIAGNOSIS — G43009 Migraine without aura, not intractable, without status migrainosus: Secondary | ICD-10-CM

## 2019-08-21 LAB — HEMOGLOBIN A1C
Est. average glucose Bld gHb Est-mCnc: 97 mg/dL
Hgb A1c MFr Bld: 5 % (ref 4.8–5.6)

## 2019-08-21 MED ORDER — BUTALBITAL-APAP-CAFFEINE 50-325-40 MG PO CAPS: 1.0000 | ORAL_CAPSULE | Freq: Four times a day (QID) | ORAL | 1 refills | Status: DC | PRN

## 2019-08-21 MED ORDER — CYCLOBENZAPRINE HCL 10 MG PO TABS: 10.0000 mg | ORAL_TABLET | Freq: Three times a day (TID) | ORAL | 3 refills | Status: DC | PRN

## 2019-08-21 MED ORDER — PROMETHAZINE HCL 12.5 MG PO TABS: 12.5000 mg | ORAL_TABLET | Freq: Four times a day (QID) | ORAL | 0 refills | Status: DC | PRN

## 2019-08-21 NOTE — Progress Notes (Signed)
History:  Danielle Morgan is a 23 y.o. G3P1011 who presents to clinic today for headache.  She had an increase in headaches with her first pregnancy that was worse than what she is currently experiencing.  They were worse during the 2nd trimester last pregnancy.     She first developed migraines with menarche.  The migraines are severe, lasting 6 hours to 3 days.  The pain is located behind the left eye, throbbing, worse with movement.  There is nausea vomiting photophobia phonophobia.    She has used cyclobenzaprine and tylenol.  Tylenol is not helpful.   HIT6:72 Number of days in the last 4 weeks with:  Severe headache: 2 Moderate headache: 6 Mild headache: 0  No headache: 20   Past Medical History:  Diagnosis Date  . Frequent headaches     Social History   Socioeconomic History  . Marital status: Married    Spouse name: Not on file  . Number of children: Not on file  . Years of education: Not on file  . Highest education level: Not on file  Occupational History  . Not on file  Social Needs  . Financial resource strain: Not on file  . Food insecurity    Worry: Not on file    Inability: Not on file  . Transportation needs    Medical: Not on file    Non-medical: Not on file  Tobacco Use  . Smoking status: Never Smoker  . Smokeless tobacco: Never Used  Substance and Sexual Activity  . Alcohol use: No    Alcohol/week: 0.0 standard drinks  . Drug use: No  . Sexual activity: Yes    Partners: Male    Birth control/protection: None  Lifestyle  . Physical activity    Days per week: Not on file    Minutes per session: Not on file  . Stress: Not on file  Relationships  . Social Musician on phone: Not on file    Gets together: Not on file    Attends religious service: Not on file    Active member of club or organization: Not on file    Attends meetings of clubs or organizations: Not on file    Relationship status: Not on file  . Intimate partner violence   Fear of current or ex partner: Not on file    Emotionally abused: Not on file    Physically abused: Not on file    Forced sexual activity: Not on file  Other Topics Concern  . Not on file  Social History Narrative  . Not on file    Family History  Problem Relation Age of Onset  . Arthritis Mother   . Bipolar disorder Father   . Cancer Paternal Grandmother        Lung  . ADD / ADHD Sister   . ADD / ADHD Brother   . Heart disease Maternal Uncle   . Arthritis Maternal Grandfather   . Hypertension Maternal Grandfather   . Alcohol abuse Maternal Grandfather     No Known Allergies  Current Outpatient Medications on File Prior to Visit  Medication Sig Dispense Refill  . Prenatal Vit-Fe Fumarate-FA (MULTIVITAMIN-PRENATAL) 27-0.8 MG TABS tablet Take 1 tablet by mouth daily at 12 noon.     No current facility-administered medications on file prior to visit.      Review of Systems:  All pertinent positive/negative included in HPI, all other review of systems are negative   Objective:  Physical Exam BP 118/76   Pulse 92   Wt 182 lb (82.6 kg)   LMP 04/20/2019 (Approximate)   BMI 29.38 kg/m  CONSTITUTIONAL: Well-developed, well-nourished female in no acute distress.  EYES: EOM intact ENT: Normocephalic CARDIOVASCULAR: Regular rate RESPIRATORY: Normal rate.   MUSCULOSKELETAL: Normal ROM SKIN: Warm, dry without erythema  NEUROLOGICAL: Alert, oriented, CN II-XII grossly intact, Appropriate balance   PSYCH: Normal behavior, mood   Assessment & Plan:  Assessment: Migraine without aura Headache in pregnancy - new diagnosis  Plan: Flexeril - prn - use first line - sedation precautions Fioricet - if flexeril not sufficient - limit to 2 days per week Phenergan - Migraine rescue - sedation precautions Lifestyle modifications Follow-up PRN  Paticia Stack, PA-C 08/21/2019 11:27 AM

## 2019-08-21 NOTE — Patient Instructions (Signed)

## 2019-08-25 ENCOUNTER — Other Ambulatory Visit: Payer: Self-pay

## 2019-08-25 ENCOUNTER — Ambulatory Visit: Payer: Self-pay | Admitting: *Deleted

## 2019-08-25 VITALS — BP 110/71 | HR 59

## 2019-08-25 DIAGNOSIS — Z0130 Encounter for examination of blood pressure without abnormal findings: Secondary | ICD-10-CM

## 2019-08-25 DIAGNOSIS — Z3480 Encounter for supervision of other normal pregnancy, unspecified trimester: Secondary | ICD-10-CM

## 2019-08-25 NOTE — Progress Notes (Signed)
Weekly BP check per OB request.

## 2019-09-01 ENCOUNTER — Other Ambulatory Visit (HOSPITAL_COMMUNITY): Payer: PRIVATE HEALTH INSURANCE

## 2019-09-03 ENCOUNTER — Other Ambulatory Visit: Payer: Self-pay

## 2019-09-03 ENCOUNTER — Ambulatory Visit (INDEPENDENT_AMBULATORY_CARE_PROVIDER_SITE_OTHER): Payer: PRIVATE HEALTH INSURANCE | Admitting: Obstetrics and Gynecology

## 2019-09-03 VITALS — BP 127/81 | HR 92 | Wt 181.1 lb

## 2019-09-03 DIAGNOSIS — Z3482 Encounter for supervision of other normal pregnancy, second trimester: Secondary | ICD-10-CM

## 2019-09-03 DIAGNOSIS — Z3480 Encounter for supervision of other normal pregnancy, unspecified trimester: Secondary | ICD-10-CM

## 2019-09-03 DIAGNOSIS — Z3A19 19 weeks gestation of pregnancy: Secondary | ICD-10-CM

## 2019-09-03 MED ORDER — MAGNESIUM MALATE 1250 (141.7 MG) MG PO TABS: 1.0000 | ORAL_TABLET | Freq: Every day | ORAL | Status: DC | PRN

## 2019-09-04 ENCOUNTER — Other Ambulatory Visit (HOSPITAL_COMMUNITY): Payer: Self-pay | Admitting: *Deleted

## 2019-09-04 ENCOUNTER — Ambulatory Visit (HOSPITAL_COMMUNITY)
Admission: RE | Admit: 2019-09-04 | Discharge: 2019-09-04 | Disposition: A | Discharge status: Home / Self Care | Payer: PRIVATE HEALTH INSURANCE | Source: Ambulatory Visit | Attending: Obstetrics and Gynecology | Admitting: Obstetrics and Gynecology

## 2019-09-04 DIAGNOSIS — Z363 Encounter for antenatal screening for malformations: Secondary | ICD-10-CM

## 2019-09-04 DIAGNOSIS — Z3A19 19 weeks gestation of pregnancy: Secondary | ICD-10-CM

## 2019-09-04 DIAGNOSIS — Z3480 Encounter for supervision of other normal pregnancy, unspecified trimester: Secondary | ICD-10-CM | POA: Insufficient documentation

## 2019-09-06 NOTE — Progress Notes (Signed)
Prenatal Visit Note Date: 09/03/2019 Clinic: [facility]  Subjective:  Danielle Morgan is a 23 y.o. G3P1011 at [redacted]w[redacted]d being seen today for ongoing prenatal care.  She is currently monitored for the following issues for this low-risk pregnancy and has Supervision of other normal pregnancy, antepartum and Migraines on their problem list.  Patient reports no complaints.   Contractions: Not present. Vag. Bleeding: None.  Movement: Present. Denies leaking of fluid.   The following portions of the patient's history were reviewed and updated as appropriate: allergies, current medications, past family history, past medical history, past social history, past surgical history and problem list. Problem list updated.  Objective:   Vitals:   09/03/19 0911  BP: 127/81  Pulse: 92  Weight: 181 lb 1.6 oz (82.1 kg)    Fetal Status: Fetal Heart Rate (bpm): 145   Movement: Present     General:  Alert, oriented and cooperative. Patient is in no acute distress.  Skin: Skin is warm and dry. No rash noted.   Cardiovascular: Normal heart rate noted  Respiratory: Normal respiratory effort, no problems with respiration noted  Abdomen: Soft, gravid, appropriate for gestational age. Pain/Pressure: Absent     Pelvic:  Cervical exam deferred        Extremities: Normal range of motion.     Mental Status: Normal mood and affect. Normal behavior. Normal judgment and thought content.   Urinalysis:      Assessment and Plan:  Pregnancy: G3P1011 at [redacted]w[redacted]d  1. Supervision of other normal pregnancy, antepartum Anatomy u/s tomorrow. Declines afp   Preterm labor symptoms and general obstetric precautions including but not limited to vaginal bleeding, contractions, leaking of fluid and fetal movement were reviewed in detail with the patient. Please refer to After Visit Summary for other counseling recommendations.  Return in about 1 month (around 10/04/2019) for low risk, in person, virtual  visit.   Aletha Halim, MD

## 2019-09-18 NOTE — L&D Delivery Note (Addendum)
OB/GYN Faculty Practice Delivery Note  Danielle Morgan is a 24 y.o. G3P1011 s/p SVD at [redacted]w[redacted]d. She was admitted for IOL for gHTN.  ROM: 3h 72m with clear fluid GBS Status: GBS Negative/-- (04/14 1614) Maximum Maternal Temperature: 98.62F  Labor Progress: . Initial SVE: S/p foley bulb placed in office which came out. At 4 cm on admission. Received epidural. Pitocin started at 1836. AROM at 0110 hrs with clear fluid. She then progressed to complete.   Delivery Date/Time: 01/14/20 @ 0416hrs Delivery: Called to room and patient was complete and pushing. Head delivered LOA. Nuchal cord x 2 present, delivered through then both easily reducible. Shoulder and body delivered in usual fashion. Infant with spontaneous cry, placed on mother's abdomen, dried and stimulated. Cord clamped x 2 after 1-minute delay, and cut by FOB. Cord blood drawn. Placenta delivered spontaneously with gentle cord traction. Fundus firm with massage and Pitocin. Labia, perineum, vagina, and cervix inspected inspected with bilateral tears, R labial tear sutured with 4.0 vicryl in the usual fashion. L labial tear achieved homeostasis without sutures.  Baby Weight: 3530g  Placenta: Sent to L&D Complications: None Lacerations: Bilateral labial EBL: 175 mL Analgesia: Epidural   Infant:  APGAR (1 MIN):  7 APGAR (5 MINS):  8   Dorothe Pea, DO, PGY1 01/14/2020, 4:38 AM  OB FELLOW DELIVERY ATTESTATION  I was gloved and present for the delivery in its entirety, and I agree with the above resident's note.    Jerilynn Birkenhead, MD [facility] Family Medicine Fellow, [facility] for [employer], [facility]

## 2019-09-24 ENCOUNTER — Ambulatory Visit: Payer: Self-pay | Admitting: *Deleted

## 2019-09-24 ENCOUNTER — Other Ambulatory Visit: Payer: Self-pay

## 2019-09-24 VITALS — BP 105/70 | HR 65

## 2019-09-24 DIAGNOSIS — Z0130 Encounter for examination of blood pressure without abnormal findings: Secondary | ICD-10-CM

## 2019-09-24 DIAGNOSIS — Z3480 Encounter for supervision of other normal pregnancy, unspecified trimester: Secondary | ICD-10-CM

## 2019-09-24 NOTE — Progress Notes (Signed)
Routine weekly BP check for ob/gyn.   Returning in one week for check prior to virtual OB visit.

## 2019-09-29 ENCOUNTER — Ambulatory Visit (INDEPENDENT_AMBULATORY_CARE_PROVIDER_SITE_OTHER): Payer: PRIVATE HEALTH INSURANCE | Admitting: *Deleted

## 2019-09-29 ENCOUNTER — Other Ambulatory Visit: Payer: Self-pay

## 2019-09-29 ENCOUNTER — Other Ambulatory Visit (HOSPITAL_COMMUNITY)
Admission: RE | Admit: 2019-09-29 | Discharge: 2019-09-29 | Disposition: A | Discharge status: Home / Self Care | Payer: PRIVATE HEALTH INSURANCE | Source: Ambulatory Visit | Attending: Obstetrics and Gynecology | Admitting: Obstetrics and Gynecology

## 2019-09-29 VITALS — BP 115/76 | HR 66

## 2019-09-29 DIAGNOSIS — N898 Other specified noninflammatory disorders of vagina: Secondary | ICD-10-CM | POA: Insufficient documentation

## 2019-09-29 NOTE — Progress Notes (Signed)
SUBJECTIVE:  24 y.o. female complains of white vaginal discharge for 3 day(s). Denies abnormal vaginal bleeding or significant pelvic pain or fever. No UTI symptoms. Denies history of known exposure to STD.  Patient's last menstrual period was 04/20/2019 (approximate).  OBJECTIVE:  She appears well, afebrile. Urine dipstick: not done.  ASSESSMENT:  Vaginal Discharge     PLAN:   BVAG, CVAG probe sent to lab. Treatment: To be determined once lab results are received ROV prn if symptoms persist or worsen.

## 2019-09-29 NOTE — Progress Notes (Signed)
I have reviewed this chart and agree with the RN/CMA assessment and management.    K. Meryl Subrina Vecchiarelli, M.D. Attending [facility]   

## 2019-09-30 LAB — CERVICOVAGINAL ANCILLARY ONLY
Bacterial Vaginitis (gardnerella): NEGATIVE
Candida Glabrata: NEGATIVE
Candida Vaginitis: POSITIVE — AB
Comment: NEGATIVE
Comment: NEGATIVE
Comment: NEGATIVE

## 2019-09-30 MED ORDER — TERCONAZOLE 0.4 % VA CREA: 1.0000 | TOPICAL_CREAM | Freq: Every day | VAGINAL | 0 refills | Status: DC

## 2019-09-30 NOTE — Addendum Note (Signed)
Addended by: Leroy Libman on: 09/30/2019 11:46 AM   Modules accepted: Orders

## 2019-10-01 ENCOUNTER — Telehealth (INDEPENDENT_AMBULATORY_CARE_PROVIDER_SITE_OTHER): Payer: PRIVATE HEALTH INSURANCE | Admitting: Obstetrics & Gynecology

## 2019-10-01 ENCOUNTER — Ambulatory Visit: Payer: Self-pay | Admitting: *Deleted

## 2019-10-01 ENCOUNTER — Other Ambulatory Visit: Payer: Self-pay

## 2019-10-01 VITALS — BP 118/76 | HR 69 | Wt 184.0 lb

## 2019-10-01 VITALS — BP 118/76 | HR 69 | Temp 98.1°F | Wt 184.0 lb

## 2019-10-01 DIAGNOSIS — Z3A23 23 weeks gestation of pregnancy: Secondary | ICD-10-CM

## 2019-10-01 DIAGNOSIS — Z0130 Encounter for examination of blood pressure without abnormal findings: Secondary | ICD-10-CM

## 2019-10-01 DIAGNOSIS — Z3482 Encounter for supervision of other normal pregnancy, second trimester: Secondary | ICD-10-CM

## 2019-10-01 DIAGNOSIS — Z3480 Encounter for supervision of other normal pregnancy, unspecified trimester: Secondary | ICD-10-CM

## 2019-10-01 NOTE — Patient Instructions (Signed)

## 2019-10-01 NOTE — Progress Notes (Signed)
TELEHEALTH OBSTETRICS PRENATAL VIRTUAL VIDEO VISIT ENCOUNTER NOTE  Provider location: [facility]   I connected with Danielle Morgan on 10/01/19 at  3:00 PM EST by MyChart Video Encounter at home and verified that I am speaking with the correct person using two identifiers.   I discussed the limitations, risks, security and privacy concerns of performing an evaluation and management service virtually and the availability of in person appointments. I also discussed with the patient that there may be a patient responsible charge related to this service. The patient expressed understanding and agreed to proceed. Subjective:  Danielle Morgan is a 24 y.o. G3P1011 at [redacted]w[redacted]d being seen today for ongoing prenatal care.  She is currently monitored for the following issues for this low-risk pregnancy and has Supervision of other normal pregnancy, antepartum and Migraines on their problem list.  Patient reports no complaints.  Contractions: Not present. Vag. Bleeding: None.  Movement: Present. Denies any leaking of fluid.   The following portions of the patient's history were reviewed and updated as appropriate: allergies, current medications, past family history, past medical history, past social history, past surgical history and problem list.   Objective:   Vitals:   10/01/19 1459  BP: 118/76  Pulse: 69  Weight: 184 lb (83.5 kg)    Fetal Status:     Movement: Present     General:  Alert, oriented and cooperative. Patient is in no acute distress.  Respiratory: Normal respiratory effort, no problems with respiration noted  Mental Status: Normal mood and affect. Normal behavior. Normal judgment and thought content.  Rest of physical exam deferred due to type of encounter  Imaging: [place] MFM OB COMP + 14 WK  Result Date: 09/04/2019 ----------------------------------------------------------------------  OBSTETRICS REPORT                       (Signed Final  09/04/2019 03:26 pm) ---------------------------------------------------------------------- Patient Info  ID #:       [id number]                          D.O.B.:  1996-01-19 (23 yrs)  Name:       Danielle Morgan                   Visit Date: 09/04/2019 02:28 pm ---------------------------------------------------------------------- Performed By  Performed By:     Percell Boston          Ref. Address:     [address]                    [address]                                                             [address]  Attending:        Noralee Space MD        [facility]  Referred By:      [facility] ---------------------------------------------------------------------- Orders   #  Description                          Code         Ordered By   1  [place] MFM OB COMP + 14 WK               [id number]     MYRA DOVE  ----------------------------------------------------------------------   #  Order #                    Accession #                 Episode #   1  [id number]                  [id number]                  [id number]  ---------------------------------------------------------------------- Indications   [redacted] weeks gestation of pregnancy                Z3A.19   Encounter for antenatal screening for          Z36.3   malformations (no testing)  ---------------------------------------------------------------------- Fetal Evaluation  Num Of Fetuses:         1  Fetal Heart Rate(bpm):  143  Cardiac Activity:       Observed  Presentation:           Breech  Placenta:               Posterior  P. Cord Insertion:      Visualized, central  Amniotic Fluid  AFI FV:      Within normal limits                              Largest Pocket(cm)                              4.54 ---------------------------------------------------------------------- Biometry  BPD:      47.5  mm     G. Age:  20w 3d         81  %    CI:        76.39   %    70 - 86                                                           FL/HC:      16.4   %    16.8 - 19.8  HC:      172.2  mm     G. Age:  19w 6d         53  %    HC/AC:      1.14        1.09 - 1.39  AC:       151   mm     G. Age:  20w 2d         70  %    FL/BPD:     59.4   %  FL:  28.2  mm     G. Age:  18w 4d         14  %    FL/AC:      18.7   %    20 - 24  HUM:      27.4  mm     G. Age:  18w 5d         30  %  CER:      20.9  mm     G. Age:  19w 6d         56  %  NFT:       3.6  mm  CM:        4.6  mm  Est. FW:     302  gm    0 lb 11 oz      47  % ---------------------------------------------------------------------- OB History  Gravidity:    3         Term:   1        Prem:   0        SAB:   1  TOP:          0       Ectopic:  0        Living: 1 ---------------------------------------------------------------------- Gestational Age  LMP:           19w 4d        Date:  04/20/19                 EDD:   01/25/20  U/S Today:     19w 6d                                        EDD:   01/23/20  Best:          19w 4d     Det. By:  LMP  (04/20/19)          EDD:   01/25/20 ---------------------------------------------------------------------- Anatomy  Cranium:               Appears normal         Aortic Arch:            Appears normal  Cavum:                 Appears normal         Ductal Arch:            Not well visualized  Ventricles:            Appears normal         Diaphragm:              Appears normal  Choroid Plexus:        Appears normal         Stomach:                Appears normal, left                                                                        sided  Cerebellum:  Appears normal         Abdomen:                Appears normal  Posterior Fossa:       Appears normal         Abdominal Wall:         Appears nml (cord                                                                        insert, abd wall)  Nuchal Fold:           Appears normal         Cord Vessels:           Appears normal (3                                                                         vessel cord)  Face:                  Appears normal         Kidneys:                Appear normal                         (orbits and profile)  Lips:                  Appears normal         Bladder:                Appears normal  Thoracic:              Appears normal         Spine:                  Appears normal  Heart:                 Not well visualized    Upper Extremities:      Appears normal  RVOT:                  Not well visualized    Lower Extremities:      Appears normal  LVOT:                  Not well visualized  Other:  Fetus appears to be a female. Heels and 5th digit visualized. Nasal          bone visualized. Technically difficult due to fetal position. ---------------------------------------------------------------------- Cervix Uterus Adnexa  Cervix  Length:            4.7  cm.  Normal appearance by transabdominal scan.  Uterus  No abnormality visualized.  Left Ovary  No adnexal mass visualized.  Right Ovary  No adnexal mass visualized.  Cul De Sac  No free fluid seen.  Adnexa  No abnormality visualized. ---------------------------------------------------------------------- Impression  We performed fetal anatomy scan. No makers of  aneuploidies or fetal structural defects are seen. Fetal  biometry is consistent with her previously-established dates.  Amniotic fluid is normal and good fetal activity is seen.  Patient understands the limitations of ultrasound in detecting  fetal anomalies.  Patient had opted not to screen for fetal aneuploidies. ---------------------------------------------------------------------- Recommendations  -An appointment was made for her to return in 4 weeks for  completion of fetal anatomy. ----------------------------------------------------------------------                  Noralee Space, MD Electronically Signed Final Report   09/04/2019 03:26 pm ----------------------------------------------------------------------   Assessment  and Plan:  Pregnancy: G3P1011 at [redacted]w[redacted]d Supervision of other normal pregnancy, antepartum Third trimester labs next time.  Preterm labor symptoms and general obstetric precautions including but not limited to vaginal bleeding, contractions, leaking of fluid and fetal movement were reviewed in detail with the patient. I discussed the assessment and treatment plan with the patient. The patient was provided an opportunity to ask questions and all were answered. The patient agreed with the plan and demonstrated an understanding of the instructions. The patient was advised to call back or seek an in-person office evaluation/go to MAU at [facility] for any urgent or concerning symptoms. Please refer to After Visit Summary for other counseling recommendations.   I provided 10 minutes of face-to-face time during this encounter.  Return in about 4 weeks (around 10/29/2019) for 2 hr GTT, 3rd trimester labs, OFFICE OB Visit, TDap.  Future Appointments  Date Time Provider Department Center  10/02/2019 12:45 PM [facility] [place] 2 [facility]  10/28/2019 10:00 AM Federico Flake, MD [facility]    Jaynie Collins, MD Center for [facility], [facility]

## 2019-10-01 NOTE — Progress Notes (Signed)
I connected with  Danielle Morgan on 10/01/19 at  3:00 PM EST by telephone and verified that I am speaking with the correct person using two identifiers.   I discussed the limitations, risks, security and privacy concerns of performing an evaluation and management service by telephone and the availability of in person appointments. I also discussed with the patient that there may be a patient responsible charge related to this service. The patient expressed understanding and agreed to proceed.  Scheryl Marten, RN 10/01/2019  2:59 PM

## 2019-10-01 NOTE — Progress Notes (Signed)
Full VS and Wt check in advance of virtual OB visit this afternoon.

## 2019-10-02 ENCOUNTER — Ambulatory Visit (HOSPITAL_COMMUNITY)
Admission: RE | Admit: 2019-10-02 | Discharge: 2019-10-02 | Disposition: A | Discharge status: Home / Self Care | Payer: PRIVATE HEALTH INSURANCE | Source: Ambulatory Visit | Attending: Obstetrics and Gynecology | Admitting: Obstetrics and Gynecology

## 2019-10-02 DIAGNOSIS — Z3A23 23 weeks gestation of pregnancy: Secondary | ICD-10-CM | POA: Diagnosis not present

## 2019-10-02 DIAGNOSIS — Z363 Encounter for antenatal screening for malformations: Secondary | ICD-10-CM

## 2019-10-02 DIAGNOSIS — Z362 Encounter for other antenatal screening follow-up: Secondary | ICD-10-CM

## 2019-10-05 ENCOUNTER — Ambulatory Visit (INDEPENDENT_AMBULATORY_CARE_PROVIDER_SITE_OTHER): Payer: PRIVATE HEALTH INSURANCE | Admitting: *Deleted

## 2019-10-05 ENCOUNTER — Other Ambulatory Visit: Payer: Self-pay

## 2019-10-05 VITALS — BP 124/85 | HR 90

## 2019-10-05 DIAGNOSIS — R300 Dysuria: Secondary | ICD-10-CM

## 2019-10-05 LAB — POCT URINALYSIS DIPSTICK

## 2019-10-05 MED ORDER — NITROFURANTOIN MONOHYD MACRO 100 MG PO CAPS: 100.0000 mg | ORAL_CAPSULE | Freq: Two times a day (BID) | ORAL | 1 refills | Status: DC

## 2019-10-05 NOTE — Progress Notes (Signed)
SUBJECTIVE: Danielle Morgan is a 24 y.o. female who complains of urinary frequency, urgency and dysuria x 2 days, without flank pain, fever, chills, or abnormal vaginal discharge or bleeding.   OBJECTIVE: Appears well, in no apparent distress.  Vital signs are normal. Urine dipstick shows positive for WBC's and RBC's.    ASSESSMENT: Dysuria  PLAN: Treatment per orders.  Call or return to clinic prn if these symptoms worsen or fail to improve as anticipated.

## 2019-10-05 NOTE — Progress Notes (Signed)
Patient seen and assessed by nursing staff.  Agree with documentation and plan.  

## 2019-10-07 ENCOUNTER — Other Ambulatory Visit: Payer: Self-pay | Admitting: Family Medicine

## 2019-10-07 LAB — CULTURE, OB URINE

## 2019-10-07 LAB — URINE CULTURE, OB REFLEX

## 2019-10-07 MED ORDER — NITROFURANTOIN MONOHYD MACRO 100 MG PO CAPS: 100.0000 mg | ORAL_CAPSULE | Freq: Two times a day (BID) | ORAL | 1 refills | Status: DC

## 2019-10-07 NOTE — Progress Notes (Signed)
Has UTI--rx sent in

## 2019-10-19 ENCOUNTER — Telehealth: Payer: Self-pay

## 2019-10-19 NOTE — Telephone Encounter (Signed)
Received call from patient she reports falling on some ice this morning. She fell on her back side. Patient reports baby is moving well she does have a little pain from her leg at this time. I advised her to continued to monitor her symptoms. If she should have any bleeding, severe pain or baby is not moving she should go to [facility] to be seen. Patient voice understanding at this time.

## 2019-10-20 ENCOUNTER — Other Ambulatory Visit: Payer: Self-pay

## 2019-10-20 ENCOUNTER — Ambulatory Visit: Payer: Self-pay | Admitting: *Deleted

## 2019-10-20 VITALS — BP 105/70 | HR 72

## 2019-10-20 DIAGNOSIS — Z3480 Encounter for supervision of other normal pregnancy, unspecified trimester: Secondary | ICD-10-CM

## 2019-10-20 NOTE — Progress Notes (Signed)
Routine BP check per ob/gyn request.

## 2019-10-28 ENCOUNTER — Other Ambulatory Visit: Payer: Self-pay

## 2019-10-28 ENCOUNTER — Encounter: Payer: Self-pay | Admitting: Family Medicine

## 2019-10-28 ENCOUNTER — Ambulatory Visit (INDEPENDENT_AMBULATORY_CARE_PROVIDER_SITE_OTHER): Payer: PRIVATE HEALTH INSURANCE | Admitting: Family Medicine

## 2019-10-28 VITALS — BP 126/81 | HR 80 | Wt 196.0 lb

## 2019-10-28 DIAGNOSIS — Z3480 Encounter for supervision of other normal pregnancy, unspecified trimester: Secondary | ICD-10-CM

## 2019-10-28 DIAGNOSIS — Z3A27 27 weeks gestation of pregnancy: Secondary | ICD-10-CM

## 2019-10-28 DIAGNOSIS — Z3482 Encounter for supervision of other normal pregnancy, second trimester: Secondary | ICD-10-CM

## 2019-10-28 DIAGNOSIS — Z23 Encounter for immunization: Secondary | ICD-10-CM | POA: Diagnosis not present

## 2019-10-28 NOTE — Progress Notes (Signed)
   PRENATAL VISIT NOTE  Subjective:  Danielle Morgan is a 24 y.o. G3P1011 at [redacted]w[redacted]d being seen today for ongoing prenatal care.  She is currently monitored for the following issues for this low-risk pregnancy and has Supervision of other normal pregnancy, antepartum and Migraines on their problem list.  Patient reports no complaints.  Contractions: Irritability. Vag. Bleeding: None.  Movement: Present. Denies leaking of fluid.   The following portions of the patient's history were reviewed and updated as appropriate: allergies, current medications, past family history, past medical history, past social history, past surgical history and problem list.   Objective:   Vitals:   10/28/19 0827  BP: 126/81  Pulse: 80  Weight: 196 lb (88.9 kg)    Fetal Status: Fetal Heart Rate (bpm): 138   Movement: Present     General:  Alert, oriented and cooperative. Patient is in no acute distress.  Skin: Skin is warm and dry. No rash noted.   Cardiovascular: Normal heart rate noted  Respiratory: Normal respiratory effort, no problems with respiration noted  Abdomen: Soft, gravid, appropriate for gestational age.  Pain/Pressure: Absent     Pelvic: Cervical exam deferred        Extremities: Normal range of motion.  Edema: None  Mental Status: Normal mood and affect. Normal behavior. Normal judgment and thought content.   Assessment and Plan:  Pregnancy: G3P1011 at [redacted]w[redacted]d  1. Supervision of other normal pregnancy, antepartum Up to date Reviewed COVID policy - Glucose Tolerance, 2 Hours w/1 Hour - RPR - HIV Antibody (routine testing w rflx) - CBC  Preterm labor symptoms and general obstetric precautions including but not limited to vaginal bleeding, contractions, leaking of fluid and fetal movement were reviewed in detail with the patient. Please refer to After Visit Summary for other counseling recommendations.   Return in about 2 weeks (around 11/11/2019) for Routine prenatal care,  Telehealth/Virtual health OB Visit.  Future Appointments  Date Time Provider Department Center  10/28/2019 10:00 AM Federico Flake, MD [facility]    Federico Flake, MD

## 2019-10-29 LAB — CBC
Hematocrit: 34.6 % (ref 34.0–46.6)
Hemoglobin: 11.3 g/dL (ref 11.1–15.9)
MCH: 30.9 pg (ref 26.6–33.0)
MCHC: 32.7 g/dL (ref 31.5–35.7)
MCV: 95 fL (ref 79–97)
Platelets: 242 10*3/uL (ref 150–450)
RBC: 3.66 x10E6/uL — ABNORMAL LOW (ref 3.77–5.28)
RDW: 11.6 % — ABNORMAL LOW (ref 11.7–15.4)
WBC: 10.5 10*3/uL (ref 3.4–10.8)

## 2019-10-29 LAB — GLUCOSE TOLERANCE, 2 HOURS W/ 1HR
Glucose, 1 hour: 117 mg/dL (ref 65–179)
Glucose, 2 hour: 95 mg/dL (ref 65–152)
Glucose, Fasting: 78 mg/dL (ref 65–91)

## 2019-10-29 LAB — HIV ANTIBODY (ROUTINE TESTING W REFLEX): HIV Screen 4th Generation wRfx: NONREACTIVE

## 2019-10-29 LAB — RPR: RPR Ser Ql: NONREACTIVE

## 2019-11-10 ENCOUNTER — Other Ambulatory Visit: Payer: Self-pay

## 2019-11-10 ENCOUNTER — Telehealth (INDEPENDENT_AMBULATORY_CARE_PROVIDER_SITE_OTHER): Payer: PRIVATE HEALTH INSURANCE | Admitting: Obstetrics and Gynecology

## 2019-11-10 DIAGNOSIS — Z3480 Encounter for supervision of other normal pregnancy, unspecified trimester: Secondary | ICD-10-CM

## 2019-11-10 NOTE — Progress Notes (Signed)
   TELEHEALTH VIRTUAL OBSTETRICS VISIT ENCOUNTER NOTE  Clinic: [facility]  I connected with Danielle Morgan on 11/10/19 at  4:00 PM EST by telephone at home and verified that I am speaking with the correct person using two identifiers.   I discussed the limitations, risks, security and privacy concerns of performing an evaluation and management service by telephone and the availability of in person appointments. I also discussed with the patient that there may be a patient responsible charge related to this service. The patient expressed understanding and agreed to proceed.  Subjective:  Danielle Morgan is a 24 y.o. G3P1011 at [redacted]w[redacted]d being followed for ongoing prenatal care.  She is currently monitored for the following issues for this low-risk pregnancy and has Supervision of other normal pregnancy, antepartum and Migraines on their problem list.  Patient reports no complaints. Reports fetal movement. Denies any contractions, bleeding or leaking of fluid.   The following portions of the patient's history were reviewed and updated as appropriate: allergies, current medications, past family history, past medical history, past social history, past surgical history and problem list.   Objective:  There were no vitals filed for this visit.  Babyscripts Data Reviewed: not applicable  General:  Alert, oriented and cooperative.   Mental Status: Normal mood and affect perceived. Normal judgment and thought content.  Rest of physical exam deferred due to type of encounter  Assessment and Plan:  Pregnancy: G3P1011 at [redacted]w[redacted]d 1. Supervision of other normal pregnancy, antepartum Routine care. Pt to get bp and weight at her job and let us know later this week. 28wk labs neg  Preterm labor symptoms and general obstetric precautions including but not limited to vaginal bleeding, contractions, leaking of fluid and fetal movement were reviewed in detail with the patient.  I discussed the  assessment and treatment plan with the patient. The patient was provided an opportunity to ask questions and all were answered. The patient agreed with the plan and demonstrated an understanding of the instructions. The patient was advised to call back or seek an in-person office evaluation/go to MAU at [facility] for any urgent or concerning symptoms. Please refer to After Visit Summary for other counseling recommendations.   I provided 7 minutes of non-face-to-face time during this encounter. The visit was conducted via MyChart-medicine  No follow-ups on file.  Future Appointments  Date Time Provider Department Center  12/01/2019  4:15 PM [place] Bing, MD [facility]     Bing, MD Center for [facility], [facility]

## 2019-11-10 NOTE — Progress Notes (Signed)
I connected with  [name] on 11/10/19 at  4:00 PM EST by telephone and verified that I am speaking with the correct person using two identifiers.   I discussed the limitations, risks, security and privacy concerns of performing an evaluation and management service by telephone and the availability of in person appointments. I also discussed with the patient that there may be a patient responsible charge related to this service. The patient expressed understanding and agreed to proceed.  Scheryl Marten, RN 11/10/2019  4:05 PM

## 2019-11-12 ENCOUNTER — Other Ambulatory Visit: Payer: Self-pay

## 2019-11-12 ENCOUNTER — Ambulatory Visit: Payer: Self-pay | Admitting: *Deleted

## 2019-11-12 VITALS — BP 121/77 | HR 95 | Wt 199.0 lb

## 2019-11-12 DIAGNOSIS — Z0130 Encounter for examination of blood pressure without abnormal findings: Secondary | ICD-10-CM

## 2019-11-12 DIAGNOSIS — Z3480 Encounter for supervision of other normal pregnancy, unspecified trimester: Secondary | ICD-10-CM

## 2019-11-12 NOTE — Progress Notes (Signed)
BP and Wt check per ob/gyn request. Routed to gyn office pool.

## 2019-11-24 ENCOUNTER — Telehealth: Payer: Self-pay | Admitting: *Deleted

## 2019-11-24 NOTE — Telephone Encounter (Signed)
Pt returned call, states her pain is better, she took one oh her flexeril she has for migraines. Suggested to pt to try a pregnancy massage to see if that will help as well.

## 2019-11-24 NOTE — Telephone Encounter (Signed)
Left message for pt to call back regarding after hours nurse line message of complaints of hip and back pain.

## 2019-11-26 ENCOUNTER — Telehealth: Payer: Self-pay | Admitting: Internal Medicine

## 2019-11-26 NOTE — Telephone Encounter (Signed)
FMLA paperwork completed and faxed. Patient aware paperwork up front for pick up.

## 2019-12-01 ENCOUNTER — Telehealth (INDEPENDENT_AMBULATORY_CARE_PROVIDER_SITE_OTHER): Payer: PRIVATE HEALTH INSURANCE | Admitting: Obstetrics and Gynecology

## 2019-12-01 ENCOUNTER — Other Ambulatory Visit: Payer: Self-pay

## 2019-12-01 ENCOUNTER — Encounter: Payer: Self-pay | Admitting: Obstetrics and Gynecology

## 2019-12-01 DIAGNOSIS — Z3480 Encounter for supervision of other normal pregnancy, unspecified trimester: Secondary | ICD-10-CM

## 2019-12-01 NOTE — Progress Notes (Signed)
   TELEHEALTH VIRTUAL OBSTETRICS VISIT ENCOUNTER NOTE  Clinic: [facility]  I connected with Danielle Morgan on 12/01/19 at  4:15 PM EDT by telephone at home and verified that I am speaking with the correct person using two identifiers.   I discussed the limitations, risks, security and privacy concerns of performing an evaluation and management service by telephone and the availability of in person appointments. I also discussed with the patient that there may be a patient responsible charge related to this service. The patient expressed understanding and agreed to proceed.  Subjective:  Danielle Morgan is a 24 y.o. G3P1011 at [redacted]w[redacted]d being followed for ongoing prenatal care.  She is currently monitored for the following issues for this low-risk pregnancy and has Supervision of other normal pregnancy, antepartum and Migraines on their problem list.  Patient reports no complaints. Reports fetal movement. Denies any contractions, bleeding or leaking of fluid.   The following portions of the patient's history were reviewed and updated as appropriate: allergies, current medications, past family history, past medical history, past social history, past surgical history and problem list.   Objective:  There were no vitals filed for this visit.  Babyscripts Data Reviewed: not applicable  General:  Alert, oriented and cooperative.   Mental Status: Normal mood and affect perceived. Normal judgment and thought content.  Rest of physical exam deferred due to type of encounter  Assessment and Plan:  Pregnancy: G3P1011 at [redacted]w[redacted]d 1. Supervision of other normal pregnancy, antepartum Routine care Behavioral interventions to help with normal pregnancy aches and pains  Preterm labor symptoms and general obstetric precautions including but not limited to vaginal bleeding, contractions, leaking of fluid and fetal movement were reviewed in detail with the patient.  I discussed the assessment  and treatment plan with the patient. The patient was provided an opportunity to ask questions and all were answered. The patient agreed with the plan and demonstrated an understanding of the instructions. The patient was advised to call back or seek an in-person office evaluation/go to MAU at [facility] for any urgent or concerning symptoms. Please refer to After Visit Summary for other counseling recommendations.   I provided 7 minutes of non-face-to-face time during this encounter. The visit was conducted via MyChart-medicine  No follow-ups on file.  Future Appointments  Date Time Provider Department Center  12/16/2019  4:00 PM [place] Cantor, CNM [facility]    [place] Bing, MD Center for [employer], [facility]

## 2019-12-01 NOTE — Progress Notes (Signed)
Patient will call back with blood pressure

## 2019-12-10 ENCOUNTER — Encounter: Payer: Self-pay | Admitting: Registered Nurse

## 2019-12-10 ENCOUNTER — Telehealth: Payer: Self-pay | Admitting: Internal Medicine

## 2019-12-10 ENCOUNTER — Telehealth: Payer: Self-pay | Admitting: Registered Nurse

## 2019-12-10 NOTE — Telephone Encounter (Signed)
We can work her in, [place] can you find an appropriate slot

## 2019-12-10 NOTE — Telephone Encounter (Signed)
Patient called today about her physical schedule 4/29 She stated that due to her husbands insurance she needs to have this done sooner. She stated the week before would be better so he can have the information turned in.   I have looked and did not see any openings right now. Can we work her in sooner?  I let patient know that I could not guarantee anything but would check.

## 2019-12-10 NOTE — Telephone Encounter (Signed)
Notified by HR Salley Slaughter patient's son exposed to covid at daycare.  Son has been having some nasal symptoms and daycare requested covid testing.  Patient also pregnant [redacted] weeks.  Patient not currently working onsite due to son no daycare.  Will follow up with patient today as no covid testing noted in Epic.

## 2019-12-10 NOTE — Telephone Encounter (Signed)
Patient contacted via telephone and she reported son covid test negative but he is still having some nasal discharge/crusting around nares of mucous.  She found out on Facebook son's daycare teacher was positive child for covid at daycare.  Son has not been to daycare since 02 Dec 2019.  She is unsure when other child starting having symptoms.  Discussed most infectious time 2-3 days prior to symptoms starting with covid.  Daycare is closed until 14 Dec 2019 and Jassmine has to stay home and care for son until that time.  Discussed signs and symptoms of covid. Discussed that I have seen patients with positive covid test without fever but typically nasal or GI symptoms with or without fever. Recommended retesting for son and her if new GI symptoms or new symptoms especially fever/chills/body aches/n/v/d/cough/sore throat.  Discussed allergic rhinitis common in community at this time due to elevated pollen counts with spring bloom.  Patient may reach me via mychart or [email or web address] if questions develop between now and Monday. Discussed RN Rolly Salter on vacation and clinic closed tomorrow but she will be at work Monday.  She reported pediatrician office is very responsive via telephone also.  Patient verbalized understanding information/instructions, agreed with plan of care and had no further questions at this time.  HR Tim notified patient planning to stay home until Monday 12/14/2019 no testing indicated at this time unless son or patient develop new covid consistent symptoms prior to that time.  Son with negative test results this week.

## 2019-12-10 NOTE — Telephone Encounter (Signed)
4/14 @ 2:45 4/15 @ 11:45 4/23 @ 3:45  

## 2019-12-14 NOTE — Telephone Encounter (Signed)
Mesg left for pt requesting call back to either myself or [employer] in case RN has left clinic for the day if she or her son has developed sx over the past week since speaking with NP Inetta Fermo on Thursday 3/25. If either have developed sx, pt will need to notify myself and/or Tim [employer] and stay out of work past the planned return date of 3/30. Call back number for RN and HR provided in mesg.

## 2019-12-15 NOTE — Telephone Encounter (Signed)
noted 

## 2019-12-16 ENCOUNTER — Telehealth: Payer: PRIVATE HEALTH INSURANCE | Admitting: Advanced Practice Midwife

## 2019-12-17 ENCOUNTER — Other Ambulatory Visit: Payer: Self-pay

## 2019-12-17 ENCOUNTER — Ambulatory Visit: Payer: Self-pay | Admitting: *Deleted

## 2019-12-17 VITALS — BP 122/84 | HR 98 | Wt 203.0 lb

## 2019-12-17 DIAGNOSIS — Z3480 Encounter for supervision of other normal pregnancy, unspecified trimester: Secondary | ICD-10-CM

## 2019-12-17 DIAGNOSIS — Z0130 Encounter for examination of blood pressure without abnormal findings: Secondary | ICD-10-CM

## 2019-12-17 NOTE — Progress Notes (Signed)
Routine BP check and Wt check as part of prenatal care. Will forward to OB/GYN at pt's request.

## 2019-12-30 ENCOUNTER — Other Ambulatory Visit (HOSPITAL_COMMUNITY)
Admission: RE | Admit: 2019-12-30 | Discharge: 2019-12-30 | Disposition: A | Discharge status: Home / Self Care | Payer: PRIVATE HEALTH INSURANCE | Source: Ambulatory Visit | Attending: Advanced Practice Midwife | Admitting: Advanced Practice Midwife

## 2019-12-30 ENCOUNTER — Other Ambulatory Visit: Payer: Self-pay

## 2019-12-30 ENCOUNTER — Ambulatory Visit (INDEPENDENT_AMBULATORY_CARE_PROVIDER_SITE_OTHER): Payer: PRIVATE HEALTH INSURANCE | Admitting: Internal Medicine

## 2019-12-30 ENCOUNTER — Encounter: Payer: Self-pay | Admitting: Internal Medicine

## 2019-12-30 ENCOUNTER — Encounter: Payer: Self-pay | Admitting: Radiology

## 2019-12-30 ENCOUNTER — Ambulatory Visit (INDEPENDENT_AMBULATORY_CARE_PROVIDER_SITE_OTHER): Payer: PRIVATE HEALTH INSURANCE | Admitting: Advanced Practice Midwife

## 2019-12-30 VITALS — BP 124/78 | HR 91 | Temp 98.1°F | Ht 65.25 in | Wt 208.0 lb

## 2019-12-30 VITALS — BP 136/88 | HR 72 | Wt 209.0 lb

## 2019-12-30 DIAGNOSIS — G43C0 Periodic headache syndromes in child or adult, not intractable: Secondary | ICD-10-CM | POA: Diagnosis not present

## 2019-12-30 DIAGNOSIS — G43009 Migraine without aura, not intractable, without status migrainosus: Secondary | ICD-10-CM

## 2019-12-30 DIAGNOSIS — Z3493 Encounter for supervision of normal pregnancy, unspecified, third trimester: Secondary | ICD-10-CM

## 2019-12-30 DIAGNOSIS — Z3483 Encounter for supervision of other normal pregnancy, third trimester: Secondary | ICD-10-CM

## 2019-12-30 DIAGNOSIS — Z3A36 36 weeks gestation of pregnancy: Secondary | ICD-10-CM

## 2019-12-30 DIAGNOSIS — Z0000 Encounter for general adult medical examination without abnormal findings: Secondary | ICD-10-CM

## 2019-12-30 DIAGNOSIS — Z3480 Encounter for supervision of other normal pregnancy, unspecified trimester: Secondary | ICD-10-CM | POA: Insufficient documentation

## 2019-12-30 NOTE — Progress Notes (Signed)
Subjective:    Patient ID: Danielle Morgan, female    DOB: 11/03/1995, 24 y.o.   MRN: [id number]  HPI  Pt presents to the clinic today for her annual exam.  Frequent Headaches: These have improved during her pregnancy. She takes Tylenol as needed with good relief of symptoms. She is prescribed Fioricet, Flexeril and Promethazine which she is not currently taking while pregnant. She does not follow with neurology.  Flu: 06/2019 Tetanus: 10/2019 Pap Smear: 05/2018 Dentist: biannually  Diet: She does eat meat. She consumes some fruits and veggies. She does eat some fried foods. She drinks mostly water, sweet tea and milk. Exercise: None  Review of Systems      Past Medical History:  Diagnosis Date  . Frequent headaches     Current Outpatient Medications  Medication Sig Dispense Refill  . Butalbital-APAP-Caffeine 50-325-40 MG capsule Take 1-2 capsules by mouth every 6 (six) hours as needed for headache. (Patient not taking: Reported on 12/01/2019) 30 capsule 1  . cyclobenzaprine (FLEXERIL) 10 MG tablet Take 1 tablet (10 mg total) by mouth every 8 (eight) hours as needed for muscle spasms. (Patient not taking: Reported on 09/03/2019) 30 tablet 3  . Magnesium Malate 1250 (141.7 Mg) MG TABS Take 1 tablet by mouth daily as needed (headache).    . nitrofurantoin, macrocrystal-monohydrate, (MACROBID) 100 MG capsule Take 1 capsule (100 mg total) by mouth 2 (two) times daily. (Patient not taking: Reported on 10/28/2019) 14 capsule 1  . Prenatal Vit-Fe Fumarate-FA (MULTIVITAMIN-PRENATAL) 27-0.8 MG TABS tablet Take 1 tablet by mouth daily at 12 noon.    . promethazine (PHENERGAN) 12.5 MG tablet Take 1 tablet (12.5 mg total) by mouth every 6 (six) hours as needed for nausea or vomiting. (Patient not taking: Reported on 09/03/2019) 30 tablet 0  . terconazole (TERAZOL 7) 0.4 % vaginal cream Place 1 applicator vaginally at bedtime. Use for seven days (Patient not taking: Reported on 10/28/2019) 45 g 0    No current facility-administered medications for this visit.    No Known Allergies  Family History  Problem Relation Age of Onset  . Arthritis Mother   . Bipolar disorder Father   . Cancer Paternal Grandmother        Lung  . ADD / ADHD Sister   . ADD / ADHD Brother   . Heart disease Maternal Uncle   . Arthritis Maternal Grandfather   . Hypertension Maternal Grandfather   . Alcohol abuse Maternal Grandfather     Social History   Socioeconomic History  . Marital status: Married    Spouse name: Not on file  . Number of children: Not on file  . Years of education: Not on file  . Highest education level: Not on file  Occupational History  . Not on file  Tobacco Use  . Smoking status: Never Smoker  . Smokeless tobacco: Never Used  Substance and Sexual Activity  . Alcohol use: No    Alcohol/week: 0.0 standard drinks  . Drug use: No  . Sexual activity: Yes    Partners: Male    Birth control/protection: None  Other Topics Concern  . Not on file  Social History Narrative  . Not on file   Social Determinants of Health   Financial Resource Strain:   . Difficulty of Paying Living Expenses:   Food Insecurity:   . Worried About Programme researcher, broadcasting/film/video in the Last Year:   . Barista in the Last Year:   [employer]  Needs:   . Lack of Transportation (Medical):   Marland Kitchen Lack of Transportation (Non-Medical):   Physical Activity:   . Days of Exercise per Week:   . Minutes of Exercise per Session:   Stress:   . Feeling of Stress :   Social Connections:   . Frequency of Communication with Friends and Family:   . Frequency of Social Gatherings with Friends and Family:   . Attends Religious Services:   . Active Member of Clubs or Organizations:   . Attends Archivist Meetings:   Marland Kitchen Marital Status:   Intimate Partner Violence:   . Fear of Current or Ex-Partner:   . Emotionally Abused:   Marland Kitchen Physically Abused:   . Sexually Abused:      Constitutional: Pt  reports intermittent headache. Denies fever, malaise, fatigue, or abrupt weight changes.  HEENT: Denies eye pain, eye redness, ear pain, ringing in the ears, wax buildup, runny nose, nasal congestion, bloody nose, or sore throat. Respiratory: Denies difficulty breathing, shortness of breath, cough or sputum production.   Cardiovascular: Denies chest pain, chest tightness, palpitations or swelling in the hands or feet.  Gastrointestinal: Denies abdominal pain, bloating, constipation, diarrhea or blood in the stool.  GU: Denies urgency, frequency, pain with urination, burning sensation, blood in urine, odor or discharge. Musculoskeletal: Denies decrease in range of motion, difficulty with gait, muscle pain or joint pain and swelling.  Skin: Denies redness, rashes, lesions or ulcercations.  Neurological: Denies dizziness, difficulty with memory, difficulty with speech or problems with balance and coordination.  Psych: Denies anxiety, depression, SI/HI.  No other specific complaints in a complete review of systems (except as listed in HPI above).  Objective:   Physical Exam   BP 124/78   Pulse 91   Temp 98.1 F (36.7 C) (Temporal)   Ht 5' 5.25" (1.657 m)   Wt 208 lb (94.3 kg)   LMP 04/20/2019 (Approximate)   SpO2 98%   BMI 34.35 kg/m   Wt Readings from Last 3 Encounters:  12/17/19 203 lb (92.1 kg)  11/12/19 199 lb (90.3 kg)  10/28/19 196 lb (88.9 kg)    General: Appears her stated age, well developed, well nourished in NAD. Skin: Warm, dry and intact. No rashes noted. HEENT: Head: normal shape and size; Eyes: sclera white, no icterus, conjunctiva pink, PERRLA and EOMs intact;Nose: mucosa pink and moist, septum midline; Throat/Mouth: Teeth present, mucosa pink and moist, no exudate, lesions or ulcerations noted.  Neck:  Neck supple, trachea midline. No masses, lumps or thyromegaly present.  Cardiovascular: Normal rate and rhythm. S1,S2 noted.  No murmur, rubs or gallops noted. No JVD  or BLE edema. Pulmonary/Chest: Normal effort and positive vesicular breath sounds. No respiratory distress. No wheezes, rales or ronchi noted.  Abdomen: Pregnant. Musculoskeletal: Normal range of motion. No signs of joint swelling. No difficulty with gait.  Neurological: Alert and oriented. Cranial nerves II-XII grossly intact. Coordination normal.  Psychiatric: Mood and affect normal. Behavior is normal. Judgment and thought content normal.    BMET    Component Value Date/Time   NA 137 10/23/2018 1044   K 4.2 10/23/2018 1044   CL 102 10/23/2018 1044   CO2 25 12/11/2016 1045   GLUCOSE 77 10/23/2018 1044   GLUCOSE 87 12/11/2016 1045   BUN 10 10/23/2018 1044   CREATININE 0.64 10/23/2018 1044   CALCIUM 9.2 10/23/2018 1044   GFRNONAA 127 10/23/2018 1044   GFRAA 146 10/23/2018 1044    Lipid Panel  Component Value Date/Time   CHOL 167 10/23/2018 1044   TRIG 59 10/23/2018 1044   HDL 52 10/23/2018 1044   CHOLHDL 3.2 10/23/2018 1044   CHOLHDL 4 12/11/2016 1045   VLDL 20.4 12/11/2016 1045   LDLCALC 103 (H) 10/23/2018 1044    CBC    Component Value Date/Time   WBC 10.5 10/28/2019 0823   WBC 6.4 12/11/2016 1045   RBC 3.66 (L) 10/28/2019 0823   RBC 4.59 12/11/2016 1045   HGB 11.3 10/28/2019 0823   HCT 34.6 10/28/2019 0823   PLT 242 10/28/2019 0823   MCV 95 10/28/2019 0823   MCH 30.9 10/28/2019 0823   MCH 23.9 (L) 11/16/2015 0545   MCHC 32.7 10/28/2019 0823   MCHC 32.9 12/11/2016 1045   RDW 11.6 (L) 10/28/2019 0823   LYMPHSABS 1.8 07/07/2019 1535   MONOABS 0.9 04/26/2015 1053   EOSABS 0.1 07/07/2019 1535   BASOSABS 0.0 07/07/2019 1535    Hgb A1C Lab Results  Component Value Date   HGBA1C 5.0 08/21/2019           Assessment & Plan:   Preventative Health Maintenance:  Flu shot UTD Tetanus UTD Pap smear due 11/2020 Encouraged her to consume a balanced diet and exercise regimen Advised her to see a dentist annually Will check CBC, CMET, and Lipid profile  today  RTC in 1 year, sooner if needed   Nicki Reaper, NP This visit occurred during the SARS-CoV-2 public health emergency.  Safety protocols were in place, including screening questions prior to the visit, additional usage of staff PPE, and extensive cleaning of exam room while observing appropriate contact time as indicated for disinfecting solutions.

## 2019-12-30 NOTE — Patient Instructions (Signed)
Health Maintenance, Female Adopting a healthy lifestyle and getting preventive care are important in promoting health and wellness. Ask your health care provider about:  The right schedule for you to have regular tests and exams.  Things you can do on your own to prevent diseases and keep yourself healthy. What should I know about diet, weight, and exercise? Eat a healthy diet   Eat a diet that includes plenty of vegetables, fruits, low-fat dairy products, and lean protein.  Do not eat a lot of foods that are high in solid fats, added sugars, or sodium. Maintain a healthy weight Body mass index (BMI) is used to identify weight problems. It estimates body fat based on height and weight. Your health care provider can help determine your BMI and help you achieve or maintain a healthy weight. Get regular exercise Get regular exercise. This is one of the most important things you can do for your health. Most adults should:  Exercise for at least 150 minutes each week. The exercise should increase your heart rate and make you sweat (moderate-intensity exercise).  Do strengthening exercises at least twice a week. This is in addition to the moderate-intensity exercise.  Spend less time sitting. Even light physical activity can be beneficial. Watch cholesterol and blood lipids Have your blood tested for lipids and cholesterol at 24 years of age, then have this test every 5 years. Have your cholesterol levels checked more often if:  Your lipid or cholesterol levels are high.  You are older than 24 years of age.  You are at high risk for heart disease. What should I know about cancer screening? Depending on your health history and family history, you may need to have cancer screening at various ages. This may include screening for:  Breast cancer.  Cervical cancer.  Colorectal cancer.  Skin cancer.  Lung cancer. What should I know about heart disease, diabetes, and high blood  pressure? Blood pressure and heart disease  High blood pressure causes heart disease and increases the risk of stroke. This is more likely to develop in people who have high blood pressure readings, are of African descent, or are overweight.  Have your blood pressure checked: ? Every 3-5 years if you are 18-39 years of age. ? Every year if you are 40 years old or older. Diabetes Have regular diabetes screenings. This checks your fasting blood sugar level. Have the screening done:  Once every three years after age 40 if you are at a normal weight and have a low risk for diabetes.  More often and at a younger age if you are overweight or have a high risk for diabetes. What should I know about preventing infection? Hepatitis B If you have a higher risk for hepatitis B, you should be screened for this virus. Talk with your health care provider to find out if you are at risk for hepatitis B infection. Hepatitis C Testing is recommended for:  Everyone born from 1945 through 1965.  Anyone with known risk factors for hepatitis C. Sexually transmitted infections (STIs)  Get screened for STIs, including gonorrhea and chlamydia, if: ? You are sexually active and are younger than 24 years of age. ? You are older than 24 years of age and your health care provider tells you that you are at risk for this type of infection. ? Your sexual activity has changed since you were last screened, and you are at increased risk for chlamydia or gonorrhea. Ask your health care provider if   you are at risk.  Ask your health care provider about whether you are at high risk for HIV. Your health care provider may recommend a prescription medicine to help prevent HIV infection. If you choose to take medicine to prevent HIV, you should first get tested for HIV. You should then be tested every 3 months for as long as you are taking the medicine. Pregnancy  If you are about to stop having your period (premenopausal) and  you may become pregnant, seek counseling before you get pregnant.  Take 400 to 800 micrograms (mcg) of folic acid every day if you become pregnant.  Ask for birth control (contraception) if you want to prevent pregnancy. Osteoporosis and menopause Osteoporosis is a disease in which the bones lose minerals and strength with aging. This can result in bone fractures. If you are 65 years old or older, or if you are at risk for osteoporosis and fractures, ask your health care provider if you should:  Be screened for bone loss.  Take a calcium or vitamin D supplement to lower your risk of fractures.  Be given hormone replacement therapy (HRT) to treat symptoms of menopause. Follow these instructions at home: Lifestyle  Do not use any products that contain nicotine or tobacco, such as cigarettes, e-cigarettes, and chewing tobacco. If you need help quitting, ask your health care provider.  Do not use street drugs.  Do not share needles.  Ask your health care provider for help if you need support or information about quitting drugs. Alcohol use  Do not drink alcohol if: ? Your health care provider tells you not to drink. ? You are pregnant, may be pregnant, or are planning to become pregnant.  If you drink alcohol: ? Limit how much you use to 0-1 drink a day. ? Limit intake if you are breastfeeding.  Be aware of how much alcohol is in your drink. In the [place] one drink equals one 12 oz bottle of beer (355 mL), one 5 oz glass of wine (148 mL), or one 1 oz glass of hard liquor (44 mL). General instructions  Schedule regular health, dental, and eye exams.  Stay current with your vaccines.  Tell your health care provider if: ? You often feel depressed. ? You have ever been abused or do not feel safe at home. Summary  Adopting a healthy lifestyle and getting preventive care are important in promoting health and wellness.  Follow your health care provider's instructions about healthy  diet, exercising, and getting tested or screened for diseases.  Follow your health care provider's instructions on monitoring your cholesterol and blood pressure. This information is not intended to replace advice given to you by your health care provider. Make sure you discuss any questions you have with your health care provider. Document Revised: 08/27/2018 Document Reviewed: 08/27/2018 Elsevier Patient Education  2020 Elsevier Inc.  

## 2019-12-30 NOTE — Patient Instructions (Signed)

## 2019-12-30 NOTE — Assessment & Plan Note (Signed)
Continue Tylenol as needed Will monitor 

## 2019-12-30 NOTE — Progress Notes (Signed)
   PRENATAL VISIT NOTE  Subjective:  Danielle Morgan is a 24 y.o. G3P1011 at [redacted]w[redacted]d being seen today for ongoing prenatal care.  She is currently monitored for the following issues for this low-risk pregnancy and has Supervision of other normal pregnancy, antepartum and Migraines on their problem list.  Patient reports pelvic pain, sciatic nerve pain. Her employer is allowing her to park in wheelchair-accessible parking and she requests a note to support that..  Contractions: Not present. Vag. Bleeding: None.  Movement: Present. Denies leaking of fluid.   The following portions of the patient's history were reviewed and updated as appropriate: allergies, current medications, past family history, past medical history, past social history, past surgical history and problem list. Problem list updated.  Objective:   Vitals:   12/30/19 1607 12/30/19 2124  BP: (!) 142/89 136/88  Pulse: 72   Weight: 209 lb (94.8 kg)     Fetal Status: Fetal Heart Rate (bpm): 127 Fundal Height: 37 cm Movement: Present  Presentation: Vertex  General:  Alert, oriented and cooperative. Patient is in no acute distress.  Skin: Skin is warm and dry. No rash noted.   Cardiovascular: Normal heart rate noted  Respiratory: Normal respiratory effort, no problems with respiration noted  Abdomen: Soft, gravid, appropriate for gestational age.  Pain/Pressure: Absent     Pelvic: Cervical exam performed Dilation: Fingertip Effacement (%): Thick Station: Ballotable  Extremities: Normal range of motion.  Edema: None  Mental Status: Normal mood and affect. Normal behavior. Normal judgment and thought content.   Assessment and Plan:  Pregnancy: G3P1011 at [redacted]w[redacted]d  1. Supervision of other normal pregnancy, antepartum - Fetus not well engaged, discussed that pelvic pain may worsen with engagement - Elevated BP reading with Small cuff, normal with correct size cuff - Patient to recheck her BP at work tomorrow morning - Culture, beta  strep (group b only) - GC/Chlamydia probe amp ([place])not at [facility]  2. Migraine without aura and without status migrainosus, not intractable - Management by PCP, most recent visit earlier today  Preterm labor symptoms and general obstetric precautions including but not limited to vaginal bleeding, contractions, leaking of fluid and fetal movement were reviewed in detail with the patient. Please refer to After Visit Summary for other counseling recommendations.  Return in about 1 week (around 01/06/2020).  Future Appointments  Date Time Provider Department Center  01/13/2020  2:15 PM Calvert Cantor, CNM [facility]    Calvert Cantor, [place]

## 2019-12-31 ENCOUNTER — Ambulatory Visit: Payer: Self-pay | Admitting: *Deleted

## 2019-12-31 VITALS — BP 119/79 | HR 63

## 2019-12-31 DIAGNOSIS — Z3480 Encounter for supervision of other normal pregnancy, unspecified trimester: Secondary | ICD-10-CM

## 2019-12-31 DIAGNOSIS — Z0130 Encounter for examination of blood pressure without abnormal findings: Secondary | ICD-10-CM

## 2019-12-31 LAB — LIPID PANEL
Cholesterol: 257 mg/dL — ABNORMAL HIGH (ref 0–200)
HDL: 70.8 mg/dL (ref >39.00)
LDL Cholesterol: 152 mg/dL — ABNORMAL HIGH (ref 0–99)
NonHDL: 186.39
Total CHOL/HDL Ratio: 4
Triglycerides: 170 mg/dL — ABNORMAL HIGH (ref 0.0–149.0)
VLDL: 34 mg/dL (ref 0.0–40.0)

## 2019-12-31 LAB — COMPREHENSIVE METABOLIC PANEL
ALT: 10 U/L (ref 0–35)
AST: 19 U/L (ref 0–37)
Albumin: 3.4 g/dL — ABNORMAL LOW (ref 3.5–5.2)
Alkaline Phosphatase: 127 U/L — ABNORMAL HIGH (ref 39–117)
BUN: 7 mg/dL (ref 6–23)
CO2: 23 mEq/L (ref 19–32)
Calcium: 8.8 mg/dL (ref 8.4–10.5)
Chloride: 103 mEq/L (ref 96–112)
Creatinine, Ser: 0.49 mg/dL (ref 0.40–1.20)
GFR: 154.94 mL/min (ref >60.00)
Glucose, Bld: 65 mg/dL — ABNORMAL LOW (ref 70–99)
Potassium: 4 mEq/L (ref 3.5–5.1)
Sodium: 134 mEq/L — ABNORMAL LOW (ref 135–145)
Total Bilirubin: 0.3 mg/dL (ref 0.2–1.2)
Total Protein: 6.5 g/dL (ref 6.0–8.3)

## 2019-12-31 LAB — CBC
HCT: 32.8 % — ABNORMAL LOW (ref 36.0–46.0)
Hemoglobin: 10.9 g/dL — ABNORMAL LOW (ref 12.0–15.0)
MCHC: 33.1 g/dL (ref 30.0–36.0)
MCV: 85 fl (ref 78.0–100.0)
Platelets: 232 10*3/uL (ref 150.0–400.0)
RBC: 3.86 Mil/uL — ABNORMAL LOW (ref 3.87–5.11)
RDW: 14.5 % (ref 11.5–15.5)
WBC: 10.5 10*3/uL (ref 4.0–10.5)

## 2019-12-31 NOTE — Progress Notes (Signed)
BP recheck for OB as BP elevated at OV yesterday. WNL in clinic today.  Encounter will be routed to yesterday's provider Clayton Bibles, CNM.

## 2020-01-01 LAB — GC/CHLAMYDIA PROBE AMP (~~LOC~~) NOT AT ARMC
Chlamydia: NEGATIVE
Comment: NEGATIVE
Comment: NORMAL
Neisseria Gonorrhea: NEGATIVE

## 2020-01-01 NOTE — Telephone Encounter (Signed)
Danielle Morgan returned to work as expected and was in clinic yesterday for BP check as blood pressure was elevated at [facility] appt and provider wanted follow up reading and it was improved and patient feeling well.

## 2020-01-03 LAB — CULTURE, BETA STREP (GROUP B ONLY): Strep Gp B Culture: NEGATIVE

## 2020-01-13 ENCOUNTER — Inpatient Hospital Stay (HOSPITAL_COMMUNITY)
Admission: AD | Admit: 2020-01-13 | Discharge: 2020-01-15 | DRG: 807 | Disposition: A | Discharge status: Home / Self Care | Payer: No Typology Code available for payment source | Attending: Obstetrics and Gynecology | Admitting: Obstetrics and Gynecology

## 2020-01-13 ENCOUNTER — Encounter (HOSPITAL_COMMUNITY): Payer: Self-pay | Admitting: Obstetrics and Gynecology

## 2020-01-13 ENCOUNTER — Ambulatory Visit (INDEPENDENT_AMBULATORY_CARE_PROVIDER_SITE_OTHER): Payer: PRIVATE HEALTH INSURANCE | Admitting: Advanced Practice Midwife

## 2020-01-13 ENCOUNTER — Inpatient Hospital Stay (HOSPITAL_COMMUNITY): Payer: No Typology Code available for payment source | Admitting: Anesthesiology

## 2020-01-13 ENCOUNTER — Other Ambulatory Visit: Payer: Self-pay

## 2020-01-13 DIAGNOSIS — O134 Gestational [pregnancy-induced] hypertension without significant proteinuria, complicating childbirth: Secondary | ICD-10-CM | POA: Diagnosis present

## 2020-01-13 DIAGNOSIS — Z3480 Encounter for supervision of other normal pregnancy, unspecified trimester: Secondary | ICD-10-CM

## 2020-01-13 DIAGNOSIS — O139 Gestational [pregnancy-induced] hypertension without significant proteinuria, unspecified trimester: Secondary | ICD-10-CM | POA: Diagnosis present

## 2020-01-13 DIAGNOSIS — Z3A38 38 weeks gestation of pregnancy: Secondary | ICD-10-CM | POA: Diagnosis not present

## 2020-01-13 DIAGNOSIS — O133 Gestational [pregnancy-induced] hypertension without significant proteinuria, third trimester: Secondary | ICD-10-CM

## 2020-01-13 DIAGNOSIS — Z20822 Contact with and (suspected) exposure to covid-19: Secondary | ICD-10-CM | POA: Diagnosis present

## 2020-01-13 LAB — COMPREHENSIVE METABOLIC PANEL
ALT: 16 U/L (ref 0–44)
AST: 23 U/L (ref 15–41)
Albumin: 2.6 g/dL — ABNORMAL LOW (ref 3.5–5.0)
Alkaline Phosphatase: 148 U/L — ABNORMAL HIGH (ref 38–126)
Anion gap: 11 (ref 5–15)
BUN: 8 mg/dL (ref 6–20)
CO2: 19 mmol/L — ABNORMAL LOW (ref 22–32)
Calcium: 9 mg/dL (ref 8.9–10.3)
Chloride: 107 mmol/L (ref 98–111)
Creatinine, Ser: 0.49 mg/dL (ref 0.44–1.00)
GFR calc Af Amer: >60 mL/min (ref >60)
GFR calc non Af Amer: >60 mL/min (ref >60)
Glucose, Bld: 73 mg/dL (ref 70–99)
Potassium: 4.5 mmol/L (ref 3.5–5.1)
Sodium: 137 mmol/L (ref 135–145)
Total Bilirubin: 0.4 mg/dL (ref 0.3–1.2)
Total Protein: 5.9 g/dL — ABNORMAL LOW (ref 6.5–8.1)

## 2020-01-13 LAB — SARS CORONAVIRUS 2 (TAT 6-24 HRS): SARS Coronavirus 2: NEGATIVE

## 2020-01-13 LAB — CBC
HCT: 34.6 % — ABNORMAL LOW (ref 36.0–46.0)
Hemoglobin: 10.9 g/dL — ABNORMAL LOW (ref 12.0–15.0)
MCH: 26.7 pg (ref 26.0–34.0)
MCHC: 31.5 g/dL (ref 30.0–36.0)
MCV: 84.8 fL (ref 80.0–100.0)
Platelets: 269 10*3/uL (ref 150–400)
RBC: 4.08 MIL/uL (ref 3.87–5.11)
RDW: 14 % (ref 11.5–15.5)
WBC: 10.8 10*3/uL — ABNORMAL HIGH (ref 4.0–10.5)
nRBC: 0.2 % (ref 0.0–0.2)

## 2020-01-13 LAB — TYPE AND SCREEN
ABO/RH(D): A POS
Antibody Screen: NEGATIVE

## 2020-01-13 LAB — ABO/RH: ABO/RH(D): A POS

## 2020-01-13 MED ORDER — LACTATED RINGERS IV SOLN: INTRAVENOUS | Status: DC

## 2020-01-13 MED ORDER — SOD CITRATE-CITRIC ACID 500-334 MG/5ML PO SOLN: 30.0000 mL | ORAL | Status: DC | PRN

## 2020-01-13 MED ORDER — OXYCODONE-ACETAMINOPHEN 5-325 MG PO TABS: 1.0000 | ORAL_TABLET | ORAL | Status: DC | PRN

## 2020-01-13 MED ORDER — PHENYLEPHRINE 40 MCG/ML (10ML) SYRINGE FOR IV PUSH (FOR BLOOD PRESSURE SUPPORT): 80.0000 ug | PREFILLED_SYRINGE | INTRAVENOUS | Status: DC | PRN

## 2020-01-13 MED ORDER — TERBUTALINE SULFATE 1 MG/ML IJ SOLN: 0.2500 mg | Freq: Once | INTRAMUSCULAR | Status: DC | PRN

## 2020-01-13 MED ORDER — LIDOCAINE HCL (PF) 1 % IJ SOLN
30.0000 mL | INTRAMUSCULAR | Status: DC | PRN
  Filled 2020-01-13: qty 30

## 2020-01-13 MED ORDER — OXYTOCIN 40 UNITS IN NORMAL SALINE INFUSION - SIMPLE MED
2.5000 [IU]/h | INTRAVENOUS | Status: DC
  Filled 2020-01-13: qty 1000

## 2020-01-13 MED ORDER — PHENYLEPHRINE 40 MCG/ML (10ML) SYRINGE FOR IV PUSH (FOR BLOOD PRESSURE SUPPORT)
80.0000 ug | PREFILLED_SYRINGE | INTRAVENOUS | Status: DC | PRN
  Filled 2020-01-13: qty 10

## 2020-01-13 MED ORDER — FENTANYL-BUPIVACAINE-NACL 0.5-0.125-0.9 MG/250ML-% EP SOLN
12.0000 mL/h | EPIDURAL | Status: DC | PRN
  Filled 2020-01-13: qty 250

## 2020-01-13 MED ORDER — LACTATED RINGERS IV SOLN
500.0000 mL | Freq: Once | INTRAVENOUS | Status: AC
  Administered 2020-01-13: 20:00:00 500 mL via INTRAVENOUS

## 2020-01-13 MED ORDER — SODIUM CHLORIDE (PF) 0.9 % IJ SOLN
INTRAMUSCULAR | Status: DC | PRN
  Administered 2020-01-13: 12 mL/h via EPIDURAL

## 2020-01-13 MED ORDER — LIDOCAINE HCL (PF) 1 % IJ SOLN
INTRAMUSCULAR | Status: DC | PRN
  Administered 2020-01-13: 5 mL via EPIDURAL

## 2020-01-13 MED ORDER — LACTATED RINGERS IV SOLN: 500.0000 mL | INTRAVENOUS | Status: DC | PRN

## 2020-01-13 MED ORDER — ONDANSETRON HCL 4 MG/2ML IJ SOLN: 4.0000 mg | Freq: Four times a day (QID) | INTRAMUSCULAR | Status: DC | PRN

## 2020-01-13 MED ORDER — ACETAMINOPHEN 325 MG PO TABS: 650.0000 mg | ORAL_TABLET | ORAL | Status: DC | PRN

## 2020-01-13 MED ORDER — OXYCODONE-ACETAMINOPHEN 5-325 MG PO TABS: 2.0000 | ORAL_TABLET | ORAL | Status: DC | PRN

## 2020-01-13 MED ORDER — EPHEDRINE 5 MG/ML INJ: 10.0000 mg | INTRAVENOUS | Status: DC | PRN

## 2020-01-13 MED ORDER — OXYTOCIN BOLUS FROM INFUSION: 500.0000 mL | Freq: Once | INTRAVENOUS | Status: DC

## 2020-01-13 MED ORDER — OXYTOCIN 40 UNITS IN NORMAL SALINE INFUSION - SIMPLE MED
1.0000 m[IU]/min | INTRAVENOUS | Status: DC
  Administered 2020-01-13: 19:00:00 2 m[IU]/min via INTRAVENOUS

## 2020-01-13 MED ORDER — DIPHENHYDRAMINE HCL 50 MG/ML IJ SOLN: 12.5000 mg | INTRAMUSCULAR | Status: DC | PRN

## 2020-01-13 NOTE — Patient Instructions (Signed)

## 2020-01-13 NOTE — Anesthesia Procedure Notes (Signed)
Epidural Patient location during procedure: OB Start time: 01/13/2020 8:17 PM End time: 01/13/2020 8:31 PM  Staffing Anesthesiologist: Trevor Iha, MD Performed: anesthesiologist   Preanesthetic Checklist Completed: patient identified, IV checked, site marked, risks and benefits discussed, surgical consent, monitors and equipment checked, pre-op evaluation and timeout performed  Epidural Patient position: sitting Prep: DuraPrep and site prepped and draped Patient monitoring: continuous pulse ox and blood pressure Approach: midline Location: L3-L4 Injection technique: LOR air  Needle:  Needle type: Tuohy  Needle gauge: 17 G Needle length: 9 cm and 9 Needle insertion depth: 9 cm Catheter type: closed end flexible Catheter size: 19 Gauge Catheter at skin depth: 14 cm Test dose: negative  Assessment Events: blood not aspirated, injection not painful, no injection resistance, no paresthesia and negative IV test  Additional Notes Patient identified. Risks/Benefits/Options discussed with patient including but not limited to bleeding, infection, nerve damage, paralysis, failed block, incomplete pain control, headache, blood pressure changes, nausea, vomiting, reactions to medication both or allergic, itching and postpartum back pain. Confirmed with bedside nurse the patient's most recent platelet count. Confirmed with patient that they are not currently taking any anticoagulation, have any bleeding history or any family history of bleeding disorders. Patient expressed understanding and wished to proceed. All questions were answered. Sterile technique was used throughout the entire procedure. Please see nursing notes for vital signs. Test dose was given through epidural needle and negative prior to continuing to dose epidural or start infusion. Warning signs of high block given to the patient including shortness of breath, tingling/numbness in hands, complete motor block, or any  concerning symptoms with instructions to call for help. Patient was given instructions on fall risk and not to get out of bed. All questions and concerns addressed with instructions to call with any issues.  1 Attempt (S) . Patient tolerated procedure well.

## 2020-01-13 NOTE — H&P (Addendum)
OBSTETRIC ADMISSION HISTORY AND PHYSICAL  Danielle Morgan is a 24 y.o. female G3P1011 with IUP at [redacted]w[redacted]d by  LMP presenting for IOL for gHTN. She reports +FMs, No LOF, no VB, no blurry vision, headaches or peripheral edema, and RUQ pain.  She plans on breast feeding. She requests outpatient IUD for birth control. She received her prenatal care at [facility]    Dating: By LMP --->  Estimated Date of Delivery: 01/25/20  Sono:   @[redacted]w[redacted]d , CWD, normal anatomy, variable presentation, EFW: 596  gm  (1 lb 5 oz) 36  %lie  Prenatal History/Complications:  Gestational hypertension    Past Medical History: Past Medical History:  Diagnosis Date  . Frequent headaches     Past Surgical History: Past Surgical History:  Procedure Laterality Date  . NO PAST SURGERIES      Obstetrical History: OB History    Gravida  3   Para  1   Term  1   Preterm      AB  1   Living  1     SAB  1   TAB      Ectopic      Multiple  0   Live Births  1           Social History Social History   Socioeconomic History  . Marital status: Married    Spouse name: Not on file  . Number of children: Not on file  . Years of education: Not on file  . Highest education level: Not on file  Occupational History  . Not on file  Tobacco Use  . Smoking status: Never Smoker  . Smokeless tobacco: Never Used  Substance and Sexual Activity  . Alcohol use: No    Alcohol/week: 0.0 standard drinks  . Drug use: No  . Sexual activity: Yes    Partners: Male    Birth control/protection: None  Other Topics Concern  . Not on file  Social History Narrative  . Not on file   Social Determinants of Health   Financial Resource Strain:   . Difficulty of Paying Living Expenses:   Food Insecurity:   . Worried About Charity fundraiser in the Last Year:   . Arboriculturist in the Last Year:   Transportation Needs:   . Film/video editor (Medical):   Marland Kitchen Lack of Transportation (Non-Medical):   Physical  Activity:   . Days of Exercise per Week:   . Minutes of Exercise per Session:   Stress:   . Feeling of Stress :   Social Connections:   . Frequency of Communication with Friends and Family:   . Frequency of Social Gatherings with Friends and Family:   . Attends Religious Services:   . Active Member of Clubs or Organizations:   . Attends Archivist Meetings:   Marland Kitchen Marital Status:     Family History: Family History  Problem Relation Age of Onset  . Arthritis Mother   . Bipolar disorder Father   . Cancer Paternal Grandmother        Lung  . ADD / ADHD Sister   . ADD / ADHD Brother   . Heart disease Maternal Uncle   . Arthritis Maternal Grandfather   . Hypertension Maternal Grandfather   . Alcohol abuse Maternal Grandfather     Allergies: No Known Allergies  Medications Prior to Admission  Medication Sig Dispense Refill Last Dose  . Butalbital-APAP-Caffeine 50-325-40 MG capsule Take 1-2 capsules  by mouth every 6 (six) hours as needed for headache. (Patient not taking: Reported on 12/01/2019) 30 capsule 1   . cyclobenzaprine (FLEXERIL) 10 MG tablet Take 1 tablet (10 mg total) by mouth every 8 (eight) hours as needed for muscle spasms. (Patient not taking: Reported on 09/03/2019) 30 tablet 3   . Magnesium Malate 1250 (141.7 Mg) MG TABS Take 1 tablet by mouth daily as needed (headache). (Patient not taking: Reported on 12/30/2019)     . nitrofurantoin, macrocrystal-monohydrate, (MACROBID) 100 MG capsule Take 1 capsule (100 mg total) by mouth 2 (two) times daily. (Patient not taking: Reported on 10/28/2019) 14 capsule 1   . Prenatal Vit-Fe Fumarate-FA (MULTIVITAMIN-PRENATAL) 27-0.8 MG TABS tablet Take 1 tablet by mouth daily at 12 noon.     . promethazine (PHENERGAN) 12.5 MG tablet Take 1 tablet (12.5 mg total) by mouth every 6 (six) hours as needed for nausea or vomiting. (Patient not taking: Reported on 09/03/2019) 30 tablet 0   . terconazole (TERAZOL 7) 0.4 % vaginal cream  Place 1 applicator vaginally at bedtime. Use for seven days (Patient not taking: Reported on 12/30/2019) 45 g 0      Review of Systems   All systems reviewed and negative except as stated in HPI  Blood pressure (!) 145/89, pulse 95, temperature 98.6 F (37 C), height 5\' 5"  (1.651 m), weight 96.6 kg, last menstrual period 04/20/2019. General appearance: alert and no distress Lungs: clear to auscultation bilaterally Heart: regular rate and rhythm Abdomen: soft, non-tender; bowel sounds normal Pelvic: gravid uterus Extremities: Homans sign is negative, no sign of DVT Presentation: cephalic per RN exam Fetal monitoringBaseline: 130 bpm, Variability: Good {> 6 bpm), Accelerations: Reactive and Decelerations: Absent Uterine activityFrequency: Every 1-3 minutes     Prenatal labs: ABO, Rh: A/Positive/-- (10/20 1535) Antibody: Negative (10/20 1535) Rubella: 1.85 (10/20 1535) RPR: Non Reactive (02/10 0823)  HBsAg: Negative (10/20 1535)  HIV: Non Reactive (02/10 0823)  GBS: Negative/-- (04/14 1614)  2 hr Glucola (78, 117, 95) Genetic screening  declined Anatomy 02-06-1992 WNL  Prenatal Transfer Tool  Maternal Diabetes: No Genetic Screening: Declined Maternal Ultrasounds/Referrals: Normal Fetal Ultrasounds or other Referrals:  None Maternal Substance Abuse:  No Significant Maternal Medications:  None Significant Maternal Lab Results: Group B Strep negative  No results found for this or any previous visit (from the past 24 hour(s)).  Patient Active Problem List   Diagnosis Date Noted  . Gestational hypertension 01/13/2020  . Normal labor 01/13/2020  . Migraines 12/16/2017  . Supervision of other normal pregnancy, antepartum 04/26/2015    Assessment/Plan:  Danielle Morgan is a 24 y.o. G3P1011 at [redacted]w[redacted]d here for for IOL for PIH.   #Labor: Admit to L&D. S/p Foley bulb placed in office which come out. Expectant management. Discussed risks and benefits of labor induction and patient  agreeable.  #Pain: Per patient request  #FWB: Cat 1 #ID:  GBS negative #MOF: breast #MOC:IUD at pp follow up visit #Circ:  Yes, outpatient  #COVID: swab pending  #gHTN: Hypertensive on arrival (140/89) and in clinic today on >2 occassions. Pre-E labs ordered and pending. Asymptomatic.   [redacted]w[redacted]d, DO  01/13/2020, 6:29 PM

## 2020-01-13 NOTE — Progress Notes (Signed)
Foley bulb placed in office expelled

## 2020-01-13 NOTE — Progress Notes (Signed)
Labor Progress Note Airica Schwartzkopf is a 24 y.o. G3P1011 at [redacted]w[redacted]d presented for IOL for gHTN  S: Pain much better since epidural. L leg now numb after laying on L side.   O:  BP 128/71   Pulse 95   Temp 98.5 F (36.9 C) (Oral)   Resp 16   Ht 5\' 5"  (1.651 m)   Wt 96.6 kg   LMP 04/20/2019 (Approximate)   SpO2 96%   BMI 35.45 kg/m  EFM: 125bpm, high variability, no accels, two early decels TOCO: Q1-2 min  CVE: Dilation: 6 Effacement (%): 70 Cervical Position: Posterior Station: -1 Presentation: Vertex Exam by:: m wilkins rnc   A&P: 24 y.o. 25 [redacted]w[redacted]d for IOL for gHTN  #Labor: s/p FB, pit started at 60. Still on 2 milli-units/min, will increase to 4 as baby tolerates. Consider AROM at next check. #Pain: s/p epidural #FWB: Cat 1, EFW 36th%ile #GBS negative #gHTN: Hypertensive on arrival (140/89) and BP trending between 135-143 systolic and 84-102 diastolic.  #covid swab: Negative result since last progress note  200, DO 10:12 PM

## 2020-01-13 NOTE — Progress Notes (Signed)
Pt denies headache or visual changes 

## 2020-01-13 NOTE — Anesthesia Preprocedure Evaluation (Signed)
Anesthesia Evaluation  Patient identified by MRN, date of birth, ID band Patient awake    Reviewed: Allergy & Precautions, NPO status , Patient's Chart, lab work & pertinent test results  Airway Mallampati: II  TM Distance: >3 FB Neck ROM: Full    Dental no notable dental hx. (+) Teeth Intact   Pulmonary neg pulmonary ROS,    Pulmonary exam normal breath sounds clear to auscultation       Cardiovascular hypertension, Normal cardiovascular exam Rhythm:Regular Rate:Normal     Neuro/Psych  Headaches, negative neurological ROS  negative psych ROS   GI/Hepatic negative GI ROS, Neg liver ROS,   Endo/Other  negative endocrine ROS  Renal/GU negative Renal ROS     Musculoskeletal negative musculoskeletal ROS (+)   Abdominal (+) + obese,   Peds  Hematology Lab Results      Component                Value               Date                      WBC                      10.8 (H)            01/13/2020                HGB                      10.9 (L)            01/13/2020                HCT                      34.6 (L)            01/13/2020                MCV                      84.8                01/13/2020                PLT                      269                 01/13/2020             Anesthesia Other Findings   Reproductive/Obstetrics (+) Pregnancy                             Anesthesia Physical Anesthesia Plan  ASA: III  Anesthesia Plan: Epidural   Post-op Pain Management:    Induction:   PONV Risk Score and Plan:   Airway Management Planned:   Additional Equipment:   Intra-op Plan:   Post-operative Plan:   Informed Consent: I have reviewed the patients History and Physical, chart, labs and discussed the procedure including the risks, benefits and alternatives for the proposed anesthesia with the patient or authorized representative who has indicated his/her understanding and  acceptance.       Plan Discussed with:   Anesthesia Plan Comments: (38.2 Wk G3P1 w hx  of gHtn  For LEA)        Anesthesia Quick Evaluation

## 2020-01-13 NOTE — MAU Note (Signed)
Denies symptoms or sick contacts. Swab collected 

## 2020-01-13 NOTE — Progress Notes (Signed)
   PRENATAL VISIT NOTE  Subjective:  Danielle Morgan is a 24 y.o. G3P1011 at [redacted]w[redacted]d being seen today for ongoing prenatal care.  She is currently monitored for the following issues for this low-risk pregnancy and has Supervision of other normal pregnancy, antepartum; Migraines; and Gestational hypertension on their problem list.  Patient reports no complaints. She denies headache, visual disturbances, RUQ/epigastric pain, new onset swelling.  Contractions: Irritability. Vag. Bleeding: None.  Movement: Present. Denies leaking of fluid.   The following portions of the patient's history were reviewed and updated as appropriate: allergies, current medications, past family history, past medical history, past social history, past surgical history and problem list. Problem list updated.  Objective:   Vitals:   01/13/20 1423 01/13/20 1556  BP: (!) 149/103 (!) 144/84  Pulse: 92   Weight: 212 lb (96.2 kg)     Fetal Status: Fetal Heart Rate (bpm): 134/NST   Movement: Present  Presentation: Vertex  General:  Alert, oriented and cooperative. Patient is in no acute distress.  Skin: Skin is warm and dry. No rash noted.   Cardiovascular: Normal heart rate noted  Respiratory: Normal respiratory effort, no problems with respiration noted  Abdomen: Soft, gravid, appropriate for gestational age.  Pain/Pressure: Present     Pelvic: Cervical exam performed Dilation: 1 Effacement (%): Thick Station: -3  Extremities: Normal range of motion.  Edema: None  Mental Status: Normal mood and affect. Normal behavior. Normal judgment and thought content.   Assessment and Plan:  Pregnancy: G3P1011 at [redacted]w[redacted]d  1. Supervision of other normal pregnancy, antepartum   2. Gestational hypertension, third trimester --New onset elevated blood pressure previous visit, normal on recheck and normal at work the next morning --Second elevated blood pressure in clinic today, not severe range, no severe symptoms --Discussed with  Dr. Alvester Morin Faculty Attending, agrees with my recommendation for direct admission, IOL --Report called to Dr. Crissie Reese, L&D team --Reactive NST in clinic, foley balloon placed, inflated to 42mL, reactive NST following placement  Term labor symptoms and general obstetric precautions including but not limited to vaginal bleeding, contractions, leaking of fluid and fetal movement were reviewed in detail with the patient. Please refer to After Visit Summary for other counseling recommendations.   Return to clinic in 4-5 weeks for postpartum  Calvert Cantor, CNM

## 2020-01-14 ENCOUNTER — Encounter: Payer: PRIVATE HEALTH INSURANCE | Admitting: Internal Medicine

## 2020-01-14 DIAGNOSIS — Z3A38 38 weeks gestation of pregnancy: Secondary | ICD-10-CM

## 2020-01-14 DIAGNOSIS — O134 Gestational [pregnancy-induced] hypertension without significant proteinuria, complicating childbirth: Secondary | ICD-10-CM | POA: Diagnosis not present

## 2020-01-14 LAB — CBC
HCT: 34 % — ABNORMAL LOW (ref 36.0–46.0)
Hemoglobin: 10.5 g/dL — ABNORMAL LOW (ref 12.0–15.0)
MCH: 26.1 pg (ref 26.0–34.0)
MCHC: 30.9 g/dL (ref 30.0–36.0)
MCV: 84.6 fL (ref 80.0–100.0)
Platelets: 236 10*3/uL (ref 150–400)
RBC: 4.02 MIL/uL (ref 3.87–5.11)
RDW: 14.1 % (ref 11.5–15.5)
WBC: 13.8 10*3/uL — ABNORMAL HIGH (ref 4.0–10.5)
nRBC: 0 % (ref 0.0–0.2)

## 2020-01-14 LAB — RPR: RPR Ser Ql: NONREACTIVE

## 2020-01-14 LAB — PROTEIN / CREATININE RATIO, URINE
Creatinine, Urine: 174.73 mg/dL
Protein Creatinine Ratio: 0.15 mg/mg{Cre} (ref 0.00–0.15)
Total Protein, Urine: 26 mg/dL

## 2020-01-14 MED ORDER — OXYTOCIN 10 UNIT/ML IJ SOLN
INTRAMUSCULAR | Status: AC
  Administered 2020-01-14: 04:00:00 10 [IU] via INTRAMUSCULAR
  Filled 2020-01-14: qty 1

## 2020-01-14 MED ORDER — ONDANSETRON HCL 4 MG/2ML IJ SOLN: 4.0000 mg | INTRAMUSCULAR | Status: DC | PRN

## 2020-01-14 MED ORDER — DIPHENHYDRAMINE HCL 25 MG PO CAPS: 25.0000 mg | ORAL_CAPSULE | Freq: Four times a day (QID) | ORAL | Status: DC | PRN

## 2020-01-14 MED ORDER — SIMETHICONE 80 MG PO CHEW: 80.0000 mg | CHEWABLE_TABLET | ORAL | Status: DC | PRN

## 2020-01-14 MED ORDER — BENZOCAINE-MENTHOL 20-0.5 % EX AERO
1.0000 "application " | INHALATION_SPRAY | CUTANEOUS | Status: DC | PRN
  Administered 2020-01-14: 1 via TOPICAL
  Filled 2020-01-14 (×2): qty 56

## 2020-01-14 MED ORDER — TETANUS-DIPHTH-ACELL PERTUSSIS 5-2.5-18.5 LF-MCG/0.5 IM SUSP: 0.5000 mL | Freq: Once | INTRAMUSCULAR | Status: DC

## 2020-01-14 MED ORDER — SENNOSIDES-DOCUSATE SODIUM 8.6-50 MG PO TABS
2.0000 | ORAL_TABLET | ORAL | Status: DC
  Administered 2020-01-14: 2 via ORAL
  Filled 2020-01-14: qty 2

## 2020-01-14 MED ORDER — OXYTOCIN 10 UNIT/ML IJ SOLN: 10.0000 [IU] | Freq: Once | INTRAMUSCULAR | Status: AC

## 2020-01-14 MED ORDER — IBUPROFEN 600 MG PO TABS
600.0000 mg | ORAL_TABLET | Freq: Four times a day (QID) | ORAL | Status: DC
  Administered 2020-01-14 – 2020-01-15 (×6): 600 mg via ORAL
  Filled 2020-01-14 (×6): qty 1

## 2020-01-14 MED ORDER — WITCH HAZEL-GLYCERIN EX PADS: 1.0000 "application " | MEDICATED_PAD | CUTANEOUS | Status: DC | PRN

## 2020-01-14 MED ORDER — PRENATAL MULTIVITAMIN CH
1.0000 | ORAL_TABLET | Freq: Every day | ORAL | Status: DC
  Administered 2020-01-14 – 2020-01-15 (×2): 1 via ORAL
  Filled 2020-01-14 (×2): qty 1

## 2020-01-14 MED ORDER — DIBUCAINE (PERIANAL) 1 % EX OINT: 1.0000 "application " | TOPICAL_OINTMENT | CUTANEOUS | Status: DC | PRN

## 2020-01-14 MED ORDER — ONDANSETRON HCL 4 MG PO TABS: 4.0000 mg | ORAL_TABLET | ORAL | Status: DC | PRN

## 2020-01-14 MED ORDER — COCONUT OIL OIL
1.0000 "application " | TOPICAL_OIL | Status: DC | PRN
  Administered 2020-01-15: 1 via TOPICAL

## 2020-01-14 MED ORDER — ZOLPIDEM TARTRATE 5 MG PO TABS: 5.0000 mg | ORAL_TABLET | Freq: Every evening | ORAL | Status: DC | PRN

## 2020-01-14 MED ORDER — ACETAMINOPHEN 325 MG PO TABS
650.0000 mg | ORAL_TABLET | ORAL | Status: DC | PRN
  Administered 2020-01-14 (×2): 650 mg via ORAL
  Filled 2020-01-14 (×2): qty 2

## 2020-01-14 NOTE — Anesthesia Postprocedure Evaluation (Signed)
Anesthesia Post Note  Patient: Vita Currin  Procedure(s) Performed: AN AD HOC LABOR EPIDURAL     Patient location during evaluation: Mother Baby Anesthesia Type: Epidural Level of consciousness: awake and alert Pain management: pain level controlled Vital Signs Assessment: post-procedure vital signs reviewed and stable Respiratory status: spontaneous breathing Cardiovascular status: stable Postop Assessment: no headache, adequate PO intake, no backache, patient able to bend at knees, able to ambulate, epidural receding and no apparent nausea or vomiting Anesthetic complications: no    Last Vitals:  Vitals:   01/14/20 0518 01/14/20 0617  BP: 129/82 138/78  Pulse: (!) 107 83  Resp: 16 18  Temp:    SpO2:      Last Pain:  Vitals:   01/14/20 0617  TempSrc:   PainSc: 0-No pain   Pain Goal:                   Salome Arnt

## 2020-01-14 NOTE — Discharge Summary (Addendum)
Postpartum Discharge Summary     Patient Name: Danielle Morgan DOB: 10-26-1995 MRN: [id number]  Date of admission: 01/13/2020 Delivering Provider: Chauncey Mann   Date of discharge: 01/15/2020  Admitting diagnosis: Normal labor [O80, Z37.9] Intrauterine pregnancy: [redacted]w[redacted]d    Secondary diagnosis:  Active Problems:   Supervision of other normal pregnancy, antepartum   Gestational hypertension   Normal labor  Additional problems: None     Discharge diagnosis: Term Pregnancy Delivered and Gestational Hypertension                                                                                                Post partum procedures: None  Augmentation: AROM, Pitocin and Foley Balloon  Complications: None  Hospital course:  Induction of Labor With Vaginal Delivery   24y.o. yo G3P1011 at 321w3das admitted to the hospital 01/13/2020 for induction of labor.  Indication for induction: Gestational hypertension.  Patient had an uncomplicated labor course as follows: Initial SVE: S/p foley bulb placed in office which came out. At 4 cm on admission. Received epidural. Pitocin started at 1836. AROM at 0110 hrs with clear fluid. She then progressed to complete.  Membrane Rupture Time/Date: 1:10 AM ,01/14/2020   Intrapartum Procedures: Episiotomy: None [1]                                         Lacerations:  Labial [10]  Patient had delivery of a Viable infant.  Information for the patient's newborn:  ShJermiah, Howton0[[id number]]Delivery Method: Vaginal, Spontaneous(Filed from Delivery Summary)    01/14/2020  Details of delivery can be found in separate delivery note. BP's monitored and WNL. Patient had a routine postpartum course. Patient is discharged home 01/15/20. Delivery time: 4:16 AM    Magnesium Sulfate received: No BMZ received: No Rhophylac:No MMR:No Transfusion:No  Physical exam  Vitals:   01/14/20 1130 01/14/20 1553 01/14/20 2017 01/15/20 0519  BP: 137/87 123/69 135/84  (!) 138/94  Pulse: 83 66 69 65  Resp: 18 18 16    Temp: 97.9 F (36.6 C) 97.9 F (36.6 C) 97.8 F (36.6 C)   TempSrc: Oral Oral Oral   SpO2:   99% 98%  Weight:      Height:       General: alert, cooperative and no distress Lochia: appropriate Uterine Fundus: firm Incision: N/A DVT Evaluation: No evidence of DVT seen on physical exam. Labs: Lab Results  Component Value Date   WBC 13.8 (H) 01/14/2020   HGB 10.5 (L) 01/14/2020   HCT 34.0 (L) 01/14/2020   MCV 84.6 01/14/2020   PLT 236 01/14/2020   CMP Latest Ref Rng & Units 01/13/2020  Glucose 70 - 99 mg/dL 73  BUN 6 - 20 mg/dL 8  Creatinine 0.44 - 1.00 mg/dL 0.49  Sodium 135 - 145 mmol/L 137  Potassium 3.5 - 5.1 mmol/L 4.5  Chloride 98 - 111 mmol/L 107  CO2 22 - 32 mmol/L 19(L)  Calcium 8.9 - 10.3 mg/dL  9.0  Total Protein 6.5 - 8.1 g/dL 5.9(L)  Total Bilirubin 0.3 - 1.2 mg/dL 0.4  Alkaline Phos 38 - 126 U/L 148(H)  AST 15 - 41 U/L 23  ALT 0 - 44 U/L 16   Edinburgh Score: Edinburgh Postnatal Depression Scale Screening Tool 01/14/2020  I have been able to laugh and see the funny side of things. 0  I have looked forward with enjoyment to things. 0  I have blamed myself unnecessarily when things went wrong. 0  I have been anxious or worried for no good reason. 0  I have felt scared or panicky for no good reason. 0  Things have been getting on top of me. 0  I have been so unhappy that I have had difficulty sleeping. 0  I have felt sad or miserable. 0  I have been so unhappy that I have been crying. 0  The thought of harming myself has occurred to me. 0  Edinburgh Postnatal Depression Scale Total 0    Discharge instruction: per After Visit Summary and "Baby and Me Booklet".  After visit meds:  Allergies as of 01/15/2020   No Known Allergies      Medication List     TAKE these medications    cyclobenzaprine 10 MG tablet Commonly known as: FLEXERIL Take 1 tablet (10 mg total) by mouth every 8 (eight) hours as  needed for muscle spasms. What changed: additional instructions   multivitamin-prenatal 27-0.8 MG Tabs tablet Take 1 tablet by mouth daily.        Diet: routine diet  Activity: Advance as tolerated. Pelvic rest for 6 weeks.   Outpatient follow up:4 weeks Follow up Appt: Future Appointments  Date Time Provider [place]  02/10/2020  1:30 PM Darlina Rumpf, CNM [facility]   Follow up Visit:     Please schedule this patient for Postpartum visit in: 4 weeks with the following provider: Any provider In-Person For C/S patients schedule nurse incision check in weeks 2 weeks: no Low risk pregnancy complicated by: HTN Delivery mode:  SVD Anticipated Birth Control:  IUD PP Procedures needed:  BP check and IUD placement   Schedule Integrated BH visit: no     Newborn Data: Live born female  Birth Weight: 7 lb 12.5 oz (3530 g) APGAR: 7, 8  Newborn Delivery   Birth date/time: 01/14/2020 04:16:00 Delivery type: Vaginal, Spontaneous      Baby Feeding: Breast Disposition:home with mother   01/15/2020 Alroy Bailiff, DO   I personally saw and evaluated the patient, performing the key elements of the service. I developed and verified the management plan that is described in the resident's/student's note, and I agree with the content with my edits above. VSS, HRR&R, Resp unlabored, Legs neg.  Nigel Berthold, CNM 01/18/2020 8:51 AM

## 2020-01-14 NOTE — Progress Notes (Signed)
Labor Progress Note Danielle Morgan is a 24 y.o. G3P1011 at [redacted]w[redacted]d presented for IOL for gHTN  S: Denies feeling contractions  O:  BP (!) 114/58   Pulse 94   Temp 98.5 F (36.9 C) (Oral)   Resp 16   Ht 5\' 5"  (1.651 m)   Wt 96.6 kg   LMP 04/20/2019 (Approximate)   SpO2 96%   BMI 35.45 kg/m  FHT: 145 bpm, moderate variability, accels present, no decels TOCO: contractions Q1-3 min  CVE: Dilation: 6 Effacement (%): 70 Cervical Position: Posterior Station: -2 Presentation: Vertex Exam by:: Dr. [email or web address]   A&P: 24 y.o. 25 [redacted]w[redacted]d presented for IOL for gHTN #Labor: s/p FB, pitocin started at 1836, cont pit. S/p AROM. Anticipate vaginal delivery. #Pain: s/p epidural #FWB: Cat 1, EFW 36th%ile #GBS negative #gHTN: Hypertensive on arrival (140/89) but BP WNL over last 4 hours.  [redacted]w[redacted]d, DO 1:17 AM

## 2020-01-15 ENCOUNTER — Encounter (HOSPITAL_COMMUNITY): Payer: Self-pay

## 2020-01-15 MED ORDER — ENALAPRIL MALEATE 5 MG PO TABS
5.0000 mg | ORAL_TABLET | Freq: Every day | ORAL | Status: DC
  Administered 2020-01-15: 5 mg via ORAL
  Filled 2020-01-15: qty 1

## 2020-01-15 MED ORDER — ENALAPRIL MALEATE 5 MG PO TABS: 5.0000 mg | ORAL_TABLET | Freq: Every day | ORAL | 2 refills | Status: DC

## 2020-01-15 MED FILL — ENALAPRIL MALEATE 5 MG TABS: 5 | 30 days supply | Qty: 30 | Fill #0

## 2020-01-15 NOTE — Lactation Note (Signed)
This note was copied from a baby's chart. Lactation Consultation Note  Patient Name: Danielle Morgan Today's Date: 01/15/2020  P1, 22 hour ETI female infant. LC entered the room mom and infant asleep.   Maternal Data    Feeding    LATCH Score                   Interventions    Lactation Tools Discussed/Used     Consult Status      [name] 01/15/2020, 2:34 AM

## 2020-01-15 NOTE — Discharge Instructions (Signed)
Postpartum Care After Vaginal Delivery This sheet gives you information about how to care for yourself from the time you deliver your baby to up to 6-12 weeks after delivery (postpartum period). Your health care provider may also give you more specific instructions. If you have problems or questions, contact your health care provider. Follow these instructions at home: Vaginal bleeding  It is normal to have vaginal bleeding (lochia) after delivery. Wear a sanitary pad for vaginal bleeding and discharge. ? During the first week after delivery, the amount and appearance of lochia is often similar to a menstrual period. ? Over the next few weeks, it will gradually decrease to a dry, yellow-brown discharge. ? For most women, lochia stops completely by 4-6 weeks after delivery. Vaginal bleeding can vary from woman to woman.  Change your sanitary pads frequently. Watch for any changes in your flow, such as: ? A sudden increase in volume. ? A change in color. ? Large blood clots.  If you pass a blood clot from your vagina, save it and call your health care provider to discuss. Do not flush blood clots down the toilet before talking with your health care provider.  Do not use tampons or douches until your health care provider says this is safe.  If you are not breastfeeding, your period should return 6-8 weeks after delivery. If you are feeding your child breast milk only (exclusive breastfeeding), your period may not return until you stop breastfeeding. Perineal care  Keep the area between the vagina and the anus (perineum) clean and dry as told by your health care provider. Use medicated pads and pain-relieving sprays and creams as directed.  If you had a cut in the perineum (episiotomy) or a tear in the vagina, check the area for signs of infection until you are healed. Check for: ? More redness, swelling, or pain. ? Fluid or blood coming from the cut or tear. ? Warmth. ? Pus or a bad  smell.  You may be given a squirt bottle to use instead of wiping to clean the perineum area after you go to the bathroom. As you start healing, you may use the squirt bottle before wiping yourself. Make sure to wipe gently.  To relieve pain caused by an episiotomy, a tear in the vagina, or swollen veins in the anus (hemorrhoids), try taking a warm sitz bath 2-3 times a day. A sitz bath is a warm water bath that is taken while you are sitting down. The water should only come up to your hips and should cover your buttocks. Breast care  Within the first few days after delivery, your breasts may feel heavy, full, and uncomfortable (breast engorgement). Milk may also leak from your breasts. Your health care provider can suggest ways to help relieve the discomfort. Breast engorgement should go away within a few days.  If you are breastfeeding: ? Wear a bra that supports your breasts and fits you well. ? Keep your nipples clean and dry. Apply creams and ointments as told by your health care provider. ? You may need to use breast pads to absorb milk that leaks from your breasts. ? You may have uterine contractions every time you breastfeed for up to several weeks after delivery. Uterine contractions help your uterus return to its normal size. ? If you have any problems with breastfeeding, work with your health care provider or lactation consultant.  If you are not breastfeeding: ? Avoid touching your breasts a lot. Doing this can make   your breasts produce more milk. ? Wear a good-fitting bra and use cold packs to help with swelling. ? Do not squeeze out (express) milk. This causes you to make more milk. Intimacy and sexuality  Ask your health care provider when you can engage in sexual activity. This may depend on: ? Your risk of infection. ? How fast you are healing. ? Your comfort and desire to engage in sexual activity.  You are able to get pregnant after delivery, even if you have not had  your period. If desired, talk with your health care provider about methods of birth control (contraception). Medicines  Take over-the-counter and prescription medicines only as told by your health care provider.  If you were prescribed an antibiotic medicine, take it as told by your health care provider. Do not stop taking the antibiotic even if you start to feel better. Activity  Gradually return to your normal activities as told by your health care provider. Ask your health care provider what activities are safe for you.  Rest as much as possible. Try to rest or take a nap while your baby is sleeping. Eating and drinking   Drink enough fluid to keep your urine pale yellow.  Eat high-fiber foods every day. These may help prevent or relieve constipation. High-fiber foods include: ? Whole grain cereals and breads. ? Brown rice. ? Beans. ? Fresh fruits and vegetables.  Do not try to lose weight quickly by cutting back on calories.  Take your prenatal vitamins until your postpartum checkup or until your health care provider tells you it is okay to stop. Lifestyle  Do not use any products that contain nicotine or tobacco, such as cigarettes and e-cigarettes. If you need help quitting, ask your health care provider.  Do not drink alcohol, especially if you are breastfeeding. General instructions  Keep all follow-up visits for you and your baby as told by your health care provider. Most women visit their health care provider for a postpartum checkup within the first 3-6 weeks after delivery. Contact a health care provider if:  You feel unable to cope with the changes that your child brings to your life, and these feelings do not go away.  You feel unusually sad or worried.  Your breasts become red, painful, or hard.  You have a fever.  You have trouble holding urine or keeping urine from leaking.  You have little or no interest in activities you used to enjoy.  You have not  breastfed at all and you have not had a menstrual period for 12 weeks after delivery.  You have stopped breastfeeding and you have not had a menstrual period for 12 weeks after you stopped breastfeeding.  You have questions about caring for yourself or your baby.  You pass a blood clot from your vagina. Get help right away if:  You have chest pain.  You have difficulty breathing.  You have sudden, severe leg pain.  You have severe pain or cramping in your lower abdomen.  You bleed from your vagina so much that you fill more than one sanitary pad in one hour. Bleeding should not be heavier than your heaviest period.  You develop a severe headache.  You faint.  You have blurred vision or spots in your vision.  You have bad-smelling vaginal discharge.  You have thoughts about hurting yourself or your baby. If you ever feel like you may hurt yourself or others, or have thoughts about taking your own life, get help   right away. You can go to the nearest emergency department or call:  Your local emergency services (911 in the [place]  A suicide crisis helpline, such as the National Suicide Prevention Lifeline at 1-800-273-8255. This is open 24 hours a day. Summary  The period of time right after you deliver your newborn up to 6-12 weeks after delivery is called the postpartum period.  Gradually return to your normal activities as told by your health care provider.  Keep all follow-up visits for you and your baby as told by your health care provider. This information is not intended to replace advice given to you by your health care provider. Make sure you discuss any questions you have with your health care provider. Document Revised: 09/06/2017 Document Reviewed: 06/17/2017 [employer] Patient Education  2020 [employer] Postpartum Hypertension Postpartum hypertension is high blood pressure that remains higher than normal after childbirth. You may not realize that you have  postpartum hypertension if your blood pressure is not being checked regularly. In most cases, postpartum hypertension will go away on its own, usually within a week of delivery. However, for some women, medical treatment is required to prevent serious complications, such as seizures or stroke. What are the causes? This condition may be caused by one or more of the following:  Hypertension that existed before pregnancy (chronic hypertension).  Hypertension that comes on as a result of pregnancy (gestational hypertension).  Hypertensive disorders during pregnancy (preeclampsia) or seizures in women who have high blood pressure during pregnancy (eclampsia).  A condition in which the liver, platelets, and red blood cells are damaged during pregnancy (HELLP syndrome).  A condition in which the thyroid produces too much hormones (hyperthyroidism).  Other rare problems of the nerves (neurological disorders) or blood disorders. In some cases, the cause may not be known. What increases the risk? The following factors may make you more likely to develop this condition:  Chronic hypertension. In some cases, this may not have been diagnosed before pregnancy.  Obesity.  Type 2 diabetes.  Kidney disease.  History of preeclampsia or eclampsia.  Other medical conditions that change the level of hormones in the body (hormonal imbalance). What are the signs or symptoms? As with all types of hypertension, postpartum hypertension may not have any symptoms. Depending on how high your blood pressure is, you may experience:  Headaches. These may be mild, moderate, or severe. They may also be steady, constant, or sudden in onset (thunderclap headache).  Changes in your ability to see (visual changes).  Dizziness.  Shortness of breath.  Swelling of your hands, feet, lower legs, or face. In some cases, you may have swelling in more than one of these locations.  Heart palpitations or a racing  heartbeat.  Difficulty breathing while lying down.  Decrease in the amount of urine that you pass. Other rare signs and symptoms may include:  Sweating more than usual. This lasts longer than a few days after delivery.  Chest pain.  Sudden dizziness when you get up from sitting or lying down.  Seizures.  Nausea or vomiting.  Abdominal pain. How is this diagnosed? This condition may be diagnosed based on the results of a physical exam, blood pressure measurements, and blood and urine tests. You may also have other tests, such as a CT scan or an MRI, to check for other problems of postpartum hypertension. How is this treated? If blood pressure is high enough to require treatment, your options may include:  Medicines to reduce blood pressure (  antihypertensives). Tell your health care provider if you are breastfeeding or if you plan to breastfeed. There are many antihypertensive medicines that are safe to take while breastfeeding.  Stopping medicines that may be causing hypertension.  Treating medical conditions that are causing hypertension.  Treating the complications of hypertension, such as seizures, stroke, or kidney problems. Your health care provider will also continue to monitor your blood pressure closely until it is within a safe range for you. Follow these instructions at home:  Take over-the-counter and prescription medicines only as told by your health care provider.  Return to your normal activities as told by your health care provider. Ask your health care provider what activities are safe for you.  Do not use any products that contain nicotine or tobacco, such as cigarettes and e-cigarettes. If you need help quitting, ask your health care provider.  Keep all follow-up visits as told by your health care provider. This is important. Contact a health care provider if:  Your symptoms get worse.  You have new symptoms, such as: ? A headache that does not get  better. ? Dizziness. ? Visual changes. Get help right away if:  You suddenly develop swelling in your hands, ankles, or face.  You have sudden, rapid weight gain.  You develop difficulty breathing, chest pain, racing heartbeat, or heart palpitations.  You develop severe pain in your abdomen.  You have any symptoms of a stroke. "BE FAST" is an easy way to remember the main warning signs of a stroke: ? B - Balance. Signs are dizziness, sudden trouble walking, or loss of balance. ? E - Eyes. Signs are trouble seeing or a sudden change in vision. ? F - Face. Signs are sudden weakness or numbness of the face, or the face or eyelid drooping on one side. ? A - Arms. Signs are weakness or numbness in an arm. This happens suddenly and usually on one side of the body. ? S - Speech. Signs are sudden trouble speaking, slurred speech, or trouble understanding what people say. ? T - Time. Time to call emergency services. Write down what time symptoms started.  You have other signs of a stroke, such as: ? A sudden, severe headache with no known cause. ? Nausea or vomiting. ? Seizure. These symptoms may represent a serious problem that is an emergency. Do not wait to see if the symptoms will go away. Get medical help right away. Call your local emergency services (911 in the [place] Do not drive yourself to the hospital. Summary  Postpartum hypertension is high blood pressure that remains higher than normal after childbirth.  In most cases, postpartum hypertension will go away on its own, usually within a week of delivery.  For some women, medical treatment is required to prevent serious complications, such as seizures or stroke. This information is not intended to replace advice given to you by your health care provider. Make sure you discuss any questions you have with your health care provider. Document Revised: 10/10/2018 Document Reviewed: 06/24/2017 Elsevier Patient Education  2020 [employer]  

## 2020-01-15 NOTE — Lactation Note (Signed)
This note was copied from a baby's chart. Lactation Consultation Note  Patient Name: Danielle Morgan [id number] Date: 01/15/2020 Reason for consult: Initial assessment;Early term 37-38.6wks P2, 25 hour female infant. Had 3 voids but not stools yet. Mom had DEBP at home.  Per mom, she feels infant is latching well, most feedings are 20-30 minutes in length and she is only  experiencing a tug, but not pain with latch.  Per mom, she breastfed her 24 year old for 4 months. Per mom, she is experiencing  a little nipple soreness and RN will give her coconut oil to help with her discomfort.  LC entered the room, mom had infant latched on her right breast using the cradle hold, swallows were observe, infant's mouth was wide with nose and chin touching breast. . LC noticed infant was clothed and mom was not using pillow support to support infant's back and body. Mom open to placing pillow under infant and will breastfeed infant at next feeding doing STS. Mom knows to breastfeed infant according to hunger cues, 8 to 12 times within 24 hours and on demand.  Mom knows to call RN or LC if she has any questions, concerns or need assistance with latching infant at breast. Mom made aware of O/P services, breastfeeding support groups, community resources, and our phone # for post-discharge questions.     Maternal Data Formula Feeding for Exclusion: No Has patient been taught Hand Expression?: Yes Does the patient have breastfeeding experience prior to this delivery?: Yes  Feeding Feeding Type: Breast Fed  LATCH Score Latch: Grasps breast easily, tongue down, lips flanged, rhythmical sucking.  Audible Swallowing: Spontaneous and intermittent  Type of Nipple: Everted at rest and after stimulation  Comfort (Breast/Nipple): Soft / non-tender  Hold (Positioning): No assistance needed to correctly position infant at breast.  LATCH Score: 10  Interventions Interventions: Breast feeding basics  reviewed;Skin to skin;Adjust position;DEBP;Hand pump  Lactation Tools Discussed/Used WIC Program: No Pump Review: Setup, frequency, and cleaning;Milk Storage Initiated by:: RN Date initiated:: 01/14/20   Consult Status Consult Status: Follow-up Date: 01/15/20 Follow-up type: In-patient    [name] 01/15/2020, 5:30 AM

## 2020-02-10 ENCOUNTER — Other Ambulatory Visit: Payer: Self-pay

## 2020-02-10 ENCOUNTER — Ambulatory Visit (INDEPENDENT_AMBULATORY_CARE_PROVIDER_SITE_OTHER): Payer: PRIVATE HEALTH INSURANCE | Admitting: Advanced Practice Midwife

## 2020-02-10 ENCOUNTER — Encounter: Payer: Self-pay | Admitting: Internal Medicine

## 2020-02-10 DIAGNOSIS — Z30430 Encounter for insertion of intrauterine contraceptive device: Secondary | ICD-10-CM | POA: Diagnosis not present

## 2020-02-10 DIAGNOSIS — Z975 Presence of (intrauterine) contraceptive device: Secondary | ICD-10-CM | POA: Insufficient documentation

## 2020-02-10 LAB — POCT URINE PREGNANCY: Preg Test, Ur: NEGATIVE

## 2020-02-10 MED ORDER — LEVONORGESTREL 19.5 MCG/DAY IU IUD: INTRAUTERINE_SYSTEM | Freq: Once | INTRAUTERINE | Status: AC

## 2020-02-10 NOTE — Patient Instructions (Signed)
Perinatal Depression When a woman feels excessive sadness, anger, or anxiety during pregnancy or during the first 12 months after she gives birth, she has a condition called perinatal depression. Depression can interfere with work, school, relationships, and other everyday activities. If it is not managed properly, it can also cause problems in the mother and her baby. Sometimes, perinatal depression is left untreated because symptoms are thought to be normal mood swings during and right after pregnancy. If you have symptoms of depression, it is important to talk with your health care provider. What are the causes? The exact cause of this condition is not known. Hormonal changes during and after pregnancy may play a role in causing perinatal depression. What increases the risk? You are more likely to develop this condition if:  You have a personal or family history of depression, anxiety, or mood disorders.  You experience a stressful life event during pregnancy, such as the death of a loved one.  You have a lot of regular life stress.  You do not have support from family members or loved ones, or you are in an abusive relationship. What are the signs or symptoms? Symptoms of this condition include:  Feeling sad or hopeless.  Feelings of guilt.  Feeling irritable or overwhelmed.  Changes in your appetite.  Lack of energy or motivation.  Sleep problems.  Difficulty concentrating or completing tasks.  Loss of interest in hobbies or relationships.  Headaches or stomach problems that do not go away. How is this diagnosed? This condition is diagnosed based on a physical exam and mental evaluation. In some cases, your health care provider may use a depression screening tool. These tools include a list of questions that can help a health care provider diagnose depression. Your health care provider may refer you to a mental health expert who specializes in depression. How is this  treated? This condition may be treated with:  Medicines. Your health care provider will only give you medicines that have been proven safe for pregnancy and breastfeeding.  Talk therapy with a mental health professional to help change your patterns of thinking (cognitive behavioral therapy).  Support groups.  Brain stimulation or light therapies.  Stress reduction therapies, such as mindfulness. Follow these instructions at home: Lifestyle  Do not use any products that contain nicotine or tobacco, such as cigarettes and e-cigarettes. If you need help quitting, ask your health care provider.  Do not use alcohol when you are pregnant. After your baby is born, limit alcohol intake to no more than 1 drink a day. One drink equals 12 oz of beer, 5 oz of wine, or 1 oz of hard liquor.  Consider joining a support group for new mothers. Ask your health care provider for recommendations.  Take good care of yourself. Make sure you: ? Get plenty of sleep. If you are having trouble sleeping, talk with your health care provider. ? Eat a healthy diet. This includes plenty of fruits and vegetables, whole grains, and lean proteins. ? Exercise regularly, as told by your health care provider. Ask your health care provider what exercises are safe for you. General instructions  Take over-the-counter and prescription medicines only as told by your health care provider.  Talk with your partner or family members about your feelings during pregnancy. Share any concerns or anxieties that you may have.  Ask for help with tasks or chores when you need it. Ask friends and family members to provide meals, watch your children, or help  with cleaning.  Keep all follow-up visits as told by your health care provider. This is important. Contact a health care provider if:  You (or people close to you) notice that you have any symptoms of depression.  You have depression and your symptoms get worse.  You  experience side effects from medicines, such as nausea or sleep problems. Get help right away if:  You feel like hurting yourself, your baby, or someone else. If you ever feel like you may hurt yourself or others, or have thoughts about taking your own life, get help right away. You can go to your nearest emergency department or call:  Your local emergency services (911 in the [place]  A suicide crisis helpline, such as the [facility] at (680) 452-0302. This is open 24 hours a day. Summary  Perinatal depression is when a woman feels excessive sadness, anger, or anxiety during pregnancy or during the first 12 months after she gives birth.  If perinatal depression is not treated, it can lead to health problems for the mother and her baby.  This condition is treated with medicines, talk therapy, stress reduction therapies, or a combination of two or more treatments.  Talk with your partner or family members about your feelings. Do not be afraid to ask for help. This information is not intended to replace advice given to you by your health care provider. Make sure you discuss any questions you have with your health care provider. Document Revised: 02/18/2019 Document Reviewed: 10/31/2016 Elsevier Patient Education  2020 [place] 45-34 Years Old, Female Preventive care refers to visits with your health care provider and lifestyle choices that can promote health and wellness. This includes:  A yearly physical exam. This may also be called an annual well check.  Regular dental visits and eye exams.  Immunizations.  Screening for certain conditions.  Healthy lifestyle choices, such as eating a healthy diet, getting regular exercise, not using drugs or products that contain nicotine and tobacco, and limiting alcohol use. What can I expect for my preventive care visit? Physical exam Your health care provider will check your:  Height and  weight. This may be used to calculate body mass index (BMI), which tells if you are at a healthy weight.  Heart rate and blood pressure.  Skin for abnormal spots. Counseling Your health care provider may ask you questions about your:  Alcohol, tobacco, and drug use.  Emotional well-being.  Home and relationship well-being.  Sexual activity.  Eating habits.  Work and work Statistician.  Method of birth control.  Menstrual cycle.  Pregnancy history. What immunizations do I need?  Influenza (flu) vaccine  This is recommended every year. Tetanus, diphtheria, and pertussis (Tdap) vaccine  You may need a Td booster every 10 years. Varicella (chickenpox) vaccine  You may need this if you have not been vaccinated. Human papillomavirus (HPV) vaccine  If recommended by your health care provider, you may need three doses over 6 months. Measles, mumps, and rubella (MMR) vaccine  You may need at least one dose of MMR. You may also need a second dose. Meningococcal conjugate (MenACWY) vaccine  One dose is recommended if you are age 24-21 years and a first-year college student living in a residence hall, or if you have one of several medical conditions. You may also need additional booster doses. Pneumococcal conjugate (PCV13) vaccine  You may need this if you have certain conditions and were not previously vaccinated. Pneumococcal polysaccharide (  PPSV23) vaccine  You may need one or two doses if you smoke cigarettes or if you have certain conditions. Hepatitis A vaccine  You may need this if you have certain conditions or if you travel or work in places where you may be exposed to hepatitis A. Hepatitis B vaccine  You may need this if you have certain conditions or if you travel or work in places where you may be exposed to hepatitis B. Haemophilus influenzae type b (Hib) vaccine  You may need this if you have certain conditions. You may receive vaccines as individual  doses or as more than one vaccine together in one shot (combination vaccines). Talk with your health care provider about the risks and benefits of combination vaccines. What tests do I need?  Blood tests  Lipid and cholesterol levels. These may be checked every 5 years starting at age 26.  Hepatitis C test.  Hepatitis B test. Screening  Diabetes screening. This is done by checking your blood sugar (glucose) after you have not eaten for a while (fasting).  Sexually transmitted disease (STD) testing.  BRCA-related cancer screening. This may be done if you have a family history of breast, ovarian, tubal, or peritoneal cancers.  Pelvic exam and Pap test. This may be done every 3 years starting at age 52. Starting at age 12, this may be done every 5 years if you have a Pap test in combination with an HPV test. Talk with your health care provider about your test results, treatment options, and if necessary, the need for more tests. Follow these instructions at home: Eating and drinking   Eat a diet that includes fresh fruits and vegetables, whole grains, lean protein, and low-fat dairy.  Take vitamin and mineral supplements as recommended by your health care provider.  Do not drink alcohol if: ? Your health care provider tells you not to drink. ? You are pregnant, may be pregnant, or are planning to become pregnant.  If you drink alcohol: ? Limit how much you have to 0-1 drink a day. ? Be aware of how much alcohol is in your drink. In the [place] one drink equals one 12 oz bottle of beer (355 mL), one 5 oz glass of wine (148 mL), or one 1 oz glass of hard liquor (44 mL). Lifestyle  Take daily care of your teeth and gums.  Stay active. Exercise for at least 30 minutes on 5 or more days each week.  Do not use any products that contain nicotine or tobacco, such as cigarettes, e-cigarettes, and chewing tobacco. If you need help quitting, ask your health care provider.  If you are  sexually active, practice safe sex. Use a condom or other form of birth control (contraception) in order to prevent pregnancy and STIs (sexually transmitted infections). If you plan to become pregnant, see your health care provider for a preconception visit. What's next?  Visit your health care provider once a year for a well check visit.  Ask your health care provider how often you should have your eyes and teeth checked.  Stay up to date on all vaccines. This information is not intended to replace advice given to you by your health care provider. Make sure you discuss any questions you have with your health care provider. Document Revised: 05/15/2018 Document Reviewed: 05/15/2018 Elsevier Patient Education  2020 [employer].

## 2020-02-10 NOTE — Progress Notes (Signed)
    Post Partum Visit Note  Danielle Morgan is a 24 y.o. G28P1011 female who presents for a postpartum visit. She is 4 weeks postpartum following a normal spontaneous vaginal delivery.  I have fully reviewed the prenatal and intrapartum course. The delivery was at 38.3 gestational weeks.  Anesthesia: epidural. Postpartum course has been uncomplicated. Baby is doing well. Baby is feeding by breast, predominantly pumping. Bleeding staining only. Bowel function is normal. Bladder function is normal. Patient is not sexually active. Contraception method is IUD. Postpartum depression screening: negative.  The following portions of the patient's history were reviewed and updated as appropriate: allergies, current medications, past family history, past medical history, past social history, past surgical history and problem list.  Review of Systems A comprehensive review of systems was negative except for: intermittent nipple soreness and cracking r/t breastfeeding    Objective:  unknown if currently breastfeeding.  General:  alert, cooperative, appears stated age and no distress   Breasts:  negative  Lungs: clear to auscultation bilaterally  Heart:  regular rate and rhythm, S1, S2 normal, no murmur, click, rub or gallop  Abdomen: soft, non-tender; bowel sounds normal; no masses,  no organomegaly   Vulva:  normal  Vagina: normal vagina  Cervix:  no lesions  Corpus: not examined  Adnexa:  not evaluated  Rectal Exam: Not performed.        Assessment:    Normal postpartum exam. Pap smear not performed at today's visit. Normotensive today, not taking Vasotec.  Plan:   Essential components of care per ACOG recommendations:  1.  Mood and well being: Patient with negative depression screening today. Reviewed local resources for support.  - Patient does not use tobacco.  - hx of drug use? No    2. Infant care and feeding:  -Patient currently breastmilk feeding? Yes   3. Sexuality, contraception  and birth spacing - Patient does not want a pregnancy in the next year.  See separate IUD insertion note  4. Sleep and fatigue -Encouraged family/partner/community support of 4 hrs of uninterrupted sleep to help with mood and fatigue  5. Physical Recovery  - Discussed patients delivery and complications - No urinary incontinence  Clayton Bibles, MSN, CNM Certified Nurse Midwife, [employer] for [employer], [facility] 02/10/20 1:57 PM

## 2020-02-10 NOTE — Progress Notes (Signed)
    GYNECOLOGY OFFICE PROCEDURE NOTE  Danielle Morgan is a 24 y.o. G3P1011 here for Liletta IUD insertion. No GYN concerns.  Last pap smear was on 05/26/2018 and was normal.  IUD Insertion Procedure Note Patient identified, informed consent performed, consent signed.   Discussed risks of irregular bleeding, cramping, infection, malpositioning or misplacement of the IUD outside the uterus which may require further procedure such as laparoscopy. Also discussed >99% contraception efficacy, increased risk of ectopic pregnancy with failure of method.  Time out was performed.  Urine pregnancy test negative.  Speculum placed in the vagina.  Cervix visualized.  Cleaned with Betadine x 2.  Grasped anteriorly with a single tooth tenaculum.  Uterus sounded to 7 cm.  Liletta IUD placed per manufacturer's recommendations.  Strings trimmed to 3 cm. Tenaculum was removed, good hemostasis noted.  Patient tolerated procedure well.   Patient was given post-procedure instructions.  She was advised to have backup contraception for one week.  Patient was also asked to check IUD strings periodically and follow up in 4 weeks for IUD check.   Clayton Bibles, MSN, CNM Certified Nurse Midwife, [employer] for [employer], [facility] 02/10/20 1:54 PM

## 2020-02-18 ENCOUNTER — Telehealth: Payer: Self-pay | Admitting: *Deleted

## 2020-02-18 MED ORDER — DICLOXACILLIN SODIUM 500 MG PO CAPS: 500.0000 mg | ORAL_CAPSULE | Freq: Four times a day (QID) | ORAL | 0 refills | Status: DC

## 2020-02-18 NOTE — Telephone Encounter (Signed)
Pt called stating she has been having issues with blocked ducts with pumping. Pt does notice that her breast are red but is unsure if its from rubbing them or due to mastitis. Has felt like she has ran a fever as well. Informed pt to reach out to lactation to see if they can help and if they feel she does need tx for mastitis to call us back. Pt called back and sates they said to call her in all purpose nipple cream and to also treat for possible mastitis. Dr Shawnie Pons informed and meds will be sent to pharmacy.

## 2020-03-09 ENCOUNTER — Ambulatory Visit (INDEPENDENT_AMBULATORY_CARE_PROVIDER_SITE_OTHER): Payer: 59 | Admitting: Medical

## 2020-03-09 ENCOUNTER — Other Ambulatory Visit: Payer: Self-pay

## 2020-03-09 ENCOUNTER — Encounter: Payer: Self-pay | Admitting: Medical

## 2020-03-09 VITALS — BP 137/86 | HR 64 | Wt 191.0 lb

## 2020-03-09 DIAGNOSIS — Z8759 Personal history of other complications of pregnancy, childbirth and the puerperium: Secondary | ICD-10-CM | POA: Insufficient documentation

## 2020-03-09 DIAGNOSIS — Z30431 Encounter for routine checking of intrauterine contraceptive device: Secondary | ICD-10-CM | POA: Diagnosis not present

## 2020-03-09 NOTE — Progress Notes (Signed)
°  History:  [name] is a 24 y.o. [id number] who presents to clinic today for string check. She had IUD placed at [facility] visit on 01/21/20. She states minimal occasional spotting. She denies pain. IUD strings are noticeable to her at the level of the introitus occasionally.    The following portions of the patient's history were reviewed and updated as appropriate: allergies, current medications, family history, past medical history, social history, past surgical history and problem list.  Review of Systems:  Review of Systems  Constitutional: Negative for fever.  Gastrointestinal: Negative for abdominal pain.  Genitourinary:       Neg - vaginal bleeding      Objective:  Physical Exam BP 137/86    Pulse 64    Wt 191 lb (86.6 kg)    BMI 31.78 kg/m  Physical Exam  Constitutional: No distress.  Cardiovascular: Normal rate.  Respiratory: Effort normal.  Genitourinary: There is no rash or lesion on the right labia. There is no rash or lesion on the left labia. Cervix exhibits no lesion, no discharge and no friability.    No vaginal discharge or bleeding.  No bleeding in the vagina.    Genitourinary Comments: IUD strings are visible and appear ~ 4 inches from cervical os. Strings were trimmed back to ~ 3 inches   Neurological: She is alert.  Psychiatric: Mood normal.    Assessment & Plan:  1. IUD check up - Strings were trimmed today, likely excess length from continued reduction of uterine size postpartum - Patient advised that if strings bother her or her partner they can be trimmed to the level of the cervix - Patient will call with any further issues - Follow-up for annual exam in 1 year or sooner PRN  Approximately 15 minutes of total time was spent with this patient   Kathlene Cote 03/09/2020 2:33 PM

## 2020-03-09 NOTE — Progress Notes (Signed)
Strings are poking out

## 2020-03-09 NOTE — Patient Instructions (Signed)

## 2020-04-18 ENCOUNTER — Telehealth: Payer: Self-pay | Admitting: *Deleted

## 2020-04-18 NOTE — Telephone Encounter (Signed)
Patient called stating that she thinks that she may have hemorrhoids. Patient stated that she called her OB/GYN and was advised to reach out to her PCP for this. Patient stated that she has had some rectal bleeding after having bowel movements. Patient stated that all of this started after having her baby 3 months ago. . Patient stated that she is also having some pain with the bowel movements and feels like something is tearing. Patient was offered an appointment tomorrow which she declined stating that she needs time to line up a babysitter for her two children. Patient scheduled for an appointment with Dr. Milinda Antis  04/20/20 at 3:15. Patient had a negative covid screening.

## 2020-04-18 NOTE — Telephone Encounter (Signed)
Aware, I will see her then  

## 2020-04-20 ENCOUNTER — Ambulatory Visit: Payer: No Typology Code available for payment source | Admitting: Family Medicine

## 2020-07-22 ENCOUNTER — Telehealth: Payer: Self-pay | Admitting: *Deleted

## 2020-07-22 DIAGNOSIS — Z20822 Contact with and (suspected) exposure to covid-19: Secondary | ICD-10-CM | POA: Diagnosis not present

## 2020-07-22 NOTE — Telephone Encounter (Signed)
Patient left a voicemail stating that she started with some sinus issues last night and wants to know what she can take over the counter. Patient stated that she is currently breast feeding.  Called patient back to get more information. Has she contacted her OB/GYN Left message on voicemail to call the office back.

## 2020-07-25 NOTE — Telephone Encounter (Signed)
I would use nasal saline first.  If needed, she can try OTC flonase daily.  I checked on this and per guidelines, it should be okay to use.  "Use of intranasal fluticasone is considered compatible with breastfeeding."  Thanks. Hope she feels better soon.

## 2020-07-25 NOTE — Telephone Encounter (Signed)
I spoke with pt who got a negative covid test result; no fever, pt can taste and pt has sinus drainage at back of throat. Pt is presently not taking anything for sinus due to breast feeding. Pt request cb. Pamala Hurry NP out of office sending note to Dr Para March who is in office.

## 2020-07-25 NOTE — Telephone Encounter (Signed)
[facility] - Client TELEPHONE ADVICE RECORD [facility] Patient Name: Danielle Morgan Gender: Female DOB: 04-Mar-1996 Age: 24 Y 8 M 22 D Return Phone Number: [phone] (Primary) Address: City/State/Zip: [place] Client [facility] - Client Client Site [facility] - Day Physician Nicki Reaper - NP Contact Type Call Who Is Calling Patient / Member / Family / Caregiver Call Type Triage / Clinical Relationship To Patient Self Return Phone Number [phone] (Primary) Chief Complaint Nasal Congestion Reason for Call Symptomatic / Request for Health Information Initial Comment Caller states can she continue to breast feed while taking sinus medication. Which medication is safe. Additional Comment runny nose, small issue with lost of taste. Translation No Nurse Assessment Nurse: Elesa Hacker, RN, Nash Dimmer Date/Time Lamount Cohen Time): 07/22/2020 4:08:12 PM Confirm and document reason for call. If symptomatic, describe symptoms. ---Caller states can she continue to breast feed while taking sinus medication. Which medication is safe. Caller advised that she had a runny nose and advised that she is breastfeeding, loss of taste. No temp. Went through to get a Covid test and results are not back yet. Does the patient have any new or worsening symptoms? ---Yes Will a triage be completed? ---Yes Related visit to physician within the last 2 weeks? ---No Does the PT have any chronic conditions? (i.e. diabetes, asthma, this includes High risk factors for pregnancy, etc.) ---No Is the patient pregnant or possibly pregnant? (Ask all females between the ages of 74-55) ---No Is this a behavioral health or substance abuse call? ---No Guidelines Guideline Title Affirmed Question Affirmed Notes Nurse Date/Time (Eastern Time) COVID-19 - Diagnosed or Suspected COVID-19 Testing, questions about Elesa Hacker, RN, Nash Dimmer 07/22/2020  4:10:07 PM Disp. Time Lamount Cohen Time) Disposition Final User 07/22/2020 4:11:58 PM Home Care Yes Deaton, RN, Nash Dimmer PLEASE NOTE: All timestamps contained within this report are represented as [place] Standard Time. CONFIDENTIALTY NOTICE: This fax transmission is intended only for the addressee. It contains information that is legally privileged, confidential or otherwise protected from use or disclosure. If you are not the intended recipient, you are strictly prohibited from reviewing, disclosing, copying using or disseminating any of this information or taking any action in reliance on or regarding this information. If you have received this fax in error, please notify us immediately by telephone so that we can arrange for its return to [place]. Phone: [phone], Toll-Free: [phone], Fax: [phone] Page: 2 of 2 Call Id: [id number] Care Advice Given Per Guideline HOME CARE: * You should be able to treat this at home. NOTE TO TRIAGER - COVID-19 TESTING: * FOR QUESTIONS ABOUT TESTING, it is often best to DIRECT THE PATIENT TO THEIR DOCTOR (or NP/PA), during office hours. Their doctor is the best resource for up-to-date information on testing. Testing in a lab requires a doctor's order (as with all medical tests). * Many clinics, retail clinics (such as [facility], [facility]), and urgent care centers perform testing. COVID-19 - WHO NEEDS TESTING? * SYMPTOMS: All people who have symptoms of COVID-19 should get tested WITHIN 3 DAYS of becoming ill. COVID-19 - WHERE TO GO FOR TESTING? * Your doctor (or NP/PA) can order a COVID-19 test for you. CALL BACK IF: * You have more questions. CARE ADVICE given per COVID-19 - DIAGNOSED OR SUSPECTED (Adult) guideline. After Care Instructions Given Call Event Type User Date / Time Description Education document email Elesa Hacker, RN, Nash Dimmer 07/22/2020 4:18:15 PM COVID-19 Diagnosed or Suspected Education document email Deaton, RN, Nash Dimmer 07/22/2020  4:18:15 PM COVID-19  [employer] Education document email Deaton, RN, Nash Dimmer 07/22/2020 4:18:15 PM What To Do If You Are Sick With COVID-19_English Education document email Deaton, RN, Nash Dimmer 07/22/2020 4:18:15 PM Breastfeeding - Mother's Illness

## 2020-07-29 NOTE — Telephone Encounter (Signed)
Please contact patient and close encounter when completed.

## 2020-07-29 NOTE — Telephone Encounter (Signed)
Pt is doing better and is aware as instructed

## 2020-10-04 ENCOUNTER — Telehealth: Payer: Self-pay

## 2020-10-04 NOTE — Telephone Encounter (Signed)
[facility] Night - Client TELEPHONE ADVICE RECORD [employer] Patient Name: Danielle Morgan Gender: Female DOB: Jan 29, 1996 Age: 25 Y 11 M 3 D Return Phone Number: [phone] (Primary), [phone] (Secondary) Address[place] [address] Client [facility] Night - Client Client Site [facility] - Night Physician Nicki Reaper - NP Contact Type Call Who Is Calling Patient / Member / Family / Caregiver Call Type Triage / Clinical Relationship To Patient Self Return Phone Number [phone] (Primary) Chief Complaint Urination Pain Reason for Call Symptomatic / Request for Health Information Initial Comment Caller states she is experiencing UTI sx. She has painful and frequent urination. Translation No Nurse Assessment Nurse: Elesa Hacker, RN, Nash Dimmer Date/Time (Eastern Time): 10/03/2020 9:58:25 AM Confirm and document reason for call. If symptomatic, describe symptoms. ---Caller states she is experiencing UTI sx. She has painful and frequent urination. Caller advised that she is urinating frequently, having pain after she urinates. No temp. Does the patient have any new or worsening symptoms? ---Yes Will a triage be completed? ---Yes Related visit to physician within the last 2 weeks? ---No Does the PT have any chronic conditions? (i.e. diabetes, asthma, this includes High risk factors for pregnancy, etc.) ---No Is the patient pregnant or possibly pregnant? (Ask all females between the ages of 17-55) ---No Is this a behavioral health or substance abuse call? ---No Guidelines Guideline Title Affirmed Question Affirmed Notes Nurse Date/Time (Eastern Time) Urination Pain - Female All other patients with painful urination (Exception: [1] EITHER frequency or urgency AND [2] has on-call doctor) Elesa Hacker, RN, Nash Dimmer 10/03/2020 9:59:21 AM Disp. Time Lamount Cohen Time) Disposition Final User 10/03/2020  10:04:23 AM See PCP within 24 Hours Yes Deaton, RN, Nash Dimmer PLEASE NOTE: All timestamps contained within this report are represented as [place] Standard Time. CONFIDENTIALTY NOTICE: This fax transmission is intended only for the addressee. It contains information that is legally privileged, confidential or otherwise protected from use or disclosure. If you are not the intended recipient, you are strictly prohibited from reviewing, disclosing, copying using or disseminating any of this information or taking any action in reliance on or regarding this information. If you have received this fax in error, please notify us immediately by telephone so that we can arrange for its return to [place]. Phone: [phone], Toll-Free: [phone], Fax: [phone] Page: 2 of 2 Call Id: [id number] Caller Disagree/Comply Comply Caller Understands Yes PreDisposition Did not know what to do Care Advice Given Per Guideline SEE PCP WITHIN 24 HOURS: * IF OFFICE WILL BE OPEN: You need to be examined within the next 24 hours. Call your doctor (or NP/PA) when the office opens and make an appointment. REASSURANCE AND EDUCATION: * This could be an urinary tract infection. * You should see your PCP to be examined and tested. DRINK EXTRA FLUIDS: * Drink extra fluids. * Drink 8 to 10 cups (1,800 to 2,400 ml) of liquids a day. * Reason: This will water-down your urine and make it less painful to pass. If there is an infection, this will help wash out the germs from your bladder. WARM SALINE SITZ BATHS - TWICE DAILY FOR URINATION PAIN: * Sit in a warm sitz bath for 20 minutes twice a day. This will decrease pain and irritation, keep the area clean, and help with healing. * Afterwards, pat area dry with unscented toilet paper. WARM SALINE SITZ BATH - HOW TO MAKE A SITZ BATH: * Here is how you can make a saline (salt) sitz  bath. * Fill the tub with warm water until it is 3 to 4 inches (7-10 cm) deep. * Add 1/4 cup (80 g) of table  salt or baking soda to a tub of warm water. Stir the water until it dissolves. * ACETAMINOPHEN - EXTRA STRENGTH TYLENOL: Take 1,000 mg (two 500 mg pills) every 8 hours as needed. Each Extra Strength Tylenol pill has 500 mg of acetaminophen. The most you should take each day is 3,000 mg (6 pills a day). * IBUPROFEN (E.G., MOTRIN, ADVIL): Take 400 mg (two 200 mg pills) by mouth every 6 hours. The most you should take each day is 1,200 mg (six 200 mg pills), unless your doctor has told you to take more. CALL BACK IF: * Fever or back pain occurs * You become worse CARE ADVICE given per Urination Pain - Female (Adult) guideline. Referrals REFERRED TO PCP OFFICE

## 2020-10-04 NOTE — Telephone Encounter (Signed)
I spoke with pt; pt said her symptoms have lessened; now pt is not voiding as often and pain upon urination is uncomfortable but not as bad as yesterday. Pt has vaginal itching and perineal itching today but no vaginal discharge. Pt said she usually gets UTI and yeast infections as well. No fever, no chills,no cough or SOB and no H/a; no diarrhea and no lost of taste or smell. No abd or back pain, no S/T runny nose or vomiting. No known exposure to covid. Offered pt in office appt this afternoon but pt could not be seen today due to no one to watch her children. Pt scheduled in office appt with Dr Milinda Antis on 10/05/20 at 10:30. UC & ED precautions given and pt voiced understanding. sending note to Dr Milinda Antis.

## 2020-10-05 ENCOUNTER — Telehealth (INDEPENDENT_AMBULATORY_CARE_PROVIDER_SITE_OTHER): Payer: 59 | Admitting: Family Medicine

## 2020-10-05 ENCOUNTER — Other Ambulatory Visit: Payer: Self-pay

## 2020-10-05 ENCOUNTER — Encounter: Payer: Self-pay | Admitting: Family Medicine

## 2020-10-05 DIAGNOSIS — R300 Dysuria: Secondary | ICD-10-CM | POA: Insufficient documentation

## 2020-10-05 LAB — POC URINALSYSI DIPSTICK (AUTOMATED)
Bilirubin, UA: NEGATIVE
Blood, UA: 25
Glucose, UA: NEGATIVE
Ketones, UA: NEGATIVE
Leukocytes, UA: NEGATIVE
Nitrite, UA: NEGATIVE
Protein, UA: NEGATIVE
Spec Grav, UA: >=1.03 — AB (ref 1.010–1.025)
Urobilinogen, UA: 0.2 E.U./dL
pH, UA: 6 (ref 5.0–8.0)

## 2020-10-05 NOTE — Addendum Note (Signed)
Addended by: Alvina Chou on: 10/05/2020 04:28 PM   Modules accepted: Orders

## 2020-10-05 NOTE — Patient Instructions (Signed)
Keep drinking lots of fluids (water)  Come to the office before we close today and leave a urine sample We will process that and make a recommendation from there In the meantime if worse or if any new symptoms like nausea or flank pain please let us know

## 2020-10-05 NOTE — Addendum Note (Signed)
Addended by: Roxy Manns A on: 10/05/2020 10:44 AM   Modules accepted: Orders

## 2020-10-05 NOTE — Addendum Note (Signed)
Addended by: Aquilla Solian on: 10/05/2020 03:53 PM   Modules accepted: Orders

## 2020-10-05 NOTE — Assessment & Plan Note (Signed)
Started 2 d ago and improved with inc in fluids Frequency/urgency also but no fever or flank pain  She occ gets yeast vaginitis as well- had itching and that is improved inst pt to come to the office and leave a urine sample  Plan from there Continue fluids Watch for worse pain / blood in urine or fever or nausea (will call)

## 2020-10-05 NOTE — Progress Notes (Signed)
Virtual Visit via Video Note  I connected with Danielle Morgan on 10/05/20 at 10:30 AM EST by a video enabled telemedicine application and verified that I am speaking with the correct person using two identifiers.  Location: Patient: home Provider: office   I discussed the limitations of evaluation and management by telemedicine and the availability of in person appointments. The patient expressed understanding and agreed to proceed.  Parties involved in encounter  Patient: Danielle Morgan  Provider:  Roxy Manns MD   History of Present Illness:   25 yo pt of NP Sampson Si presents for urinary symptoms   Started 2 d ago  Noticed frequency of urination  Then pressure/pain to urinate (worse at night) A little better the next day (now tolerable but no gone)   No blood in urine  No flank pain  No fever or nausea    She is drinking lots of fluids   Vaginal itch yesterday- improved now  She has had yeast infections frequently in the past   Had one uti when pregnant about a year ago -none since  Just had first period in 7 mo (has 45 mo old baby) -that is stopped    She has IUD for contraception   Patient Active Problem List   Diagnosis Date Noted  . Dysuria 10/05/2020  . History of gestational hypertension 03/09/2020  . IUD (intrauterine device) in place 02/10/2020  . Migraines 12/16/2017   Past Medical History:  Diagnosis Date  . Frequent headaches    Past Surgical History:  Procedure Laterality Date  . NO PAST SURGERIES     Social History   Tobacco Use  . Smoking status: Never Smoker  . Smokeless tobacco: Never Used  Vaping Use  . Vaping Use: Never used  Substance Use Topics  . Alcohol use: No    Alcohol/week: 0.0 standard drinks  . Drug use: No   Family History  Problem Relation Age of Onset  . Arthritis Mother   . Bipolar disorder Father   . Cancer Paternal Grandmother        Lung  . ADD / ADHD Sister   . ADD / ADHD Brother   . Heart disease Maternal  Uncle   . Arthritis Maternal Grandfather   . Hypertension Maternal Grandfather   . Alcohol abuse Maternal Grandfather    No Known Allergies Current Outpatient Medications on File Prior to Visit  Medication Sig Dispense Refill  . cyclobenzaprine (FLEXERIL) 10 MG tablet Take 1 tablet (10 mg total) by mouth every 8 (eight) hours as needed for muscle spasms. (Patient taking differently: Take 10 mg by mouth every 8 (eight) hours as needed for muscle spasms. Spasms.) 30 tablet 3   No current facility-administered medications on file prior to visit.   Review of Systems  Constitutional: Negative for chills, fever and malaise/fatigue.  HENT: Negative for congestion, ear pain, sinus pain and sore throat.   Eyes: Negative for blurred vision, discharge and redness.  Respiratory: Negative for cough, shortness of breath and stridor.   Cardiovascular: Negative for chest pain, palpitations and leg swelling.  Gastrointestinal: Negative for abdominal pain, diarrhea, nausea and vomiting.  Genitourinary: Positive for dysuria, frequency and urgency. Negative for flank pain and hematuria.  Musculoskeletal: Negative for myalgias.  Skin: Negative for rash.  Neurological: Negative for dizziness and headaches.    Observations/Objective: Patient appears well, in no distress Weight is baseline  No facial swelling or asymmetry Normal voice-not hoarse and no slurred speech No obvious tremor or mobility  impairment Moving neck and UEs normally Able to hear the call well  No cough or shortness of breath during interview  Talkative and mentally sharp with no cognitive changes No skin changes on face or neck , no rash or pallor Affect is normal    Assessment and Plan: Problem List Items Addressed This Visit      Other   Dysuria    Started 2 d ago and improved with inc in fluids Frequency/urgency also but no fever or flank pain  She occ gets yeast vaginitis as well- had itching and that is improved inst pt  to come to the office and leave a urine sample  Plan from there Continue fluids Watch for worse pain / blood in urine or fever or nausea (will call)            Follow Up Instructions: Keep drinking lots of fluids (water)  Come to the office before we close today and leave a urine sample We will process that and make a recommendation from there In the meantime if worse or if any new symptoms like nausea or flank pain please let us know    I discussed the assessment and treatment plan with the patient. The patient was provided an opportunity to ask questions and all were answered. The patient agreed with the plan and demonstrated an understanding of the instructions.   The patient was advised to call back or seek an in-person evaluation if the symptoms worsen or if the condition fails to improve as anticipated.     Roxy Manns, MD

## 2020-10-06 LAB — URINE CULTURE
MICRO NUMBER:: 11433570
SPECIMEN QUALITY:: ADEQUATE

## 2020-10-15 ENCOUNTER — Other Ambulatory Visit: Payer: Self-pay

## 2020-10-15 ENCOUNTER — Ambulatory Visit
Admission: RE | Admit: 2020-10-15 | Discharge: 2020-10-15 | Disposition: A | Discharge status: Home / Self Care | Payer: BC Managed Care – PPO | Source: Ambulatory Visit | Attending: Family Medicine | Admitting: Family Medicine

## 2020-10-15 VITALS — BP 136/83 | HR 82 | Temp 97.5°F | Resp 18

## 2020-10-15 DIAGNOSIS — N898 Other specified noninflammatory disorders of vagina: Secondary | ICD-10-CM | POA: Insufficient documentation

## 2020-10-15 DIAGNOSIS — N390 Urinary tract infection, site not specified: Secondary | ICD-10-CM | POA: Diagnosis not present

## 2020-10-15 LAB — POCT URINALYSIS DIP (MANUAL ENTRY)
Bilirubin, UA: NEGATIVE
Glucose, UA: NEGATIVE mg/dL
Ketones, POC UA: NEGATIVE mg/dL
Nitrite, UA: NEGATIVE
Protein Ur, POC: NEGATIVE mg/dL
Spec Grav, UA: 1.025 (ref 1.010–1.025)
Urobilinogen, UA: 0.2 E.U./dL
pH, UA: 5.5 (ref 5.0–8.0)

## 2020-10-15 MED ORDER — NITROFURANTOIN MONOHYD MACRO 100 MG PO CAPS
100.0000 mg | ORAL_CAPSULE | Freq: Two times a day (BID) | ORAL | 0 refills | Status: DC
Start: 2020-10-15 — End: 2020-12-06

## 2020-10-15 MED ORDER — FLUCONAZOLE 150 MG PO TABS
150.0000 mg | ORAL_TABLET | Freq: Once | ORAL | 0 refills | Status: AC
Start: 2020-10-15 — End: 2020-10-15

## 2020-10-15 NOTE — ED Provider Notes (Signed)
[facility]    CSN: [id number] Arrival date & time: 10/15/20  1100      History   Chief Complaint Chief Complaint  Patient presents with  . Dysuria    HPI Danielle Morgan is a 25 y.o. female.   Here today with ongoing issues with dysuria and vaginal irritation the past 2 weeks. States she was seen initially and had neg U/A, sxs improved for a period of time and then last night dysuria and irritation returned. Now has a pelvic pressure immediately after voiding as well. Not currently taking anything for sxs. Denies vaginal discharge or rashes, flank pain, fever, N/V, concern for STIs. Has IUD in place, LMP 09/24/20. Hx of UTI occasionally.      Past Medical History:  Diagnosis Date  . Frequent headaches     Patient Active Problem List   Diagnosis Date Noted  . Dysuria 10/05/2020  . History of gestational hypertension 03/09/2020  . IUD (intrauterine device) in place 02/10/2020  . Migraines 12/16/2017    Past Surgical History:  Procedure Laterality Date  . NO PAST SURGERIES      OB History    Gravida  3   Para  2   Term  2   Preterm      AB  1   Living  2     SAB  1   IAB      Ectopic      Multiple  0   Live Births  2            Home Medications    Prior to Admission medications   Medication Sig Start Date End Date Taking? Authorizing Provider  fluconazole (DIFLUCAN) 150 MG tablet Take 1 tablet (150 mg total) by mouth once for 1 dose. 10/15/20 10/15/20 Yes Particia Nearing, PA-C  nitrofurantoin, macrocrystal-monohydrate, (MACROBID) 100 MG capsule Take 1 capsule (100 mg total) by mouth 2 (two) times daily. 10/15/20  Yes Particia Nearing, PA-C  cyclobenzaprine (FLEXERIL) 10 MG tablet Take 1 tablet (10 mg total) by mouth every 8 (eight) hours as needed for muscle spasms. Patient taking differently: Take 10 mg by mouth every 8 (eight) hours as needed for muscle spasms. Spasms. 08/21/19   Bertram Denver, PA-C    Family  History Family History  Problem Relation Age of Onset  . Arthritis Mother   . Bipolar disorder Father   . Cancer Paternal Grandmother        Lung  . ADD / ADHD Sister   . ADD / ADHD Brother   . Heart disease Maternal Uncle   . Arthritis Maternal Grandfather   . Hypertension Maternal Grandfather   . Alcohol abuse Maternal Grandfather     Social History Social History   Tobacco Use  . Smoking status: Never Smoker  . Smokeless tobacco: Never Used  Vaping Use  . Vaping Use: Never used  Substance Use Topics  . Alcohol use: No    Alcohol/week: 0.0 standard drinks  . Drug use: No     Allergies   Patient has no known allergies.   Review of Systems Review of Systems PER HPI   Physical Exam Triage Vital Signs ED Triage Vitals  Enc Vitals Group     BP 10/15/20 1107 136/83     Pulse Rate 10/15/20 1107 82     Resp 10/15/20 1107 18     Temp 10/15/20 1107 (!) 97.5 F (36.4 C)     Temp Source 10/15/20  1107 Oral     SpO2 10/15/20 1107 97 %     Weight --      Height --      Head Circumference --      Peak Flow --      Pain Score 10/15/20 1109 0     Pain Loc --      Pain Edu? --      Excl. in GC? --    No data found.  Updated Vital Signs BP 136/83   Pulse 82   Temp (!) 97.5 F (36.4 C) (Oral)   Resp 18   LMP 09/24/2020 (Approximate)   SpO2 97%   Breastfeeding Yes   Visual Acuity Right Eye Distance:   Left Eye Distance:   Bilateral Distance:    Right Eye Near:   Left Eye Near:    Bilateral Near:     Physical Exam Vitals and nursing note reviewed.  Constitutional:      Appearance: Normal appearance. She is not ill-appearing.  HENT:     Head: Atraumatic.  Eyes:     Extraocular Movements: Extraocular movements intact.     Conjunctiva/sclera: Conjunctivae normal.  Cardiovascular:     Rate and Rhythm: Normal rate and regular rhythm.     Heart sounds: Normal heart sounds.  Pulmonary:     Effort: Pulmonary effort is normal.     Breath sounds: Normal  breath sounds.  Abdominal:     General: Bowel sounds are normal. There is no distension.     Palpations: Abdomen is soft.     Tenderness: There is no abdominal tenderness. There is no right CVA tenderness, left CVA tenderness or guarding.  Genitourinary:    Comments: GU exam deferred, self swab performed Musculoskeletal:        General: Normal range of motion.     Cervical back: Normal range of motion and neck supple.  Skin:    General: Skin is warm and dry.  Neurological:     Mental Status: She is alert and oriented to person, place, and time.  Psychiatric:        Mood and Affect: Mood normal.        Thought Content: Thought content normal.        Judgment: Judgment normal.     UC Treatments / Results  Labs (all labs ordered are listed, but only abnormal results are displayed) Labs Reviewed  POCT URINALYSIS DIP (MANUAL ENTRY) - Abnormal; Notable for the following components:      Result Value   Clarity, UA cloudy (*)    Blood, UA large (*)    Leukocytes, UA Large (3+) (*)    All other components within normal limits  URINE CULTURE  CERVICOVAGINAL ANCILLARY ONLY    EKG   Radiology No results found.  Procedures Procedures (including critical care time)  Medications Ordered in UC Medications - No data to display  Initial Impression / Assessment and Plan / UC Course  I have reviewed the triage vital signs and the nursing notes.  Pertinent labs & imaging results that were available during my care of the patient were reviewed by me and considered in my medical decision making (see chart for details).     U/A today showing large amounts of leukocytes and hgb, urine culture and aptima swab pending. Will treat with macrobid while awaiting these and push fluids. She requests a diflucan as abx give her yeast infections. Avoid vaginal irritants. Return precautions given if acutely worsening in meantime.  Final Clinical Impressions(s) / UC Diagnoses   Final diagnoses:   Acute lower UTI  Vaginal irritation   Discharge Instructions   None    ED Prescriptions    Medication Sig Dispense Auth. Provider   nitrofurantoin, macrocrystal-monohydrate, (MACROBID) 100 MG capsule Take 1 capsule (100 mg total) by mouth 2 (two) times daily. 10 capsule Particia Nearing, PA-C   fluconazole (DIFLUCAN) 150 MG tablet Take 1 tablet (150 mg total) by mouth once for 1 dose. 1 tablet Particia Nearing, [place]     PDMP not reviewed this encounter.   Particia Nearing, [place] 10/15/20 1246

## 2020-10-15 NOTE — ED Triage Notes (Signed)
Pt had eval 1/19 for dysuria and vaginal irritation.  States urine was negative; vaginal itching had improved some but returned last night.  States "not like past yeast infections".  Dysuria had improved, but returned again last night - describes as a "pressure" immediately after voiding.

## 2020-10-18 ENCOUNTER — Telehealth: Payer: Self-pay

## 2020-10-18 LAB — CERVICOVAGINAL ANCILLARY ONLY
Bacterial Vaginitis (gardnerella): NEGATIVE
Candida Glabrata: NEGATIVE
Candida Vaginitis: NEGATIVE
Chlamydia: NEGATIVE
Comment: NEGATIVE
Comment: NEGATIVE
Comment: NEGATIVE
Comment: NEGATIVE
Comment: NEGATIVE
Comment: NORMAL
Neisseria Gonorrhea: NEGATIVE
Trichomonas: NEGATIVE

## 2020-10-18 NOTE — Telephone Encounter (Signed)
Pt said on 10/05/20 pt had virtual visit with Dr Milinda Antis for possible UTI and yeast and pt said symptoms went away. Pt started this past weekend with symptoms again of frequency of urine and vaginal and perineal itching; no vaginal discharge and pt went to UC on [address] and was given Nitrofurantoin 100 mg taking one pill bid on 10/15/20; pt started abx on 10/15/20 in evening. Pt now has 4 pills left. Pt has not gotten a cb from UC about culture but pt saw culture results on my chart and appears neg. Pt was doing better and then On 10/17/20 pt said for about 2 hours had frequency of urine and vaginal and perineal itching but no vaginal discharge and no abd pain and no fever. Pt said symptoms resolved after about 2 hours. Today pt has no symptoms; pt wants to know if should continue the abx since culture was neg or what to do. Pt wants to know if needs to make FU appt with Pamala Hurry NP or not since doing OK today. Pt wants to know if should continue abx until finished or not. No covid symptoms or no known exposure to covid. If pt does not get cb today pt will take abx this evening until hears from Pamala Hurry NP. Sending note to Pamala Hurry NP and [facility] CMA.

## 2020-10-19 LAB — URINE CULTURE: Culture: 10000 — AB

## 2020-10-19 NOTE — Telephone Encounter (Signed)
Continue abx until course finished. She grew a small amount of bacteria, not technically enough to be considered a UTI but with intermittent symptoms would at least finish the course she started.

## 2020-10-21 NOTE — Telephone Encounter (Signed)
Pt states she is feeling better and will f/u if needed

## 2020-12-06 ENCOUNTER — Other Ambulatory Visit: Payer: Self-pay

## 2020-12-06 ENCOUNTER — Ambulatory Visit (INDEPENDENT_AMBULATORY_CARE_PROVIDER_SITE_OTHER): Payer: BC Managed Care – PPO | Admitting: Internal Medicine

## 2020-12-06 ENCOUNTER — Encounter: Payer: Self-pay | Admitting: Internal Medicine

## 2020-12-06 ENCOUNTER — Ambulatory Visit (INDEPENDENT_AMBULATORY_CARE_PROVIDER_SITE_OTHER)
Admission: RE | Admit: 2020-12-06 | Discharge: 2020-12-06 | Disposition: A | Discharge status: Home / Self Care | Payer: BC Managed Care – PPO | Source: Ambulatory Visit | Attending: Internal Medicine | Admitting: Internal Medicine

## 2020-12-06 VITALS — BP 126/82 | HR 93 | Temp 97.1°F | Ht 65.0 in | Wt 217.0 lb

## 2020-12-06 DIAGNOSIS — G8929 Other chronic pain: Secondary | ICD-10-CM

## 2020-12-06 DIAGNOSIS — M545 Low back pain, unspecified: Secondary | ICD-10-CM | POA: Diagnosis not present

## 2020-12-06 DIAGNOSIS — R635 Abnormal weight gain: Secondary | ICD-10-CM

## 2020-12-06 NOTE — Patient Instructions (Signed)

## 2020-12-06 NOTE — Progress Notes (Signed)
Subjective:    Patient ID: Danielle Morgan, female    DOB: 1995/11/07, 25 y.o.   MRN: [id number]  HPI  Pt presents to the clinic today to discuss her weight loss options. Her current weight is 217 lbs with a BMI of 36.11. She has tried intermittent fasting, tried cutting back on portion sizes and has been trying to work out at least 3 times weekly. She does feel like she is having more pain in back since having her second child. She reports the pain is more on the left side/midline. She describes the pain as sore and stabbing. The pain is worse with going from a sitting to standing position, changing position in beds and when beginning to walk. The pain does not radiate down her legs. She denies numbness, tingling, weakness, loss of bowel/bladder control. She denies prior back injury but does notice increased pain with this weight gain.  Breakfast: bacon and eggs or skips breakfast Lunch: usually consist of protein, veggies and starch Dinner: protein, veggies and starch Snacks: Peanut butter, granola  Exercise: Walking 1-2 miles 3 x week  Review of Systems      Past Medical History:  Diagnosis Date  . Frequent headaches     Current Outpatient Medications  Medication Sig Dispense Refill  . cyclobenzaprine (FLEXERIL) 10 MG tablet Take 1 tablet (10 mg total) by mouth every 8 (eight) hours as needed for muscle spasms. (Patient taking differently: Take 10 mg by mouth every 8 (eight) hours as needed for muscle spasms. Spasms.) 30 tablet 3  . nitrofurantoin, macrocrystal-monohydrate, (MACROBID) 100 MG capsule Take 1 capsule (100 mg total) by mouth 2 (two) times daily. 10 capsule 0   No current facility-administered medications for this visit.    No Known Allergies  Family History  Problem Relation Age of Onset  . Arthritis Mother   . Bipolar disorder Father   . Cancer Paternal Grandmother        Lung  . ADD / ADHD Sister   . ADD / ADHD Brother   . Heart disease Maternal Uncle   .  Arthritis Maternal Grandfather   . Hypertension Maternal Grandfather   . Alcohol abuse Maternal Grandfather     Social History   Socioeconomic History  . Marital status: Married    Spouse name: Not on file  . Number of children: Not on file  . Years of education: Not on file  . Highest education level: Not on file  Occupational History  . Not on file  Tobacco Use  . Smoking status: Never Smoker  . Smokeless tobacco: Never Used  Vaping Use  . Vaping Use: Never used  Substance and Sexual Activity  . Alcohol use: No    Alcohol/week: 0.0 standard drinks  . Drug use: No  . Sexual activity: Yes    Partners: Male    Birth control/protection: I.U.D.  Other Topics Concern  . Not on file  Social History Narrative  . Not on file   Social Determinants of Health   Financial Resource Strain: Not on file  Food Insecurity: Not on file  Transportation Needs: Not on file  Physical Activity: Not on file  Stress: Not on file  Social Connections: Not on file  Intimate Partner Violence: Not on file     Constitutional: Pt reports abnormal weight gain. Denies fever, malaise, fatigue, headache.  Respiratory: Denies difficulty breathing, shortness of breath, cough or sputum production.   Cardiovascular: Denies chest pain, chest tightness, palpitations or swelling in  the hands or feet.  Gastrointestinal: Denies abdominal pain, bloating, constipation, diarrhea or blood in the stool.  GU: Denies urgency, frequency, pain with urination, burning sensation, blood in urine, odor or discharge. Musculoskeletal: Pt reports chronic left side low back pain. Denies decrease in range of motion, difficulty with gait, or joint swelling.   No other specific complaints in a complete review of systems (except as listed in HPI above).  Objective:   Physical Exam BP 126/82   Pulse 93   Temp (!) 97.1 F (36.2 C) (Temporal)   Ht 5\' 5"  (1.651 m)   Wt 217 lb (98.4 kg)   SpO2 98%   BMI 36.11 kg/m   Wt  Readings from Last 3 Encounters:  03/09/20 191 lb (86.6 kg)  02/10/20 193 lb (87.5 kg)  01/13/20 213 lb (96.6 kg)    General: Appears her stated age, obese, in NAD. SHEENT: Head: normal shape and size; Eyes: sclera white and EOMs intact; Cardiovascular: Normal rate and rhythm. S1,S2 noted.  No murmur, rubs or gallops noted. Pulmonary/Chest: Normal effort and positive vesicular breath sounds. No respiratory distress. No wheezes, rales or ronchi noted.  Musculoskeletal: Normal flexion, extension, rotation and lateral bending of the spine. Bony tenderness noted over the spine and left SI joint. Strength 5/5 BLE. No difficulty with gait.  Neurological: Alert and oriented.    BMET    Component Value Date/Time   NA 137 01/13/2020 1826   NA 137 10/23/2018 1044   K 4.5 01/13/2020 1826   CL 107 01/13/2020 1826   CO2 19 (L) 01/13/2020 1826   GLUCOSE 73 01/13/2020 1826   BUN 8 01/13/2020 1826   BUN 10 10/23/2018 1044   CREATININE 0.49 01/13/2020 1826   CALCIUM 9.0 01/13/2020 1826   GFRNONAA >60 01/13/2020 1826   GFRAA >60 01/13/2020 1826    Lipid Panel     Component Value Date/Time   CHOL 257 (H) 12/30/2019 1510   CHOL 167 10/23/2018 1044   TRIG 170.0 (H) 12/30/2019 1510   HDL 70.80 12/30/2019 1510   HDL 52 10/23/2018 1044   CHOLHDL 4 12/30/2019 1510   VLDL 34.0 12/30/2019 1510   LDLCALC 152 (H) 12/30/2019 1510   LDLCALC 103 (H) 10/23/2018 1044    CBC    Component Value Date/Time   WBC 13.8 (H) 01/14/2020 0436   RBC 4.02 01/14/2020 0436   HGB 10.5 (L) 01/14/2020 0436   HGB 11.3 10/28/2019 0823   HCT 34.0 (L) 01/14/2020 0436   HCT 34.6 10/28/2019 0823   PLT 236 01/14/2020 0436   PLT 242 10/28/2019 0823   MCV 84.6 01/14/2020 0436   MCV 95 10/28/2019 0823   MCH 26.1 01/14/2020 0436   MCHC 30.9 01/14/2020 0436   RDW 14.1 01/14/2020 0436   RDW 11.6 (L) 10/28/2019 0823   LYMPHSABS 1.8 07/07/2019 1535   MONOABS 0.9 04/26/2015 1053   EOSABS 0.1 07/07/2019 1535   BASOSABS  0.0 07/07/2019 1535    Hgb A1C Lab Results  Component Value Date   HGBA1C 5.0 08/21/2019             Assessment & Plan:   Abnormal Weight Gain:  Will check TSH and A1C today Discussed 1200 calorie restricted diet, eating every 3 hours, consuming 64 oz of water daily and consuming a keto based diet Continue exercise If labs normal, will start Phentermine 15 mg PO daily- discussed common side effects  Chronic Back Pain:  Likely exacerbated by weight Xray lumbar spine today  Encouraged daily stretching, core strengthening and exercise for weight loss  Return precautions discussed  Nicki Reaper, NP  This visit occurred during the SARS-CoV-2 public health emergency.  Safety protocols were in place, including screening questions prior to the visit, additional usage of staff PPE, and extensive cleaning of exam room while observing appropriate contact time as indicated for disinfecting solutions.

## 2020-12-07 ENCOUNTER — Encounter: Payer: Self-pay | Admitting: Internal Medicine

## 2020-12-07 DIAGNOSIS — E039 Hypothyroidism, unspecified: Secondary | ICD-10-CM

## 2020-12-07 LAB — HEMOGLOBIN A1C: Hgb A1c MFr Bld: 5.5 % (ref 4.6–6.5)

## 2020-12-07 LAB — TSH: TSH: 9.95 u[IU]/mL — ABNORMAL HIGH (ref 0.35–4.50)

## 2020-12-07 MED ORDER — LEVOTHYROXINE SODIUM 25 MCG PO TABS: 25.0000 ug | ORAL_TABLET | Freq: Every day | ORAL | 0 refills | Status: DC

## 2020-12-09 ENCOUNTER — Other Ambulatory Visit: Payer: Self-pay | Admitting: Internal Medicine

## 2020-12-09 MED ORDER — MELOXICAM 7.5 MG PO TABS
7.5000 mg | ORAL_TABLET | Freq: Every day | ORAL | 2 refills | Status: DC
Start: 2020-12-09 — End: 2021-05-30

## 2020-12-29 ENCOUNTER — Other Ambulatory Visit (INDEPENDENT_AMBULATORY_CARE_PROVIDER_SITE_OTHER): Payer: BC Managed Care – PPO

## 2020-12-29 ENCOUNTER — Other Ambulatory Visit: Payer: BC Managed Care – PPO

## 2020-12-29 ENCOUNTER — Other Ambulatory Visit: Payer: Self-pay

## 2020-12-29 DIAGNOSIS — E039 Hypothyroidism, unspecified: Secondary | ICD-10-CM | POA: Diagnosis not present

## 2020-12-29 LAB — T4, FREE: Free T4: 0.72 ng/dL (ref 0.60–1.60)

## 2020-12-29 LAB — TSH: TSH: 4.88 u[IU]/mL — ABNORMAL HIGH (ref 0.35–4.50)

## 2021-01-16 ENCOUNTER — Ambulatory Visit (INDEPENDENT_AMBULATORY_CARE_PROVIDER_SITE_OTHER): Payer: BC Managed Care – PPO | Admitting: Internal Medicine

## 2021-01-16 ENCOUNTER — Other Ambulatory Visit: Payer: Self-pay

## 2021-01-16 ENCOUNTER — Encounter: Payer: Self-pay | Admitting: Internal Medicine

## 2021-01-16 DIAGNOSIS — G43C0 Periodic headache syndromes in child or adult, not intractable: Secondary | ICD-10-CM | POA: Diagnosis not present

## 2021-01-16 DIAGNOSIS — E039 Hypothyroidism, unspecified: Secondary | ICD-10-CM

## 2021-01-16 MED ORDER — LEVOTHYROXINE SODIUM 50 MCG PO TABS: 50.0000 ug | ORAL_TABLET | Freq: Every day | ORAL | 1 refills | Status: DC

## 2021-01-16 MED ORDER — CYCLOBENZAPRINE HCL 10 MG PO TABS: 10.0000 mg | ORAL_TABLET | Freq: Three times a day (TID) | ORAL | 0 refills | Status: DC | PRN

## 2021-01-16 NOTE — Assessment & Plan Note (Signed)
Will increase Levothyroxine to 50 mcg daily  RTC in 1 month for lab only TSH, no need to repeat Free T4 at this time

## 2021-01-16 NOTE — Progress Notes (Signed)
Subjective:    Patient ID: Danielle Morgan, female    DOB: 06-07-1996, 25 y.o.   MRN: [id number]  HPI  Patient presents the clinic today to follow-up on hypothyroidism. Her last TSH was 4.88 with a normal Free T4. She reports chronic fatigue and difficulty losing weight. She has lost 4 lbs in the last 2 months but would like to lose another 43 lbs. She is taking her Levothyroxine as prescribed but reports she has run out.  She has not really made any specific changes in her diet. She is not currently calorie counting. We had discussed Phentermine at her last visit but want to get her thyroid controlled first. Also, her blood pressure is borderline today at 130/78.  She needs a refill on her Cyclobenzaprine which she takes as needed for headaches. She reports this is occurring about 2-3 times per month. She takes Meloxicam daily as well but does not need a refill on this.  Review of Systems      Past Medical History:  Diagnosis Date  . Frequent headaches     Current Outpatient Medications  Medication Sig Dispense Refill  . cyclobenzaprine (FLEXERIL) 10 MG tablet Take 1 tablet (10 mg total) by mouth every 8 (eight) hours as needed for muscle spasms. (Patient taking differently: Take 10 mg by mouth every 8 (eight) hours as needed for muscle spasms. Spasms.) 30 tablet 3  . meloxicam (MOBIC) 7.5 MG tablet Take 1 tablet (7.5 mg total) by mouth daily. 30 tablet 2  . levothyroxine (SYNTHROID) 25 MCG tablet Take 1 tablet (25 mcg total) by mouth daily before breakfast. (Patient not taking: Reported on 01/16/2021) 30 tablet 0   No current facility-administered medications for this visit.    No Known Allergies  Family History  Problem Relation Age of Onset  . Arthritis Mother   . Bipolar disorder Father   . Cancer Paternal Grandmother        Lung  . ADD / ADHD Sister   . ADD / ADHD Brother   . Heart disease Maternal Uncle   . Arthritis Maternal Grandfather   . Hypertension Maternal  Grandfather   . Alcohol abuse Maternal Grandfather     Social History   Socioeconomic History  . Marital status: Married    Spouse name: Not on file  . Number of children: Not on file  . Years of education: Not on file  . Highest education level: Not on file  Occupational History  . Not on file  Tobacco Use  . Smoking status: Never Smoker  . Smokeless tobacco: Never Used  Vaping Use  . Vaping Use: Never used  Substance and Sexual Activity  . Alcohol use: No    Alcohol/week: 0.0 standard drinks  . Drug use: No  . Sexual activity: Yes    Partners: Male    Birth control/protection: I.U.D.  Other Topics Concern  . Not on file  Social History Narrative  . Not on file   Social Determinants of Health   Financial Resource Strain: Not on file  Food Insecurity: Not on file  Transportation Needs: Not on file  Physical Activity: Not on file  Stress: Not on file  Social Connections: Not on file  Intimate Partner Violence: Not on file     Constitutional: Pt reports intermittent headaches, chronic fatigue and difficulty losing weight. Denies fever, malaise, headache.  HEENT: Denies eye pain, eye redness, ear pain, ringing in the ears, wax buildup, runny nose, nasal congestion, bloody nose, or  sore throat. Respiratory: Denies difficulty breathing, shortness of breath, cough or sputum production.   Cardiovascular: Denies chest pain, chest tightness, palpitations or swelling in the hands or feet.  Gastrointestinal: Denies abdominal pain, bloating, constipation, diarrhea or blood in the stool.  Skin: Denies redness, rashes, lesions or ulcercations.  Neurological: Denies dizziness, difficulty with memory, difficulty with speech or problems with balance and coordination.  Psych: Denies anxiety, depression, SI/HI.  No other specific complaints in a complete review of systems (except as listed in HPI above).  Objective:   Physical Exam   BP 130/78 (BP Location: Right Arm, Patient  Position: Sitting, Cuff Size: Normal)   Pulse 72   Temp 97.8 F (36.6 C) (Oral)   Wt 213 lb (96.6 kg)   SpO2 99%   BMI 35.45 kg/m  Wt Readings from Last 3 Encounters:  01/16/21 213 lb (96.6 kg)  12/06/20 217 lb (98.4 kg)  03/09/20 191 lb (86.6 kg)    General: Appears her stated age, obese, in NAD. Skin: Warm, dry and intact. No rashes or dry skin noted. HEENT: Head: normal shape and size; Eyes: sclera white and EOMs intact;  Neck:  Neck supple, trachea midline. No thyromegaly present.  Cardiovascular: Normal rate. Pulmonary/Chest: Normal effort. Neurological: Alert and oriented.    BMET    Component Value Date/Time   NA 137 01/13/2020 1826   NA 137 10/23/2018 1044   K 4.5 01/13/2020 1826   CL 107 01/13/2020 1826   CO2 19 (L) 01/13/2020 1826   GLUCOSE 73 01/13/2020 1826   BUN 8 01/13/2020 1826   BUN 10 10/23/2018 1044   CREATININE 0.49 01/13/2020 1826   CALCIUM 9.0 01/13/2020 1826   GFRNONAA >60 01/13/2020 1826   GFRAA >60 01/13/2020 1826    Lipid Panel     Component Value Date/Time   CHOL 257 (H) 12/30/2019 1510   CHOL 167 10/23/2018 1044   TRIG 170.0 (H) 12/30/2019 1510   HDL 70.80 12/30/2019 1510   HDL 52 10/23/2018 1044   CHOLHDL 4 12/30/2019 1510   VLDL 34.0 12/30/2019 1510   LDLCALC 152 (H) 12/30/2019 1510   LDLCALC 103 (H) 10/23/2018 1044    CBC    Component Value Date/Time   WBC 13.8 (H) 01/14/2020 0436   RBC 4.02 01/14/2020 0436   HGB 10.5 (L) 01/14/2020 0436   HGB 11.3 10/28/2019 0823   HCT 34.0 (L) 01/14/2020 0436   HCT 34.6 10/28/2019 0823   PLT 236 01/14/2020 0436   PLT 242 10/28/2019 0823   MCV 84.6 01/14/2020 0436   MCV 95 10/28/2019 0823   MCH 26.1 01/14/2020 0436   MCHC 30.9 01/14/2020 0436   RDW 14.1 01/14/2020 0436   RDW 11.6 (L) 10/28/2019 0823   LYMPHSABS 1.8 07/07/2019 1535   MONOABS 0.9 04/26/2015 1053   EOSABS 0.1 07/07/2019 1535   BASOSABS 0.0 07/07/2019 1535    Hgb A1C Lab Results  Component Value Date   HGBA1C  5.5 12/06/2020          Assessment & Plan:  Difficulty Losing Weight:  Will increase Levothyroxine to 50 mcg daily Advised her to calorie count for the next month, consume 1200 calories per day Encouraged adequate water intake  Nicki Reaper, NP This visit occurred during the SARS-CoV-2 public health emergency.  Safety protocols were in place, including screening questions prior to the visit, additional usage of staff PPE, and extensive cleaning of exam room while observing appropriate contact time as indicated for disinfecting solutions.

## 2021-01-16 NOTE — Assessment & Plan Note (Signed)
Continue Meloxicam Cyclobenzaprine refilled todya

## 2021-01-16 NOTE — Patient Instructions (Signed)
Calorie Counting for Weight Loss Calories are units of energy. Your body needs a certain number of calories from food to keep going throughout the day. When you eat or drink more calories than your body needs, your body stores the extra calories mostly as fat. When you eat or drink fewer calories than your body needs, your body burns fat to get the energy it needs. Calorie counting means keeping track of how many calories you eat and drink each day. Calorie counting can be helpful if you need to lose weight. If you eat fewer calories than your body needs, you should lose weight. Ask your health care provider what a healthy weight is for you. For calorie counting to work, you will need to eat the right number of calories each day to lose a healthy amount of weight per week. A dietitian can help you figure out how many calories you need in a day and will suggest ways to reach your calorie goal.  A healthy amount of weight to lose each week is usually 1-2 lb (0.5-0.9 kg). This usually means that your daily calorie intake should be reduced by 500-750 calories.  Eating 1,200-1,500 calories a day can help most women lose weight.  Eating 1,500-1,800 calories a day can help most men lose weight. What do I need to know about calorie counting? Work with your health care provider or dietitian to determine how many calories you should get each day. To meet your daily calorie goal, you will need to:  Find out how many calories are in each food that you would like to eat. Try to do this before you eat.  Decide how much of the food you plan to eat.  Keep a food log. Do this by writing down what you ate and how many calories it had. To successfully lose weight, it is important to balance calorie counting with a healthy lifestyle that includes regular activity. Where do I find calorie information? The number of calories in a food can be found on a Nutrition Facts label. If a food does not have a Nutrition Facts  label, try to look up the calories online or ask your dietitian for help. Remember that calories are listed per serving. If you choose to have more than one serving of a food, you will have to multiply the calories per serving by the number of servings you plan to eat. For example, the label on a package of bread might say that a serving size is 1 slice and that there are 90 calories in a serving. If you eat 1 slice, you will have eaten 90 calories. If you eat 2 slices, you will have eaten 180 calories.   How do I keep a food log? After each time that you eat, record the following in your food log as soon as possible:  What you ate. Be sure to include toppings, sauces, and other extras on the food.  How much you ate. This can be measured in cups, ounces, or number of items.  How many calories were in each food and drink.  The total number of calories in the food you ate. Keep your food log near you, such as in a pocket-sized notebook or on an app or website on your mobile phone. Some programs will calculate calories for you and show you how many calories you have left to meet your daily goal. What are some portion-control tips?  Know how many calories are in a serving. This will   help you know how many servings you can have of a certain food.  Use a measuring cup to measure serving sizes. You could also try weighing out portions on a kitchen scale. With time, you will be able to estimate serving sizes for some foods.  Take time to put servings of different foods on your favorite plates or in your favorite bowls and cups so you know what a serving looks like.  Try not to eat straight from a food's packaging, such as from a bag or box. Eating straight from the package makes it hard to see how much you are eating and can lead to overeating. Put the amount you would like to eat in a cup or on a plate to make sure you are eating the right portion.  Use smaller plates, glasses, and bowls for smaller  portions and to prevent overeating.  Try not to multitask. For example, avoid watching TV or using your computer while eating. If it is time to eat, sit down at a table and enjoy your food. This will help you recognize when you are full. It will also help you be more mindful of what and how much you are eating. What are tips for following this plan? Reading food labels  Check the calorie count compared with the serving size. The serving size may be smaller than what you are used to eating.  Check the source of the calories. Try to choose foods that are high in protein, fiber, and vitamins, and low in saturated fat, trans fat, and sodium. Shopping  Read nutrition labels while you shop. This will help you make healthy decisions about which foods to buy.  Pay attention to nutrition labels for low-fat or fat-free foods. These foods sometimes have the same number of calories or more calories than the full-fat versions. They also often have added sugar, starch, or salt to make up for flavor that was removed with the fat.  Make a grocery list of lower-calorie foods and stick to it. Cooking  Try to cook your favorite foods in a healthier way. For example, try baking instead of frying.  Use low-fat dairy products. Meal planning  Use more fruits and vegetables. One-half of your plate should be fruits and vegetables.  Include lean proteins, such as chicken, turkey, and fish. Lifestyle Each week, aim to do one of the following:  150 minutes of moderate exercise, such as walking.  75 minutes of vigorous exercise, such as running. General information  Know how many calories are in the foods you eat most often. This will help you calculate calorie counts faster.  Find a way of tracking calories that works for you. Get creative. Try different apps or programs if writing down calories does not work for you. What foods should I eat?  Eat nutritious foods. It is better to have a nutritious,  high-calorie food, such as an avocado, than a food with few nutrients, such as a bag of potato chips.  Use your calories on foods and drinks that will fill you up and will not leave you hungry soon after eating. ? Examples of foods that fill you up are nuts and nut butters, vegetables, lean proteins, and high-fiber foods such as whole grains. High-fiber foods are foods with more than 5 g of fiber per serving.  Pay attention to calories in drinks. Low-calorie drinks include water and unsweetened drinks. The items listed above may not be a complete list of foods and beverages you can eat.   Contact a dietitian for more information.   What foods should I limit? Limit foods or drinks that are not good sources of vitamins, minerals, or protein or that are high in unhealthy fats. These include:  Candy.  Other sweets.  Sodas, specialty coffee drinks, alcohol, and juice. The items listed above may not be a complete list of foods and beverages you should avoid. Contact a dietitian for more information. How do I count calories when eating out?  Pay attention to portions. Often, portions are much larger when eating out. Try these tips to keep portions smaller: ? Consider sharing a meal instead of getting your own. ? If you get your own meal, eat only half of it. Before you start eating, ask for a container and put half of your meal into it. ? When available, consider ordering smaller portions from the menu instead of full portions.  Pay attention to your food and drink choices. Knowing the way food is cooked and what is included with the meal can help you eat fewer calories. ? If calories are listed on the menu, choose the lower-calorie options. ? Choose dishes that include vegetables, fruits, whole grains, low-fat dairy products, and lean proteins. ? Choose items that are boiled, broiled, grilled, or steamed. Avoid items that are buttered, battered, fried, or served with cream sauce. Items labeled as  crispy are usually fried, unless stated otherwise. ? Choose water, low-fat milk, unsweetened iced tea, or other drinks without added sugar. If you want an alcoholic beverage, choose a lower-calorie option, such as a glass of wine or light beer. ? Ask for dressings, sauces, and syrups on the side. These are usually high in calories, so you should limit the amount you eat. ? If you want a salad, choose a garden salad and ask for grilled meats. Avoid extra toppings such as bacon, cheese, or fried items. Ask for the dressing on the side, or ask for olive oil and vinegar or lemon to use as dressing.  Estimate how many servings of a food you are given. Knowing serving sizes will help you be aware of how much food you are eating at restaurants. Where to find more information  Centers for Disease Control and Prevention: www.cdc.gov  [place] Department of Agriculture: myplate.gov Summary  Calorie counting means keeping track of how many calories you eat and drink each day. If you eat fewer calories than your body needs, you should lose weight.  A healthy amount of weight to lose per week is usually 1-2 lb (0.5-0.9 kg). This usually means reducing your daily calorie intake by 500-750 calories.  The number of calories in a food can be found on a Nutrition Facts label. If a food does not have a Nutrition Facts label, try to look up the calories online or ask your dietitian for help.  Use smaller plates, glasses, and bowls for smaller portions and to prevent overeating.  Use your calories on foods and drinks that will fill you up and not leave you hungry shortly after a meal. This information is not intended to replace advice given to you by your health care provider. Make sure you discuss any questions you have with your health care provider. Document Revised: 10/15/2019 Document Reviewed: 10/15/2019 Elsevier Patient Education  2021 [employer]  

## 2021-02-07 DIAGNOSIS — F4320 Adjustment disorder, unspecified: Secondary | ICD-10-CM | POA: Diagnosis not present

## 2021-02-14 ENCOUNTER — Other Ambulatory Visit: Payer: BC Managed Care – PPO

## 2021-02-14 ENCOUNTER — Telehealth: Payer: Self-pay

## 2021-02-14 ENCOUNTER — Ambulatory Visit: Payer: BC Managed Care – PPO | Admitting: Internal Medicine

## 2021-02-14 NOTE — Telephone Encounter (Signed)
ERROR

## 2021-02-16 DIAGNOSIS — F4320 Adjustment disorder, unspecified: Secondary | ICD-10-CM | POA: Diagnosis not present

## 2021-02-19 DIAGNOSIS — F4320 Adjustment disorder, unspecified: Secondary | ICD-10-CM | POA: Diagnosis not present

## 2021-02-22 ENCOUNTER — Encounter: Payer: Self-pay | Admitting: Internal Medicine

## 2021-02-22 ENCOUNTER — Other Ambulatory Visit: Payer: BC Managed Care – PPO

## 2021-02-22 ENCOUNTER — Other Ambulatory Visit: Payer: Self-pay | Admitting: Internal Medicine

## 2021-02-22 DIAGNOSIS — E039 Hypothyroidism, unspecified: Secondary | ICD-10-CM | POA: Diagnosis not present

## 2021-02-23 LAB — TSH: TSH: 6.83 mIU/L — ABNORMAL HIGH

## 2021-02-26 DIAGNOSIS — F4320 Adjustment disorder, unspecified: Secondary | ICD-10-CM | POA: Diagnosis not present

## 2021-03-05 DIAGNOSIS — F4320 Adjustment disorder, unspecified: Secondary | ICD-10-CM | POA: Diagnosis not present

## 2021-03-06 MED ORDER — LEVOTHYROXINE SODIUM 75 MCG PO TABS: 75.0000 ug | ORAL_TABLET | Freq: Every day | ORAL | 0 refills | Status: DC

## 2021-03-12 DIAGNOSIS — F4320 Adjustment disorder, unspecified: Secondary | ICD-10-CM | POA: Diagnosis not present

## 2021-03-19 DIAGNOSIS — F4320 Adjustment disorder, unspecified: Secondary | ICD-10-CM | POA: Diagnosis not present

## 2021-04-02 DIAGNOSIS — F4320 Adjustment disorder, unspecified: Secondary | ICD-10-CM | POA: Diagnosis not present

## 2021-04-16 DIAGNOSIS — F4320 Adjustment disorder, unspecified: Secondary | ICD-10-CM | POA: Diagnosis not present

## 2021-04-19 ENCOUNTER — Other Ambulatory Visit: Payer: Self-pay | Admitting: Internal Medicine

## 2021-04-19 NOTE — Telephone Encounter (Signed)
Requested medications are due for refill today.  yes  Requested medications are on the active medications list.  yes  Last refill. 03/06/2021  Future visit scheduled.   no  Notes to clinic.  Patient has abnormal test results. Please advise.

## 2021-04-20 ENCOUNTER — Other Ambulatory Visit: Payer: Self-pay

## 2021-04-20 DIAGNOSIS — E039 Hypothyroidism, unspecified: Secondary | ICD-10-CM

## 2021-04-20 NOTE — Telephone Encounter (Signed)
She needs lab only appt for TSH. Is she out of medicine?

## 2021-04-21 ENCOUNTER — Other Ambulatory Visit: Payer: BC Managed Care – PPO

## 2021-04-21 DIAGNOSIS — E039 Hypothyroidism, unspecified: Secondary | ICD-10-CM | POA: Diagnosis not present

## 2021-04-22 LAB — TSH: TSH: 3.82 mIU/L

## 2021-04-23 ENCOUNTER — Encounter: Payer: Self-pay | Admitting: Internal Medicine

## 2021-04-27 NOTE — Telephone Encounter (Signed)
Pt called and is requesting to have a response. Please advise.

## 2021-04-30 MED ORDER — PHENTERMINE HCL 15 MG PO CAPS: 15.0000 mg | ORAL_CAPSULE | ORAL | 0 refills | Status: DC

## 2021-05-14 DIAGNOSIS — F4320 Adjustment disorder, unspecified: Secondary | ICD-10-CM | POA: Diagnosis not present

## 2021-05-30 ENCOUNTER — Other Ambulatory Visit: Payer: Self-pay

## 2021-05-30 ENCOUNTER — Encounter: Payer: Self-pay | Admitting: Internal Medicine

## 2021-05-30 ENCOUNTER — Ambulatory Visit (INDEPENDENT_AMBULATORY_CARE_PROVIDER_SITE_OTHER): Payer: BC Managed Care – PPO | Admitting: Internal Medicine

## 2021-05-30 VITALS — BP 108/77 | HR 70 | Temp 97.7°F | Resp 17 | Ht 65.0 in | Wt 211.4 lb

## 2021-05-30 DIAGNOSIS — E039 Hypothyroidism, unspecified: Secondary | ICD-10-CM | POA: Diagnosis not present

## 2021-05-30 DIAGNOSIS — E6609 Other obesity due to excess calories: Secondary | ICD-10-CM | POA: Insufficient documentation

## 2021-05-30 DIAGNOSIS — Z6835 Body mass index (BMI) 35.0-35.9, adult: Secondary | ICD-10-CM | POA: Diagnosis not present

## 2021-05-30 DIAGNOSIS — Z6832 Body mass index (BMI) 32.0-32.9, adult: Secondary | ICD-10-CM | POA: Insufficient documentation

## 2021-05-30 DIAGNOSIS — E663 Overweight: Secondary | ICD-10-CM | POA: Insufficient documentation

## 2021-05-30 DIAGNOSIS — E66811 Obesity, class 1: Secondary | ICD-10-CM | POA: Insufficient documentation

## 2021-05-30 MED ORDER — LEVOTHYROXINE SODIUM 75 MCG PO TABS: 75.0000 ug | ORAL_TABLET | Freq: Every day | ORAL | 0 refills | Status: DC

## 2021-05-30 MED ORDER — PHENTERMINE HCL 37.5 MG PO CAPS: 37.5000 mg | ORAL_CAPSULE | ORAL | 1 refills | Status: DC

## 2021-05-30 NOTE — Progress Notes (Signed)
Subjective:    Patient ID: Danielle Morgan, female    DOB: 13-Apr-1996, 25 y.o.   MRN: [id number]  HPI  Pt presents to the clinic today today for follow up abnormal weight gain and medication refill. She was started on Phentermine 04/30/21. Her starting weight was 213 lbs with a BMI of 35.45. She has been taking the medication as prescribed, but feels like the medication is wearing off by lunchtime. Her weight today is 211 lbs with a BMI of 35.18.  Breakfast: Nothing Lunch: Chicken nuggets or peanut butter Dinner: protein with veggies Snacks: chips or something sweet  Exercise: Walking intermittently.  She also needs a refill of Levothyroxine today.  Her recent TSH was normal.  Review of Systems  Past Medical History:  Diagnosis Date   Frequent headaches     Current Outpatient Medications  Medication Sig Dispense Refill   levothyroxine (SYNTHROID) 75 MCG tablet TAKE 1 TABLET BY MOUTH EVERY DAY 30 tablet 3   cyclobenzaprine (FLEXERIL) 10 MG tablet Take 1 tablet (10 mg total) by mouth every 8 (eight) hours as needed for muscle spasms. Spasms. 30 tablet 0   meloxicam (MOBIC) 7.5 MG tablet Take 1 tablet (7.5 mg total) by mouth daily. 30 tablet 2   phentermine 15 MG capsule Take 1 capsule (15 mg total) by mouth every morning. 30 capsule 0   No current facility-administered medications for this visit.    No Known Allergies  Family History  Problem Relation Age of Onset   Arthritis Mother    Bipolar disorder Father    Cancer Paternal Grandmother        Lung   ADD / ADHD Sister    ADD / ADHD Brother    Heart disease Maternal Uncle    Arthritis Maternal Grandfather    Hypertension Maternal Grandfather    Alcohol abuse Maternal Grandfather     Social History   Socioeconomic History   Marital status: Married    Spouse name: Not on file   Number of children: Not on file   Years of education: Not on file   Highest education level: Not on file  Occupational History   Not  on file  Tobacco Use   Smoking status: Never   Smokeless tobacco: Never  Vaping Use   Vaping Use: Never used  Substance and Sexual Activity   Alcohol use: No    Alcohol/week: 0.0 standard drinks   Drug use: No   Sexual activity: Yes    Partners: Male    Birth control/protection: I.U.D.  Other Topics Concern   Not on file  Social History Narrative   Not on file   Social Determinants of Health   Financial Resource Strain: Not on file  Food Insecurity: Not on file  Transportation Needs: Not on file  Physical Activity: Not on file  Stress: Not on file  Social Connections: Not on file  Intimate Partner Violence: Not on file     Constitutional: Denies fever, malaise, fatigue, headache or abrupt weight changes.  Respiratory: Denies difficulty breathing, shortness of breath, cough or sputum production.   Cardiovascular: Denies chest pain, chest tightness, palpitations or swelling in the hands or feet.  Neurological: Denies dizziness, difficulty with memory, difficulty with speech or problems with balance and coordination.    No other specific complaints in a complete review of systems (except as listed in HPI above).     Objective:   Physical Exam  BP 108/77 (BP Location: Right Arm, Patient Position: Sitting,  Cuff Size: Normal)   Pulse 70   Temp 97.7 F (36.5 C) (Temporal)   Resp 17   Ht 5\' 5"  (1.651 m)   Wt 211 lb 6.4 oz (95.9 kg)   LMP 05/20/2021   SpO2 100%   BMI 35.18 kg/m   Wt Readings from Last 3 Encounters:  01/16/21 213 lb (96.6 kg)  12/06/20 217 lb (98.4 kg)  03/09/20 191 lb (86.6 kg)    General: Appears her stated age, obese, in NAD. Cardiovascular: Normal rate and rhythm. S1,S2 noted.  No murmur, rubs or gallops noted.  Pulmonary/Chest: Normal effort and positive vesicular breath sounds. No respiratory distress. No wheezes, rales or ronchi noted.  Neurological: Alert and oriented.    BMET    Component Value Date/Time   NA 137 01/13/2020 1826    NA 137 10/23/2018 1044   K 4.5 01/13/2020 1826   CL 107 01/13/2020 1826   CO2 19 (L) 01/13/2020 1826   GLUCOSE 73 01/13/2020 1826   BUN 8 01/13/2020 1826   BUN 10 10/23/2018 1044   CREATININE 0.49 01/13/2020 1826   CALCIUM 9.0 01/13/2020 1826   GFRNONAA >60 01/13/2020 1826   GFRAA >60 01/13/2020 1826    Lipid Panel     Component Value Date/Time   CHOL 257 (H) 12/30/2019 1510   CHOL 167 10/23/2018 1044   TRIG 170.0 (H) 12/30/2019 1510   HDL 70.80 12/30/2019 1510   HDL 52 10/23/2018 1044   CHOLHDL 4 12/30/2019 1510   VLDL 34.0 12/30/2019 1510   LDLCALC 152 (H) 12/30/2019 1510   LDLCALC 103 (H) 10/23/2018 1044    CBC    Component Value Date/Time   WBC 13.8 (H) 01/14/2020 0436   RBC 4.02 01/14/2020 0436   HGB 10.5 (L) 01/14/2020 0436   HGB 11.3 10/28/2019 0823   HCT 34.0 (L) 01/14/2020 0436   HCT 34.6 10/28/2019 0823   PLT 236 01/14/2020 0436   PLT 242 10/28/2019 0823   MCV 84.6 01/14/2020 0436   MCV 95 10/28/2019 0823   MCH 26.1 01/14/2020 0436   MCHC 30.9 01/14/2020 0436   RDW 14.1 01/14/2020 0436   RDW 11.6 (L) 10/28/2019 0823   LYMPHSABS 1.8 07/07/2019 1535   MONOABS 0.9 04/26/2015 1053   EOSABS 0.1 07/07/2019 1535   BASOSABS 0.0 07/07/2019 1535    Hgb A1C Lab Results  Component Value Date   HGBA1C 5.5 12/06/2020           Assessment & Plan:    12/08/2020, NP This visit occurred during the SARS-CoV-2 public health emergency.  Safety protocols were in place, including screening questions prior to the visit, additional usage of staff PPE, and extensive cleaning of exam room while observing appropriate contact time as indicated for disinfecting solutions.

## 2021-05-30 NOTE — Assessment & Plan Note (Signed)
Encouraged high-protein, low-carb diet Encourage calorie counting to maintain less than or equal to 1200 cal daily Encourage 150 minutes of exercise weekly Will increase Phentermine to 37.5 mg daily  RTC in 2 months for weight check and med refill, sooner if needed

## 2021-05-30 NOTE — Patient Instructions (Signed)

## 2021-05-30 NOTE — Assessment & Plan Note (Signed)
Levothyroxine refilled today 

## 2021-06-11 DIAGNOSIS — F4320 Adjustment disorder, unspecified: Secondary | ICD-10-CM | POA: Diagnosis not present

## 2021-07-02 DIAGNOSIS — F4320 Adjustment disorder, unspecified: Secondary | ICD-10-CM | POA: Diagnosis not present

## 2021-07-03 ENCOUNTER — Other Ambulatory Visit: Payer: Self-pay

## 2021-07-03 ENCOUNTER — Ambulatory Visit (INDEPENDENT_AMBULATORY_CARE_PROVIDER_SITE_OTHER): Payer: BC Managed Care – PPO | Admitting: Obstetrics & Gynecology

## 2021-07-03 ENCOUNTER — Encounter: Payer: Self-pay | Admitting: Obstetrics & Gynecology

## 2021-07-03 ENCOUNTER — Other Ambulatory Visit (HOSPITAL_COMMUNITY)
Admission: RE | Admit: 2021-07-03 | Discharge: 2021-07-03 | Disposition: A | Discharge status: Home / Self Care | Payer: BC Managed Care – PPO | Source: Ambulatory Visit | Attending: Obstetrics & Gynecology | Admitting: Obstetrics & Gynecology

## 2021-07-03 VITALS — BP 141/88 | HR 85 | Wt 205.0 lb

## 2021-07-03 DIAGNOSIS — N819 Female genital prolapse, unspecified: Secondary | ICD-10-CM | POA: Diagnosis not present

## 2021-07-03 DIAGNOSIS — Z124 Encounter for screening for malignant neoplasm of cervix: Secondary | ICD-10-CM | POA: Insufficient documentation

## 2021-07-03 NOTE — Progress Notes (Signed)
   GYNECOLOGY OFFICE VISIT NOTE  History:   Danielle Morgan is a 25 y.o. [phone] here today for evaluation of possible prolapse.  Has pictures of noticing her cervix and vaginal tissue at her vaginal opening, especially while on toilet and when constipated.  No urinary symptoms, pressure symptoms. Constipation is due to medication she is taking. She denies any abnormal vaginal discharge, bleeding, pelvic pain or other concerns.    Past Medical History:  Diagnosis Date   Frequent headaches    Thyroid disease     Past Surgical History:  Procedure Laterality Date   NO PAST SURGERIES      The following portions of the patient's history were reviewed and updated as appropriate: allergies, current medications, past family history, past medical history, past social history, past surgical history and problem list.   Health Maintenance:  Normal pap on 05/26/2018.    Review of Systems:  Pertinent items noted in HPI and remainder of comprehensive ROS otherwise negative.  Physical Exam:  BP (!) 141/88   Pulse 85   Wt 205 lb (93 kg)   BMI 34.11 kg/m  CONSTITUTIONAL: Well-developed, well-nourished female in no acute distress.  HEENT:  Normocephalic, atraumatic. External right and left ear normal. No scleral icterus.  NECK: Normal range of motion, supple, no masses noted on observation SKIN: No rash noted. Not diaphoretic. No erythema. No pallor. MUSCULOSKELETAL: Normal range of motion. No edema noted. NEUROLOGIC: Alert and oriented to person, place, and time. Normal muscle tone coordination. No cranial nerve deficit noted. PSYCHIATRIC: Normal mood and affect. Normal behavior. Normal judgment and thought content. CARDIOVASCULAR: Normal heart rate noted RESPIRATORY: Effort and breath sounds normal, no problems with respiration noted ABDOMEN: No masses noted. No other overt distention noted.   PELVIC: Normal appearing external genitalia; normal urethral meatus; normal appearing vaginal mucosa and  cervix. IUD strings seen. Pap smear obtained.  No abnormal discharge noted.  Normal uterine size, no other palpable masses, no uterine or adnexal tenderness.  Grade 1 prolapse (uterovaginal) noted on Valsalva. Performed in the presence of a chaperone     Assessment and Plan:     1. Pap smear for cervical cancer screening - Cytology - PAP( [place]) done today, will follow up results and manage accordingly.  2. Female genital prolapse, unspecified type Grade 1 noted on exam. Reassured patient that is result of weakened pelvic floor  after two pregnancies and vaginal deliveries. No surgical intervention recommended for now as patient desires future fertility. Not symptomatic enough to require pessary intervention. Advised to take stool softeners etc, continue weight loss efforts and anything that reduces intraabdominal pressure.  If symptoms worsen, may need referral to Urogynecology.  Routine preventative health maintenance measures emphasized. Please refer to After Visit Summary for other counseling recommendations.   Return for any gynecologic concerns.    I spent 20 minutes dedicated to the care of this patient including pre-visit review of records, face to face time with the patient discussing her conditions and treatments and post visit orders.    Jaynie Collins, MD, FACOG Obstetrician & Gynecologist, [facility] for [employer], [facility]

## 2021-07-05 LAB — CYTOLOGY - PAP
Chlamydia: NEGATIVE
Comment: NEGATIVE
Comment: NORMAL
Diagnosis: NEGATIVE
Neisseria Gonorrhea: NEGATIVE

## 2021-07-16 DIAGNOSIS — F4320 Adjustment disorder, unspecified: Secondary | ICD-10-CM | POA: Diagnosis not present

## 2021-07-23 DIAGNOSIS — F4320 Adjustment disorder, unspecified: Secondary | ICD-10-CM | POA: Diagnosis not present

## 2021-08-25 ENCOUNTER — Other Ambulatory Visit: Payer: Self-pay | Admitting: Internal Medicine

## 2021-08-25 NOTE — Telephone Encounter (Signed)
Requested Prescriptions  Pending Prescriptions Disp Refills  . levothyroxine (SYNTHROID) 75 MCG tablet [Pharmacy Med Name: LEVOTHYROXINE 75 MCG TABLET] 90 tablet 0    Sig: TAKE 1 TABLET BY MOUTH EVERY DAY     Endocrinology:  Hypothyroid Agents Failed - 08/25/2021  1:29 AM      Failed - TSH needs to be rechecked within 3 months after an abnormal result. Refill until TSH is due.      Passed - TSH in normal range and within 360 days    TSH  Date Value Ref Range Status  04/21/2021 3.82 mIU/L Final    Comment:              Reference Range .           > or = 20 Years  0.40-4.50 .                Pregnancy Ranges           First trimester    0.26-2.66           Second trimester   0.55-2.73           Third trimester    0.43-2.91          Passed - Valid encounter within last 12 months    Recent Outpatient Visits          2 months ago Class 2 obesity due to excess calories without serious comorbidity with body mass index (BMI) of 35.0 to 35.9 in adult   [facility] [place], Salvadore Oxford, NP   7 months ago Acquired hypothyroidism   [facility] [place], Salvadore Oxford, NP      Future Appointments            In 6 days [place], Salvadore Oxford, NP [facility], [facility]

## 2021-08-31 ENCOUNTER — Ambulatory Visit (INDEPENDENT_AMBULATORY_CARE_PROVIDER_SITE_OTHER): Payer: BC Managed Care – PPO | Admitting: Internal Medicine

## 2021-08-31 ENCOUNTER — Other Ambulatory Visit: Payer: Self-pay

## 2021-08-31 ENCOUNTER — Encounter: Payer: Self-pay | Admitting: Internal Medicine

## 2021-08-31 VITALS — BP 95/68 | HR 72 | Temp 97.1°F | Resp 18 | Ht 65.0 in | Wt 196.8 lb

## 2021-08-31 DIAGNOSIS — E6609 Other obesity due to excess calories: Secondary | ICD-10-CM | POA: Diagnosis not present

## 2021-08-31 DIAGNOSIS — Z0001 Encounter for general adult medical examination with abnormal findings: Secondary | ICD-10-CM | POA: Diagnosis not present

## 2021-08-31 DIAGNOSIS — R7309 Other abnormal glucose: Secondary | ICD-10-CM | POA: Diagnosis not present

## 2021-08-31 DIAGNOSIS — E039 Hypothyroidism, unspecified: Secondary | ICD-10-CM

## 2021-08-31 DIAGNOSIS — Z6832 Body mass index (BMI) 32.0-32.9, adult: Secondary | ICD-10-CM | POA: Diagnosis not present

## 2021-08-31 MED ORDER — PHENTERMINE HCL 37.5 MG PO CAPS: 37.5000 mg | ORAL_CAPSULE | ORAL | 2 refills | Status: DC

## 2021-08-31 NOTE — Progress Notes (Signed)
Subjective:    Patient ID: Danielle Morgan, female    DOB: 02-Jan-1996, 25 y.o.   MRN: [id number]  HPI  Pt presents to the clinic today for her annual exam.  Flu: 06/2019 Tetanus: 10/2019 Covid:  never Pap smear: 06/2021 Dentist: biannually  Diet: She does eat meat. She consumes fruits and veggies. She does eat fried foods. She drinks mostly water. Exercise: None  Review of Systems     Past Medical History:  Diagnosis Date   Frequent headaches    Thyroid disease     Current Outpatient Medications  Medication Sig Dispense Refill   cyclobenzaprine (FLEXERIL) 10 MG tablet Take 1 tablet (10 mg total) by mouth every 8 (eight) hours as needed for muscle spasms. Spasms. 30 tablet 0   levothyroxine (SYNTHROID) 75 MCG tablet TAKE 1 TABLET BY MOUTH EVERY DAY 90 tablet 0   phentermine 37.5 MG capsule Take 1 capsule (37.5 mg total) by mouth every morning. 30 capsule 1   No current facility-administered medications for this visit.    No Known Allergies  Family History  Problem Relation Age of Onset   Arthritis Mother    Bipolar disorder Father    Cancer Paternal Grandmother        Lung   ADD / ADHD Sister    ADD / ADHD Brother    Heart disease Maternal Uncle    Arthritis Maternal Grandfather    Hypertension Maternal Grandfather    Alcohol abuse Maternal Grandfather     Social History   Socioeconomic History   Marital status: Married    Spouse name: Not on file   Number of children: Not on file   Years of education: Not on file   Highest education level: Not on file  Occupational History   Not on file  Tobacco Use   Smoking status: Never   Smokeless tobacco: Never  Vaping Use   Vaping Use: Never used  Substance and Sexual Activity   Alcohol use: Yes    Comment: occasionally   Drug use: No   Sexual activity: Yes    Partners: Male    Birth control/protection: I.U.D.  Other Topics Concern   Not on file  Social History Narrative   Not on file   Social  Determinants of Health   Financial Resource Strain: Not on file  Food Insecurity: Not on file  Transportation Needs: Not on file  Physical Activity: Not on file  Stress: Not on file  Social Connections: Not on file  Intimate Partner Violence: Not on file     Constitutional: Pt reports intermittent headaches. Denies fever, malaise, fatigue, or abrupt weight changes.  HEENT: Denies eye pain, eye redness, ear pain, ringing in the ears, wax buildup, runny nose, nasal congestion, bloody nose, or sore throat. Respiratory: Denies difficulty breathing, shortness of breath, cough or sputum production.   Cardiovascular: Denies chest pain, chest tightness, palpitations or swelling in the hands or feet.  Gastrointestinal: Denies abdominal pain, bloating, constipation, diarrhea or blood in the stool.  GU: Denies urgency, frequency, pain with urination, burning sensation, blood in urine, odor or discharge. Musculoskeletal: Denies decrease in range of motion, difficulty with gait, muscle pain or joint pain and swelling.  Skin: Denies redness, rashes, lesions or ulcercations.  Neurological: Denies dizziness, difficulty with memory, difficulty with speech or problems with balance and coordination.  Psych: Denies anxiety, depression, SI/HI.  No other specific complaints in a complete review of systems (except as listed in HPI above).  Objective:  Physical Exam  BP 95/68 (BP Location: Right Arm, Patient Position: Sitting, Cuff Size: Normal)    Pulse 72    Temp (!) 97.1 F (36.2 C) (Temporal)    Resp 18    Ht 5\' 5"  (1.651 m)    Wt 196 lb 12.8 oz (89.3 kg)    SpO2 100%    Breastfeeding No    BMI 32.75 kg/m   Wt Readings from Last 3 Encounters:  07/03/21 205 lb (93 kg)  05/30/21 211 lb 6.4 oz (95.9 kg)  01/16/21 213 lb (96.6 kg)    General: Appears her stated age, obese, in NAD. Skin: Warm, dry and intact. No rashes, lesions or ulcerations noted. HEENT: Head: normal shape and size; Eyes: sclera  white and EOMs intact;  Neck:  Neck supple, trachea midline. No masses, lumps or thyromegaly present.  Cardiovascular: Normal rate and rhythm. S1,S2 noted.  No murmur, rubs or gallops noted. No JVD or BLE edema.  Pulmonary/Chest: Normal effort and positive vesicular breath sounds. No respiratory distress. No wheezes, rales or ronchi noted.  Abdomen: Soft and nontender. Normal bowel sounds. No distention or masses noted. Liver, spleen and kidneys non palpable. Musculoskeletal: Strength 5/5 BUE/BLE. No difficulty with gait.  Neurological: Alert and oriented. Cranial nerves II-XII grossly intact. Coordination normal.  Psychiatric: Mood and affect normal. Behavior is normal. Judgment and thought content normal.     BMET    Component Value Date/Time   NA 137 01/13/2020 1826   NA 137 10/23/2018 1044   K 4.5 01/13/2020 1826   CL 107 01/13/2020 1826   CO2 19 (L) 01/13/2020 1826   GLUCOSE 73 01/13/2020 1826   BUN 8 01/13/2020 1826   BUN 10 10/23/2018 1044   CREATININE 0.49 01/13/2020 1826   CALCIUM 9.0 01/13/2020 1826   GFRNONAA >60 01/13/2020 1826   GFRAA >60 01/13/2020 1826    Lipid Panel     Component Value Date/Time   CHOL 257 (H) 12/30/2019 1510   CHOL 167 10/23/2018 1044   TRIG 170.0 (H) 12/30/2019 1510   HDL 70.80 12/30/2019 1510   HDL 52 10/23/2018 1044   CHOLHDL 4 12/30/2019 1510   VLDL 34.0 12/30/2019 1510   LDLCALC 152 (H) 12/30/2019 1510   LDLCALC 103 (H) 10/23/2018 1044    CBC    Component Value Date/Time   WBC 13.8 (H) 01/14/2020 0436   RBC 4.02 01/14/2020 0436   HGB 10.5 (L) 01/14/2020 0436   HGB 11.3 10/28/2019 0823   HCT 34.0 (L) 01/14/2020 0436   HCT 34.6 10/28/2019 0823   PLT 236 01/14/2020 0436   PLT 242 10/28/2019 0823   MCV 84.6 01/14/2020 0436   MCV 95 10/28/2019 0823   MCH 26.1 01/14/2020 0436   MCHC 30.9 01/14/2020 0436   RDW 14.1 01/14/2020 0436   RDW 11.6 (L) 10/28/2019 0823   LYMPHSABS 1.8 07/07/2019 1535   MONOABS 0.9 04/26/2015 1053    EOSABS 0.1 07/07/2019 1535   BASOSABS 0.0 07/07/2019 1535    Hgb A1C Lab Results  Component Value Date   HGBA1C 5.5 12/06/2020            Assessment & Plan:   Preventative Health Maintenance:  She declines flu shot today Tetanus UTD Encouraged her to get her covid vaccine Pap smear UTD Encouraged her to consume a balanced diet and exercise regimen Advised her to see a dentist annually Will check CBC, CMET, TSH, Free T4, Lipid and A1C today  RTC in 3 months, follow  up weight management Nicki Reaper, NP This visit occurred during the SARS-CoV-2 public health emergency.  Safety protocols were in place, including screening questions prior to the visit, additional usage of staff PPE, and extensive cleaning of exam room while observing appropriate contact time as indicated for disinfecting solutions.

## 2021-08-31 NOTE — Patient Instructions (Signed)

## 2021-08-31 NOTE — Assessment & Plan Note (Signed)
Encouraged diet and exercise for weight loss ?

## 2021-09-01 LAB — COMPLETE METABOLIC PANEL WITH GFR
AG Ratio: 1.4 (calc) (ref 1.0–2.5)
ALT: 14 U/L (ref 6–29)
AST: 17 U/L (ref 10–30)
Albumin: 4.3 g/dL (ref 3.6–5.1)
Alkaline phosphatase (APISO): 79 U/L (ref 31–125)
BUN: 9 mg/dL (ref 7–25)
CO2: 23 mmol/L (ref 20–32)
Calcium: 9.2 mg/dL (ref 8.6–10.2)
Chloride: 104 mmol/L (ref 98–110)
Creat: 0.66 mg/dL (ref 0.50–0.96)
Globulin: 3 g/dL (calc) (ref 1.9–3.7)
Glucose, Bld: 80 mg/dL (ref 65–99)
Potassium: 3.7 mmol/L (ref 3.5–5.3)
Sodium: 136 mmol/L (ref 135–146)
Total Bilirubin: 0.6 mg/dL (ref 0.2–1.2)
Total Protein: 7.3 g/dL (ref 6.1–8.1)
eGFR: 125 mL/min/{1.73_m2} (ref >=60)

## 2021-09-01 LAB — CBC
HCT: 39.4 % (ref 35.0–45.0)
Hemoglobin: 13.1 g/dL (ref 11.7–15.5)
MCH: 29.8 pg (ref 27.0–33.0)
MCHC: 33.2 g/dL (ref 32.0–36.0)
MCV: 89.7 fL (ref 80.0–100.0)
MPV: 11.1 fL (ref 7.5–12.5)
Platelets: 262 10*3/uL (ref 140–400)
RBC: 4.39 10*6/uL (ref 3.80–5.10)
RDW: 12.5 % (ref 11.0–15.0)
WBC: 8.7 10*3/uL (ref 3.8–10.8)

## 2021-09-01 LAB — HEMOGLOBIN A1C
Hgb A1c MFr Bld: 5.4 % of total Hgb (ref <5.7)
Mean Plasma Glucose: 108 mg/dL
eAG (mmol/L): 6 mmol/L

## 2021-09-01 LAB — LIPID PANEL
Cholesterol: 186 mg/dL (ref <200)
HDL: 49 mg/dL — ABNORMAL LOW (ref >=50)
LDL Cholesterol (Calc): 123 mg/dL (calc) — ABNORMAL HIGH
Non-HDL Cholesterol (Calc): 137 mg/dL (calc) — ABNORMAL HIGH (ref <130)
Total CHOL/HDL Ratio: 3.8 (calc) (ref <5.0)
Triglycerides: 58 mg/dL (ref <150)

## 2021-09-01 LAB — TSH: TSH: 5.08 mIU/L — ABNORMAL HIGH

## 2021-09-01 LAB — T4, FREE: Free T4: 1.1 ng/dL (ref 0.8–1.8)

## 2021-09-03 DIAGNOSIS — F411 Generalized anxiety disorder: Secondary | ICD-10-CM | POA: Diagnosis not present

## 2021-10-01 DIAGNOSIS — F411 Generalized anxiety disorder: Secondary | ICD-10-CM | POA: Diagnosis not present

## 2021-11-12 DIAGNOSIS — F411 Generalized anxiety disorder: Secondary | ICD-10-CM | POA: Diagnosis not present

## 2021-11-24 ENCOUNTER — Other Ambulatory Visit: Payer: Self-pay | Admitting: Internal Medicine

## 2021-11-26 MED ORDER — CYCLOBENZAPRINE HCL 10 MG PO TABS: 10.0000 mg | ORAL_TABLET | Freq: Three times a day (TID) | ORAL | 0 refills | Status: DC | PRN

## 2021-12-13 ENCOUNTER — Ambulatory Visit (INDEPENDENT_AMBULATORY_CARE_PROVIDER_SITE_OTHER): Payer: BC Managed Care – PPO | Admitting: Physician Assistant

## 2021-12-13 ENCOUNTER — Encounter: Payer: Self-pay | Admitting: Physician Assistant

## 2021-12-13 ENCOUNTER — Other Ambulatory Visit: Payer: Self-pay

## 2021-12-13 VITALS — BP 126/84 | HR 96 | Temp 97.5°F | Wt 189.0 lb

## 2021-12-13 DIAGNOSIS — N390 Urinary tract infection, site not specified: Secondary | ICD-10-CM | POA: Diagnosis not present

## 2021-12-13 DIAGNOSIS — R300 Dysuria: Secondary | ICD-10-CM | POA: Diagnosis not present

## 2021-12-13 DIAGNOSIS — B379 Candidiasis, unspecified: Secondary | ICD-10-CM

## 2021-12-13 DIAGNOSIS — T3695XA Adverse effect of unspecified systemic antibiotic, initial encounter: Secondary | ICD-10-CM

## 2021-12-13 DIAGNOSIS — R319 Hematuria, unspecified: Secondary | ICD-10-CM

## 2021-12-13 LAB — POCT URINALYSIS DIPSTICK
Bilirubin, UA: NEGATIVE
Glucose, UA: NEGATIVE
Ketones, UA: NEGATIVE
Nitrite, UA: NEGATIVE
Protein, UA: POSITIVE — AB
Spec Grav, UA: 1.02 (ref 1.010–1.025)
Urobilinogen, UA: 0.2 E.U./dL
pH, UA: 6 (ref 5.0–8.0)

## 2021-12-13 MED ORDER — FLUCONAZOLE 150 MG PO TABS: 150.0000 mg | ORAL_TABLET | Freq: Once | ORAL | 0 refills | Status: AC

## 2021-12-13 MED ORDER — NITROFURANTOIN MONOHYD MACRO 100 MG PO CAPS: 100.0000 mg | ORAL_CAPSULE | Freq: Two times a day (BID) | ORAL | 0 refills | Status: DC

## 2021-12-13 NOTE — Patient Instructions (Addendum)
Based on your symptoms and urinalysis it is likely that you have a UTI ?I am going to send in a script for an antibiotic called Macrobid 100 mg to be taken by mouth every 12 hours for five days- please complete the full course of medication unless instructed to stop ?We will keep you updated on the results of the urine culture once they are available and if there needs to be any changes to therapy ?Stay well hydrated while recovering from the UTI with plenty of water ?You can use Tylenol as needed to help with discomfort ? ?I have also sent in a script for Diflucan for you to take for potential antibiotic induced yeast infection  ?Take one and if you have still have symptoms after 3 days you can take the second one ? ?

## 2021-12-13 NOTE — Progress Notes (Signed)
? ? ?  ?    Acute Office Visit ? ? ?Patient: Danielle Morgan   DOB: 1996-06-15   26 y.o. Female  MRN: [id number] ?Visit Date: 12/13/2021 ? ?Today's healthcare provider: Oswaldo Conroy Jaskiran Pata, PA-C  ?Introduced myself to the patient as a Secondary school teacher and provided education on APPs in clinical practice.  ? ? ?CC: Dysuria and concern for UTI ? ? ? ?Subjective  ?  ?HPI  ? ?States yesterday she started having dysuria and a pink tinge to paper after wiping ?States she has developed a cramping sensation in her back  ?Reports urinary frequency ?Denies fever, chills, nausea, vomiting, flank pain ?States she has chronic lower back pain after the birth of her son so differentiating between a new pain and this is difficult ? ? ? ?Medications: ?Outpatient Medications Prior to Visit  ?Medication Sig  ? cyclobenzaprine (FLEXERIL) 10 MG tablet Take 1 tablet (10 mg total) by mouth every 8 (eight) hours as needed for muscle spasms. Spasms.  ? levothyroxine (SYNTHROID) 75 MCG tablet TAKE 1 TABLET BY MOUTH EVERY DAY  ? phentermine 37.5 MG capsule Take 1 capsule (37.5 mg total) by mouth every morning.  ? ?No facility-administered medications prior to visit.  ? ? ?Review of Systems  ?Constitutional:  Negative for chills, diaphoresis and fever.  ?Gastrointestinal:  Negative for diarrhea, nausea and vomiting.  ?Genitourinary:  Positive for dysuria, frequency and hematuria. Negative for decreased urine volume, difficulty urinating, enuresis, flank pain and vaginal pain.  ?Musculoskeletal:  Positive for back pain.  ?Neurological:  Negative for dizziness, light-headedness and headaches.  ? ? ?  Objective  ?  ?BP 126/84 (BP Location: Right Arm, Patient Position: Sitting, Cuff Size: Large)   Pulse 96   Temp (!) 97.5 ?F (36.4 ?C) (Temporal)   Wt 189 lb (85.7 kg)   SpO2 100%   BMI 31.45 kg/m?  ? ? ?Physical Exam ?Vitals reviewed.  ?Constitutional:   ?   Appearance: Normal appearance.  ?HENT:  ?   Head: Normocephalic and atraumatic.  ?Eyes:  ?   Extraocular  Movements: Extraocular movements intact.  ?   Conjunctiva/sclera: Conjunctivae normal.  ?Cardiovascular:  ?   Rate and Rhythm: Normal rate and regular rhythm.  ?   Pulses: Normal pulses.  ?   Heart sounds: Normal heart sounds.  ?Pulmonary:  ?   Effort: Pulmonary effort is normal.  ?   Breath sounds: Normal breath sounds.  ?Abdominal:  ?   General: Abdomen is flat. Bowel sounds are normal.  ?   Palpations: Abdomen is soft.  ?   Tenderness: There is no abdominal tenderness. There is no right CVA tenderness or left CVA tenderness.  ?Musculoskeletal:  ?   Cervical back: Normal range of motion and neck supple.  ?   Right lower leg: No edema.  ?   Left lower leg: No edema.  ?Neurological:  ?   General: No focal deficit present.  ?   Mental Status: She is alert and oriented to person, place, and time.  ?   GCS: GCS eye subscore is 4. GCS verbal subscore is 5. GCS motor subscore is 6.  ?Psychiatric:     ?   Attention and Perception: Attention normal.     ?   Mood and Affect: Mood normal.     ?   Speech: Speech normal.     ?   Behavior: Behavior normal. Behavior is cooperative.  ?  ? ? Latest Reference Range & Units 12/13/21 10:48  ?  Bilirubin, UA  Negative  ?Glucose Negative  Negative  ?Ketones, UA  Negative  ?Leukocytes,UA Negative  Large (3+) !  ?Nitrite, UA  Negative  ?pH, UA 5.0 - 8.0  6.0  ?Protein,UA Negative  Positive !  ?Specific Gravity, UA 1.010 - 1.025  1.020  ?Urobilinogen, UA 0.2 or 1.0 E.U./dL 0.2  ?RBC, UA  Large  ?!: Data is abnormal ? ?No results found for any visits on 12/13/21. ? Assessment & Plan  ?  ? ?Problem List Items Addressed This Visit   ?None ?Visit Diagnoses   ? ? Dysuria    -  Primary ?Acute, new problem  ?Associated with increased frequency and cramping sensation in her lower back ?UA was +leukocytes, +protein and RBCs  ? Relevant Orders  ? POCT urinalysis dipstick (Completed)  ? Urine Culture  ? Urinary tract infection with hematuria, site unspecified     ?Acute, new problem  ?UA indicates  likely UTI, urine culture ordered for ID and susceptibility testing ?Urine culture results to dictate management  ?Will initiate Macrobid 100 mg PO BID x 5 days to assist with resolution  ?Recommend staying well hydrated and use Tylenol as needed for pain/ discomfort ?Follow up as needed for persistent/ worsening symptoms  ? Relevant Medications  ? nitrofurantoin, macrocrystal-monohydrate, (MACROBID) 100 MG capsule  ? fluconazole (DIFLUCAN) 150 MG tablet  ? Antibiotic-induced yeast infection     ?Patient states she typically develops a yeast infection when she has to start an antibiotic and would like Diflucan to assist with this.   ? Relevant Medications  ? nitrofurantoin, macrocrystal-monohydrate, (MACROBID) 100 MG capsule  ? fluconazole (DIFLUCAN) 150 MG tablet  ? ?  ? ? ? ?No follow-ups on file. ? ? ?I, Adrienne Delay E Kennith Morss, PA-C, have reviewed all documentation for this visit. The documentation on 12/13/21 for the exam, diagnosis, procedures, and orders are all accurate and complete. ? ? ?[name], [name] MPH ? Family Practice ?[facility] ? ? ? ? ?No follow-ups on file.  ?   ? ? ? ? ?

## 2021-12-15 LAB — URINE CULTURE
MICRO NUMBER:: 13199335
SPECIMEN QUALITY:: ADEQUATE

## 2022-01-07 DIAGNOSIS — F411 Generalized anxiety disorder: Secondary | ICD-10-CM | POA: Diagnosis not present

## 2022-04-01 DIAGNOSIS — F411 Generalized anxiety disorder: Secondary | ICD-10-CM | POA: Diagnosis not present

## 2022-04-05 ENCOUNTER — Ambulatory Visit: Payer: Self-pay

## 2022-04-05 NOTE — Telephone Encounter (Signed)
Summary: poss uti   Pt called saying she has yeast inf or uti going on.  She ask to be see today or tomorrow.  There are no appts until next week.   Please advise  [phone]      Chief Complaint: itching and cottage cheese consistency to discharge and color of discharge is a pale green Symptoms: itching, and itching toward the end of urination, vaginal discharge Frequency: Sunday Pertinent Negatives: Patient denies abdominal pain, bloody discharge, fever Disposition: [] ED /[] Urgent Care (no appt availability in office) / [x] Appointment(In office/virtual)/ []  [facility]/ [] Home Care/ [] Refused Recommended Disposition /[] [facility]/ []  Follow-up with PCP Additional Notes: Appt accepted with Dr tomorrow. No available openings with PCP.  Reason for Disposition  [1] Symptoms of a yeast infection (i.e., itchy, white discharge, not bad smelling) AND [2] not improved > 3 days following Care Advice  Answer Assessment - Initial Assessment Questions 1. SYMPTOM: "What's the main symptom you're concerned about?" (e.g., pain, itching, dryness)     Itching and discharge very pale green 2. LOCATION: "Where is the  itching located?" (e.g., inside/outside, left/right)     Urethra and perineal area 3. ONSET: "When did the  sx  start?"     Sunday 4. PAIN: "Is there any pain?" If Yes, ask: "How bad is it?" (Scale: 1-10; mild, moderate, severe)   -  MILD (1-3): Doesn't interfere with normal activities.    -  MODERATE (4-7): Interferes with normal activities (e.g., work or school) or awakens from sleep.     -  SEVERE (8-10): Excruciating pain, unable to do any normal activities.     Occasionally in evening  5. ITCHING: "Is there any itching?" If Yes, ask: "How bad is it?" (Scale: 1-10; mild, moderate, severe)     Mild itching- itches more when urinating 6. CAUSE: "What do you think is causing the discharge?" "Have you had the same problem before? What happened then?"      Yeast infection and burning 7. OTHER SYMPTOMS: "Do you have any other symptoms?" (e.g., fever, itching, vaginal bleeding, pain with urination, injury to genital area, vaginal foreign body)     Itching discharge (cottage cheese)  Protocols used: Vaginal Symptoms-A-AH

## 2022-04-06 ENCOUNTER — Ambulatory Visit: Payer: BC Managed Care – PPO | Admitting: Family Medicine

## 2022-04-06 ENCOUNTER — Encounter: Payer: Self-pay | Admitting: Family Medicine

## 2022-04-06 VITALS — BP 120/56 | HR 77 | Ht 65.0 in | Wt 201.6 lb

## 2022-04-06 DIAGNOSIS — B3731 Acute candidiasis of vulva and vagina: Secondary | ICD-10-CM | POA: Diagnosis not present

## 2022-04-06 MED ORDER — FLUCONAZOLE 150 MG PO TABS: ORAL_TABLET | ORAL | 1 refills | Status: DC

## 2022-04-06 NOTE — Patient Instructions (Addendum)
Thank you for coming to the office today.  Start Diflucan (Fluconazole) 1 pill then skip 1 day then next day take the 2nd pill. You have 1 refill for future.  Try to regulate your pH to help prevent future yeast infections.  Probiotics and yogurt can help  Boric Acid Suppositories 600MG   #21 insert one vaginal at hs x 3 wks   Please schedule a Follow-up Appointment to: Return if symptoms worsen or fail to improve.  If you have any other questions or concerns, please feel free to call the office or send a message through MyChart. You may also schedule an earlier appointment if necessary.  Additionally, you may be receiving a survey about your experience at our office within a few days to 1 week by e-mail or mail. We value your feedback.  , DO [facility], [facility]

## 2022-04-06 NOTE — Progress Notes (Signed)
Subjective:    Patient ID: Danielle Morgan, female    DOB: 30-Mar-1996, 26 y.o.   MRN: [id number]  Danielle Morgan is a 26 y.o. female presenting on 04/06/2022 for Vaginitis  PCP Nicki Reaper, FNP   Patient presents for a same day appointment.   HPI  Yeast Vaginitis Recent acute onset yeast infection about 1 week ago, [place] may have triggered symptoms.   Also she had UTI, finished antibiotic recently. Then developed vaginal discharge. Persistent discharge, initially thick. Now improved. Associated some itching.   Had 1 left over Fluconazole symptoms improved but not resolved  Denies dysuria frequency fever pelvic pain     12/13/2021    1:52 PM 05/30/2021   10:27 AM 01/16/2021   10:17 AM  Depression screen PHQ 2/9  Decreased Interest 0 0 0  Down, Depressed, Hopeless 0 0 1  PHQ - 2 Score 0 0 1  Altered sleeping 0 0 0  Tired, decreased energy 0 0 1  Change in appetite 0 0 0  Feeling bad or failure about yourself  0 0 0  Trouble concentrating 0 0 0  Moving slowly or fidgety/restless 0 0 0  Suicidal thoughts 0 0 0  PHQ-9 Score 0 0 2  Difficult doing work/chores Not difficult at all Not difficult at all Not difficult at all    Social History   Tobacco Use   Smoking status: Never   Smokeless tobacco: Never  Vaping Use   Vaping Use: Never used  Substance Use Topics   Alcohol use: Yes    Comment: occasionally   Drug use: No    Review of Systems Per HPI unless specifically indicated above     Objective:    BP (!) 120/56   Pulse 77   Ht 5\' 5"  (1.651 m)   Wt 201 lb 9.6 oz (91.4 kg)   SpO2 100%   BMI 33.55 kg/m   Wt Readings from Last 3 Encounters:  04/06/22 201 lb 9.6 oz (91.4 kg)  12/13/21 189 lb (85.7 kg)  08/31/21 196 lb 12.8 oz (89.3 kg)    Physical Exam Vitals and nursing note reviewed.  Constitutional:      General: She is not in acute distress.    Appearance: Normal appearance. She is well-developed. She is not  diaphoretic.     Comments: Well-appearing, comfortable, cooperative  HENT:     Head: Normocephalic and atraumatic.  Eyes:     General:        Right eye: No discharge.        Left eye: No discharge.     Conjunctiva/sclera: Conjunctivae normal.  Cardiovascular:     Rate and Rhythm: Normal rate.  Pulmonary:     Effort: Pulmonary effort is normal.  Skin:    General: Skin is warm and dry.     Findings: No erythema or rash.  Neurological:     Mental Status: She is alert and oriented to person, place, and time.  Psychiatric:        Mood and Affect: Mood normal.        Behavior: Behavior normal.        Thought Content: Thought content normal.     Comments: Well groomed, good eye contact, normal speech and thoughts       Results for orders placed or performed in visit on 12/13/21  Urine Culture   Specimen: Urine  Result Value Ref Range   MICRO NUMBER: 12/15/21  SPECIMEN QUALITY: Adequate    Sample Source URINE    STATUS: FINAL    ISOLATE 1:      Less than 10,000 CFU/mL of single Gram negative organism isolated. No further testing will be performed. If clinically indicated, recollection using a method to minimize contamination, with prompt transfer to Urine Culture Transport Tube, is recommended.   COMMENT:      Additional non-predominating organism(s) isolated. These organisms, commonly found on external and internal genitalia, are considered colonizers. No further testing performed.  POCT urinalysis dipstick  Result Value Ref Range   Color, UA     Clarity, UA     Glucose, UA Negative Negative   Bilirubin, UA Negative    Ketones, UA Negative    Spec Grav, UA 1.020 1.010 - 1.025   Blood, UA Large    pH, UA 6.0 5.0 - 8.0   Protein, UA Positive (A) Negative   Urobilinogen, UA 0.2 0.2 or 1.0 E.U./dL   Nitrite, UA Negative    Leukocytes, UA Large (3+) (A) Negative   Appearance     Odor        Assessment & Plan:   Problem List Items Addressed This Visit   None Visit  Diagnoses     Yeast vaginitis    -  Primary   Relevant Medications   fluconazole (DIFLUCAN) 150 MG tablet       History suggestive of yeast vaginitis Recent antibiotic Failed OTC topical Similar to previous yeast infections  Plan: Offer self collect vaginal swab, she declines today, reconsider if symptoms unresolved  Start Diflucan 150mg  PO x 1 tab, repeat dose on Day 3 if persistent symptoms, add refill Handout AVS with info for reducing/preventing   Meds ordered this encounter  Medications   fluconazole (DIFLUCAN) 150 MG tablet    Sig: Take one tablet by mouth on Day 1. Repeat dose 2nd tablet on Day 3.    Dispense:  2 tablet    Refill:  1      Follow up plan: Return if symptoms worsen or fail to improve.   , DO [facility] 04/06/2022, 4:40 PM

## 2022-04-12 ENCOUNTER — Encounter: Payer: Self-pay | Admitting: Internal Medicine

## 2022-04-12 ENCOUNTER — Ambulatory Visit (INDEPENDENT_AMBULATORY_CARE_PROVIDER_SITE_OTHER): Payer: BC Managed Care – PPO | Admitting: Internal Medicine

## 2022-04-12 VITALS — BP 122/78 | HR 64 | Temp 97.7°F | Wt 204.0 lb

## 2022-04-12 DIAGNOSIS — E6609 Other obesity due to excess calories: Secondary | ICD-10-CM

## 2022-04-12 DIAGNOSIS — M5442 Lumbago with sciatica, left side: Secondary | ICD-10-CM | POA: Diagnosis not present

## 2022-04-12 DIAGNOSIS — Z6833 Body mass index (BMI) 33.0-33.9, adult: Secondary | ICD-10-CM

## 2022-04-12 DIAGNOSIS — E039 Hypothyroidism, unspecified: Secondary | ICD-10-CM | POA: Diagnosis not present

## 2022-04-12 DIAGNOSIS — E7800 Pure hypercholesterolemia, unspecified: Secondary | ICD-10-CM | POA: Diagnosis not present

## 2022-04-12 DIAGNOSIS — G43C0 Periodic headache syndromes in child or adult, not intractable: Secondary | ICD-10-CM | POA: Diagnosis not present

## 2022-04-12 DIAGNOSIS — G8929 Other chronic pain: Secondary | ICD-10-CM | POA: Insufficient documentation

## 2022-04-12 MED ORDER — PHENTERMINE HCL 37.5 MG PO CAPS: 37.5000 mg | ORAL_CAPSULE | ORAL | 2 refills | Status: DC

## 2022-04-12 NOTE — Patient Instructions (Signed)

## 2022-04-12 NOTE — Assessment & Plan Note (Signed)
Encourage diet and exercise for weight loss Phentermine refilled today

## 2022-04-12 NOTE — Assessment & Plan Note (Signed)
We will refer to chiropractic care If symptoms persist or worsen, would recommend MRI lumbar spine

## 2022-04-12 NOTE — Assessment & Plan Note (Signed)
C-Met and lipid profile today Encouraged her to consume a low fat diet and exercise for weight loss

## 2022-04-12 NOTE — Assessment & Plan Note (Signed)
Continue cyclobenzaprine and Tylenol as needed We will monitor

## 2022-04-12 NOTE — Assessment & Plan Note (Signed)
TSH and free T4 today We will adjust levothyroxine if needed based on labs 

## 2022-04-12 NOTE — Progress Notes (Signed)
Subjective:    Patient ID: Danielle Morgan, female    DOB: 05/04/1996, 26 y.o.   MRN: [id number]  HPI  Patient presents to clinic today for follow-up of chronic conditions.  Hypothyroidism: She denies any issues on her current dose of Levothyroxine.  She does not follow with endocrinology.  Migraines: These occur 1 x month.  Triggered by her menses.  She takes Cyclobenzaprine and Tylenol as needed with good relief of symptoms.  She does not follow with neurology.  HLD: Her last LDL was 123, triglycerides 58, 08/2021.  She is not taking any oral cholesterol-lowering medication at this time.  She does not consume a low-fat diet.  She is also due to follow-up on her weight.  She is no longer taking Phentermine as prescribed, she ran out in May.  Her weight today is 204 LBS with a BMI of 33.95.  She also reports intermittent left side low back pain. She noticed this 3 months but reports it has gotten worsen in the last 3 months. She describes the pain as dull. The pain radiates into her left calf. She reports associated numbness and tingling in her left foot. The pain is worse with standing, sometimes sitting or laying down. Xray of the lumbar spine from 11/2020 reviewed. She has not take any medication for this.  Review of Systems     Past Medical History:  Diagnosis Date   Frequent headaches    Thyroid disease     Current Outpatient Medications  Medication Sig Dispense Refill   cyclobenzaprine (FLEXERIL) 10 MG tablet Take 1 tablet (10 mg total) by mouth every 8 (eight) hours as needed for muscle spasms. Spasms. 30 tablet 0   fluconazole (DIFLUCAN) 150 MG tablet Take one tablet by mouth on Day 1. Repeat dose 2nd tablet on Day 3. 2 tablet 1   levothyroxine (SYNTHROID) 75 MCG tablet TAKE 1 TABLET BY MOUTH EVERY DAY 90 tablet 0   phentermine 37.5 MG capsule Take 1 capsule (37.5 mg total) by mouth every morning. 30 capsule 2   No current facility-administered medications for this visit.     No Known Allergies  Family History  Problem Relation Age of Onset   Arthritis Mother    Bipolar disorder Father    Cancer Paternal Grandmother        Lung   ADD / ADHD Sister    ADD / ADHD Brother    Heart disease Maternal Uncle    Arthritis Maternal Grandfather    Hypertension Maternal Grandfather    Alcohol abuse Maternal Grandfather     Social History   Socioeconomic History   Marital status: Married    Spouse name: Not on file   Number of children: Not on file   Years of education: Not on file   Highest education level: Not on file  Occupational History   Not on file  Tobacco Use   Smoking status: Never   Smokeless tobacco: Never  Vaping Use   Vaping Use: Never used  Substance and Sexual Activity   Alcohol use: Yes    Comment: occasionally   Drug use: No   Sexual activity: Yes    Partners: Male    Birth control/protection: I.U.D.  Other Topics Concern   Not on file  Social History Narrative   Not on file   Social Determinants of Health   Financial Resource Strain: Not on file  Food Insecurity: Not on file  Transportation Needs: Not on file  Physical Activity: Not  on file  Stress: Not on file  Social Connections: Not on file  Intimate Partner Violence: Not on file     Constitutional: Patient reports intermittent headaches, abnormal weight gain.  Denies fever, malaise, fatigue.  HEENT: Denies eye pain, eye redness, ear pain, ringing in the ears, wax buildup, runny nose, nasal congestion, bloody nose, or sore throat. Respiratory: Denies difficulty breathing, shortness of breath, cough or sputum production.   Cardiovascular: Denies chest pain, chest tightness, palpitations or swelling in the hands or feet.  Gastrointestinal: Denies abdominal pain, bloating, constipation, diarrhea or blood in the stool.  GU: Denies urgency, frequency, pain with urination, burning sensation, blood in urine, odor or discharge. Musculoskeletal: Pt reports left side low  back pain. Denies decrease in range of motion, difficulty with gait, or joint swelling.  Skin: Denies redness, rashes, lesions or ulcercations.  Neurological: Patient reports intermittent numbness and tingling in left foot.  Denies dizziness, difficulty with memory, difficulty with speech or problems with balance and coordination.  Psych: Denies anxiety, depression, SI/HI.  No other specific complaints in a complete review of systems (except as listed in HPI above).  Objective:   Physical Exam   BP 122/78 (BP Location: Right Arm, Patient Position: Sitting, Cuff Size: Normal)   Pulse 64   Temp 97.7 F (36.5 C) (Temporal)   Wt 204 lb (92.5 kg)   SpO2 99%   BMI 33.95 kg/m   Wt Readings from Last 3 Encounters:  04/06/22 201 lb 9.6 oz (91.4 kg)  12/13/21 189 lb (85.7 kg)  08/31/21 196 lb 12.8 oz (89.3 kg)    General: Appears her stated age, obese, in NAD. Skin: Warm, dry and intact.  HEENT: Head: normal shape and size; Eyes: sclera white, no icterus, conjunctiva pink, PERRLA and EOMs intact;  Cardiovascular: Normal rate and rhythm. S1,S2 noted.  No murmur, rubs or gallops noted. Pulmonary/Chest: Normal effort and positive vesicular breath sounds. No respiratory distress. No wheezes, rales or ronchi noted.  Musculoskeletal: Normal flexion, extension, rotation and lateral bending of the spine.  Pain with palpation of the lumbar spine.  Strength 5/5 BLE.  No difficulty with gait.  Neurological: Alert and oriented.  Sensation intact to soft and sharp touch, LLE.   BMET    Component Value Date/Time   NA 136 08/31/2021 1336   NA 137 10/23/2018 1044   K 3.7 08/31/2021 1336   CL 104 08/31/2021 1336   CO2 23 08/31/2021 1336   GLUCOSE 80 08/31/2021 1336   BUN 9 08/31/2021 1336   BUN 10 10/23/2018 1044   CREATININE 0.66 08/31/2021 1336   CALCIUM 9.2 08/31/2021 1336   GFRNONAA >60 01/13/2020 1826   GFRAA >60 01/13/2020 1826    Lipid Panel     Component Value Date/Time   CHOL 186  08/31/2021 1336   CHOL 167 10/23/2018 1044   TRIG 58 08/31/2021 1336   HDL 49 (L) 08/31/2021 1336   HDL 52 10/23/2018 1044   CHOLHDL 3.8 08/31/2021 1336   VLDL 34.0 12/30/2019 1510   LDLCALC 123 (H) 08/31/2021 1336    CBC    Component Value Date/Time   WBC 8.7 08/31/2021 1336   RBC 4.39 08/31/2021 1336   HGB 13.1 08/31/2021 1336   HGB 11.3 10/28/2019 0823   HCT 39.4 08/31/2021 1336   HCT 34.6 10/28/2019 0823   PLT 262 08/31/2021 1336   PLT 242 10/28/2019 0823   MCV 89.7 08/31/2021 1336   MCV 95 10/28/2019 0823   MCH 29.8  08/31/2021 1336   MCHC 33.2 08/31/2021 1336   RDW 12.5 08/31/2021 1336   RDW 11.6 (L) 10/28/2019 0823   LYMPHSABS 1.8 07/07/2019 1535   MONOABS 0.9 04/26/2015 1053   EOSABS 0.1 07/07/2019 1535   BASOSABS 0.0 07/07/2019 1535    Hgb A1C Lab Results  Component Value Date   HGBA1C 5.4 08/31/2021           Assessment & Plan:    RTC in 6 months, for your annual exam Nicki Reaper, NP

## 2022-04-13 LAB — COMPLETE METABOLIC PANEL WITH GFR
AG Ratio: 1.4 (calc) (ref 1.0–2.5)
ALT: 13 U/L (ref 6–29)
AST: 18 U/L (ref 10–30)
Albumin: 4.3 g/dL (ref 3.6–5.1)
Alkaline phosphatase (APISO): 67 U/L (ref 31–125)
BUN: 11 mg/dL (ref 7–25)
CO2: 28 mmol/L (ref 20–32)
Calcium: 9.3 mg/dL (ref 8.6–10.2)
Chloride: 105 mmol/L (ref 98–110)
Creat: 0.64 mg/dL (ref 0.50–0.96)
Globulin: 3 g/dL (calc) (ref 1.9–3.7)
Glucose, Bld: 66 mg/dL (ref 65–139)
Potassium: 4.3 mmol/L (ref 3.5–5.3)
Sodium: 139 mmol/L (ref 135–146)
Total Bilirubin: 0.3 mg/dL (ref 0.2–1.2)
Total Protein: 7.3 g/dL (ref 6.1–8.1)
eGFR: 125 mL/min/{1.73_m2} (ref >=60)

## 2022-04-13 LAB — T4, FREE: Free T4: 1.1 ng/dL (ref 0.8–1.8)

## 2022-04-13 LAB — LIPID PANEL
Cholesterol: 191 mg/dL (ref <200)
HDL: 51 mg/dL (ref >=50)
LDL Cholesterol (Calc): 122 mg/dL (calc) — ABNORMAL HIGH
Non-HDL Cholesterol (Calc): 140 mg/dL (calc) — ABNORMAL HIGH (ref <130)
Total CHOL/HDL Ratio: 3.7 (calc) (ref <5.0)
Triglycerides: 82 mg/dL (ref <150)

## 2022-04-13 LAB — TSH: TSH: 6.82 mIU/L — ABNORMAL HIGH

## 2022-04-13 NOTE — Addendum Note (Signed)
Addended by: Lorre Munroe on: 04/13/2022 08:23 AM   Modules accepted: Orders

## 2022-04-14 ENCOUNTER — Encounter: Payer: Self-pay | Admitting: Internal Medicine

## 2022-04-15 DIAGNOSIS — F411 Generalized anxiety disorder: Secondary | ICD-10-CM | POA: Diagnosis not present

## 2022-04-16 ENCOUNTER — Other Ambulatory Visit: Payer: Self-pay

## 2022-04-16 DIAGNOSIS — E039 Hypothyroidism, unspecified: Secondary | ICD-10-CM

## 2022-04-16 MED ORDER — LEVOTHYROXINE SODIUM 88 MCG PO TABS: 88.0000 ug | ORAL_TABLET | Freq: Every day | ORAL | 0 refills | Status: DC

## 2022-04-17 DIAGNOSIS — M5432 Sciatica, left side: Secondary | ICD-10-CM | POA: Diagnosis not present

## 2022-04-17 DIAGNOSIS — M545 Low back pain, unspecified: Secondary | ICD-10-CM | POA: Diagnosis not present

## 2022-04-17 DIAGNOSIS — M5416 Radiculopathy, lumbar region: Secondary | ICD-10-CM | POA: Diagnosis not present

## 2022-04-17 DIAGNOSIS — F411 Generalized anxiety disorder: Secondary | ICD-10-CM | POA: Diagnosis not present

## 2022-04-17 DIAGNOSIS — M9903 Segmental and somatic dysfunction of lumbar region: Secondary | ICD-10-CM | POA: Diagnosis not present

## 2022-04-18 DIAGNOSIS — M545 Low back pain, unspecified: Secondary | ICD-10-CM | POA: Diagnosis not present

## 2022-04-18 DIAGNOSIS — M5432 Sciatica, left side: Secondary | ICD-10-CM | POA: Diagnosis not present

## 2022-04-18 DIAGNOSIS — M5416 Radiculopathy, lumbar region: Secondary | ICD-10-CM | POA: Diagnosis not present

## 2022-04-18 DIAGNOSIS — M9903 Segmental and somatic dysfunction of lumbar region: Secondary | ICD-10-CM | POA: Diagnosis not present

## 2022-04-19 DIAGNOSIS — M9903 Segmental and somatic dysfunction of lumbar region: Secondary | ICD-10-CM | POA: Diagnosis not present

## 2022-04-19 DIAGNOSIS — M5416 Radiculopathy, lumbar region: Secondary | ICD-10-CM | POA: Diagnosis not present

## 2022-04-19 DIAGNOSIS — M5432 Sciatica, left side: Secondary | ICD-10-CM | POA: Diagnosis not present

## 2022-04-19 DIAGNOSIS — M545 Low back pain, unspecified: Secondary | ICD-10-CM | POA: Diagnosis not present

## 2022-04-22 DIAGNOSIS — F411 Generalized anxiety disorder: Secondary | ICD-10-CM | POA: Diagnosis not present

## 2022-05-09 ENCOUNTER — Encounter: Payer: Self-pay | Admitting: Internal Medicine

## 2022-05-09 DIAGNOSIS — G8929 Other chronic pain: Secondary | ICD-10-CM

## 2022-05-11 NOTE — Telephone Encounter (Deleted)
Copied from CRM [id number]. Topic: General - Other >> May 10, 2022  4:21 PM Ja-Kwan M wrote: Reason for CRM: Enrique Sack with [facility] reports that a PA is needed for MRI. Cb# [phone] Ext [phone] >> May 11, 2022 12:58 PM Marlow Baars wrote: Alvis Lemmings with the pre service center has called back stating they still haven't received a prior auth from [facility] for the patient to get her MRI done on Sunday 8/27. Please assist further

## 2022-05-13 ENCOUNTER — Ambulatory Visit: Admission: RE | Admit: 2022-05-13 | Payer: BC Managed Care – PPO | Source: Ambulatory Visit

## 2022-05-14 ENCOUNTER — Other Ambulatory Visit: Payer: BC Managed Care – PPO

## 2022-05-15 ENCOUNTER — Other Ambulatory Visit: Payer: BC Managed Care – PPO

## 2022-05-15 ENCOUNTER — Other Ambulatory Visit: Payer: Self-pay

## 2022-05-15 DIAGNOSIS — E039 Hypothyroidism, unspecified: Secondary | ICD-10-CM

## 2022-05-16 LAB — TSH: TSH: 3.14 mIU/L

## 2022-05-27 ENCOUNTER — Other Ambulatory Visit: Payer: Self-pay | Admitting: Internal Medicine

## 2022-05-28 MED ORDER — CYCLOBENZAPRINE HCL 10 MG PO TABS
10.0000 mg | ORAL_TABLET | Freq: Three times a day (TID) | ORAL | 0 refills | Status: DC | PRN
Start: 2022-05-28 — End: 2022-06-29

## 2022-06-03 ENCOUNTER — Encounter: Payer: Self-pay | Admitting: Internal Medicine

## 2022-06-03 DIAGNOSIS — G8929 Other chronic pain: Secondary | ICD-10-CM

## 2022-06-06 MED ORDER — GABAPENTIN 100 MG PO CAPS: 100.0000 mg | ORAL_CAPSULE | Freq: Three times a day (TID) | ORAL | 0 refills | Status: DC

## 2022-06-06 MED ORDER — PREDNISONE 10 MG PO TABS: ORAL_TABLET | ORAL | 0 refills | Status: DC

## 2022-06-06 NOTE — Addendum Note (Signed)
Addended by: Jearld Fenton on: 06/06/2022 06:15 PM   Modules accepted: Orders

## 2022-06-14 MED ORDER — GABAPENTIN 100 MG PO CAPS
200.0000 mg | ORAL_CAPSULE | Freq: Three times a day (TID) | ORAL | 0 refills | Status: DC
Start: 2022-06-14 — End: 2022-06-28

## 2022-06-14 MED ORDER — PREDNISONE 10 MG PO TABS: ORAL_TABLET | ORAL | 0 refills | Status: DC

## 2022-06-14 NOTE — Addendum Note (Signed)
Addended by: Jearld Fenton on: 06/14/2022 10:31 AM   Modules accepted: Orders

## 2022-06-26 ENCOUNTER — Telehealth: Payer: Self-pay | Admitting: Orthopedic Surgery

## 2022-06-26 DIAGNOSIS — M545 Low back pain, unspecified: Secondary | ICD-10-CM

## 2022-06-26 NOTE — Progress Notes (Signed)
Referring Physician:  Lorre Munroe, NP [address] [place],  [place] [address]  Primary Physician:  Lorre Munroe, NP  History of Present Illness: 06/26/2022 [name] is here today with a chief complaint of constant left sided LBP with pain down to left calf and numbness in left foot x 6 months. She has trouble sleeping at night due to pain. No specific alleviating factors. No weakness in her legs. No right sided LBP or leg pain.   PCP has given her 2 rounds of prednisone- this only helped short term. She is on neurontin. She has flexeril to use prn. No relief with chiropractor.   No bowel or bladder issues. She thinks she may have uterine prolapse. Is seeing OB/GYN for this.   Conservative measures:  Physical therapy: none, has seen chiropractor.   Multimodal medical therapy including regular antiinflammatories: prednisone, flexeril, neurontin Injections: No epidural steroid injections  Past Surgery: no spinal surgery.   Danielle Morgan has no symptoms of cervical myelopathy.  The symptoms are causing a significant impact on the patient's life.   Review of Systems:  A 10 point review of systems is negative, except for the pertinent positives and negatives detailed in the HPI.  Past Medical History: Past Medical History:  Diagnosis Date   Frequent headaches    Thyroid disease     Past Surgical History: Past Surgical History:  Procedure Laterality Date   NO PAST SURGERIES      Allergies: Allergies as of 06/28/2022   (No Known Allergies)    Medications: Outpatient Encounter Medications as of 06/28/2022  Medication Sig   cyclobenzaprine (FLEXERIL) 10 MG tablet Take 1 tablet (10 mg total) by mouth every 8 (eight) hours as needed for muscle spasms. Spasms.   gabapentin (NEURONTIN) 100 MG capsule Take 2 capsules (200 mg total) by mouth 3 (three) times daily.   levothyroxine (SYNTHROID) 88 MCG tablet Take 1 tablet (88 mcg total) by mouth daily.   phentermine  37.5 MG capsule Take 1 capsule (37.5 mg total) by mouth every morning.   predniSONE (DELTASONE) 10 MG tablet Take 3 tabs on days 1-3, 2 tabs on days 4-6, 1 tab on days 7-9   No facility-administered encounter medications on file as of 06/28/2022.    Social History: Social History   Tobacco Use   Smoking status: Never   Smokeless tobacco: Never  Vaping Use   Vaping Use: Never used  Substance Use Topics   Alcohol use: Yes    Comment: occasionally   Drug use: No    Family Medical History: Family History  Problem Relation Age of Onset   Arthritis Mother    Bipolar disorder Father    Cancer Paternal Grandmother        Lung   ADD / ADHD Sister    ADD / ADHD Brother    Heart disease Maternal Uncle    Arthritis Maternal Grandfather    Hypertension Maternal Grandfather    Alcohol abuse Maternal Grandfather     Physical Examination: There were no vitals filed for this visit.  General: Patient is well developed, well nourished, calm, collected, and in no apparent distress. Attention to examination is appropriate.  Respiratory: Patient is breathing without any difficulty.   NEUROLOGICAL:     Awake, alert, oriented to person, place, and time.  Speech is clear and fluent. Fund of knowledge is appropriate.   Cranial Nerves: Pupils equal round and reactive to light.  Facial tone is symmetric.  Facial sensation is symmetric.  ROM of spine:  Reasonable ROM of lumbar spine without pain  No abnormal lesions on exposed skin.   Strength: Side Biceps Triceps Deltoid Interossei Grip Wrist Ext. Wrist Flex.  R 5 5 5 5 5 5 5   L 5 5 5 5 5 5 5    Side Iliopsoas Quads Hamstring PF DF EHL  R 5 5 5 5 5 5   L 5 5 5 5 5 5    Reflexes are 2+ and symmetric at the biceps, triceps, brachioradialis, patella and achilles.   Hoffman's is absent.  Clonus is not present.   Bilateral upper and lower extremity sensation is intact to light touch.     Mild left sided lower lumbar tenderness into her  left buttock.   Gait is normal.      Medical Decision Making  Imaging: Lumbar xrays 06/27/22: Mild DDD L5-S1.   Xray report not available for review.   Assessment and Plan: [name] is a pleasant 26 y.o. female with 6 month history of constant left sided LBP with pain down to left calf and numbness in left foot that is getting worse. No right sided LBP or leg pain.   Lumbar xrays show mild DDD L5-S1.   Treatment options discussed with patient and following plan made:   - MRI of lumbar spine ordered to evaluate left lumbar radiculopathy. No relief with medications or time.  - Increase neurontin to 300mg  tid. Reviewed proper dosing and side effects. She had no side effects with 200mg  tid.  - Okay to continue prn flexeril.  - Order for physical therapy for lumbar spine at [facility]. - Depending on MRI, may consider injections.  - Will set up phone visit to review MRI results.   I spent a total of 30 minutes in face-to-face and non-face-to-face activities related to this patient's care today.  Thank you for involving me in the care of this patient.   Geronimo Boot PA-C Dept. of Neurosurgery

## 2022-06-26 NOTE — Telephone Encounter (Signed)
Please call and have her get lumbar xrays prior to seeing me on Wednesday. They are ordered.   Thanks!

## 2022-06-26 NOTE — Telephone Encounter (Signed)
Patient is aware to get xrays before appt.

## 2022-06-27 ENCOUNTER — Ambulatory Visit
Admission: RE | Admit: 2022-06-27 | Discharge: 2022-06-27 | Disposition: A | Discharge status: Home / Self Care | Payer: BC Managed Care – PPO | Attending: Orthopedic Surgery | Admitting: Orthopedic Surgery

## 2022-06-27 ENCOUNTER — Ambulatory Visit
Admission: RE | Admit: 2022-06-27 | Discharge: 2022-06-27 | Disposition: A | Discharge status: Home / Self Care | Payer: BC Managed Care – PPO | Source: Ambulatory Visit | Attending: Orthopedic Surgery | Admitting: Orthopedic Surgery

## 2022-06-27 ENCOUNTER — Other Ambulatory Visit: Payer: Self-pay | Admitting: Internal Medicine

## 2022-06-27 DIAGNOSIS — M545 Low back pain, unspecified: Secondary | ICD-10-CM

## 2022-06-27 DIAGNOSIS — E039 Hypothyroidism, unspecified: Secondary | ICD-10-CM

## 2022-06-28 ENCOUNTER — Encounter: Payer: Self-pay | Admitting: Orthopedic Surgery

## 2022-06-28 ENCOUNTER — Ambulatory Visit (INDEPENDENT_AMBULATORY_CARE_PROVIDER_SITE_OTHER): Payer: BC Managed Care – PPO | Admitting: Orthopedic Surgery

## 2022-06-28 VITALS — BP 118/78 | Ht 65.0 in | Wt 209.0 lb

## 2022-06-28 DIAGNOSIS — M5416 Radiculopathy, lumbar region: Secondary | ICD-10-CM | POA: Diagnosis not present

## 2022-06-28 DIAGNOSIS — M5136 Other intervertebral disc degeneration, lumbar region: Secondary | ICD-10-CM | POA: Diagnosis not present

## 2022-06-28 MED ORDER — GABAPENTIN 300 MG PO CAPS: 300.0000 mg | ORAL_CAPSULE | Freq: Three times a day (TID) | ORAL | 1 refills | Status: DC

## 2022-06-28 NOTE — Patient Instructions (Signed)
It was so nice to see you today, I am sorry that you are hurting so much.   Your lower back xrays showed some mild narrowing of the disc at L5-S1 and I think this may be causing your pain.   I sent physical therapy orders to [facility], they should call you to schedule.   I sent a prescription for gababpentin to help with your pain. Take as directed.    Okay to continue on flexeril as needed to help with muscle spasms. Use only as needed and be careful, this can make you sleepy.   I want to get an MRI of your lower back to look into things further. We will call you to set this up.   Once MRI is done, we will call you to schedule a phone call visit to review the results.   Please do not hesitate to call if you have any questions or concerns. You can also message me in [place].   If you have not heard back about any of the tests/procedures in the next week, please call the office so we can help you get these things scheduled.   Geronimo Boot PA-C [phone]

## 2022-06-28 NOTE — Telephone Encounter (Signed)
Requested Prescriptions  Pending Prescriptions Disp Refills  . levothyroxine (SYNTHROID) 88 MCG tablet [Pharmacy Med Name: LEVOTHYROXINE 88 MCG TABLET] 90 tablet 2    Sig: TAKE 1 TABLET BY MOUTH EVERY DAY     Endocrinology:  Hypothyroid Agents Passed - 06/27/2022  1:38 PM      Passed - TSH in normal range and within 360 days    TSH  Date Value Ref Range Status  05/15/2022 3.14 mIU/L Final    Comment:              Reference Range .           > or = 20 Years  0.40-4.50 .                Pregnancy Ranges           First trimester    0.26-2.66           Second trimester   0.55-2.73           Third trimester    0.43-2.91          Passed - Valid encounter within last 12 months    Recent Outpatient Visits          2 months ago Pure hypercholesterolemia   [place], Coralie Keens, NP   2 months ago Yeast vaginitis   [facility] [place], Devonne Doughty, DO   6 months ago [facility], Dani Gobble, PA-C   10 months ago Encounter for general adult medical examination with abnormal findings   [facility] [place], Coralie Keens, NP   1 year ago Class 2 obesity due to excess calories without serious comorbidity with body mass index (BMI) of 35.0 to 35.9 in adult   [place], [place], NP             . cyclobenzaprine (FLEXERIL) 10 MG tablet [Pharmacy Med Name: CYCLOBENZAPRINE 10 MG TABLET] 30 tablet 0    Sig: TAKE 1 TABLET (10 MG TOTAL) BY MOUTH EVERY 8 (EIGHT) HOURS AS NEEDED FOR MUSCLE SPASMS. SPASMS.     Not Delegated - Analgesics:  Muscle Relaxants Failed - 06/27/2022  1:38 PM      Failed - This refill cannot be delegated      Passed - Valid encounter within last 6 months    Recent Outpatient Visits          2 months ago Pure hypercholesterolemia   [place], Coralie Keens, NP   2 months ago Yeast vaginitis   [facility], Devonne Doughty, DO   6 months ago [facility], Dani Gobble, PA-C   10 months ago Encounter for general adult medical examination with abnormal findings   [facility] [place], Coralie Keens, NP   1 year ago Class 2 obesity due to excess calories without serious comorbidity with body mass index (BMI) of 35.0 to 35.9 in adult   [facility] [place], Coralie Keens, [place]

## 2022-06-28 NOTE — Telephone Encounter (Signed)
Requested medication (s) are due for refill today - yes  Requested medication (s) are on the active medication list -yes  Future visit scheduled -no  Last refill: 05/28/22 #30  Notes to clinic: non delegated Rx  Requested Prescriptions  Pending Prescriptions Disp Refills   cyclobenzaprine (FLEXERIL) 10 MG tablet [Pharmacy Med Name: CYCLOBENZAPRINE 10 MG TABLET] 30 tablet 0    Sig: TAKE 1 TABLET (10 MG TOTAL) BY MOUTH EVERY 8 (EIGHT) HOURS AS NEEDED FOR MUSCLE SPASMS. SPASMS.     Not Delegated - Analgesics:  Muscle Relaxants Failed - 06/27/2022  1:38 PM      Failed - This refill cannot be delegated      Passed - Valid encounter within last 6 months    Recent Outpatient Visits           2 months ago Pure hypercholesterolemia   [facility] [place], Salvadore [place], NP   2 months ago Yeast vaginitis   [facility] [place], Netta Neat, DO   6 months ago Dysuria   [facility], Oswaldo Conroy, PA-C   10 months ago Encounter for general adult medical examination with abnormal findings   [facility], Salvadore [place], NP   1 year ago Class 2 obesity due to excess calories without serious comorbidity with body mass index (BMI) of 35.0 to 35.9 in adult   [facility] [place], Salvadore [place], NP              Signed Prescriptions Disp Refills   levothyroxine (SYNTHROID) 88 MCG tablet 90 tablet 2    Sig: TAKE 1 TABLET BY MOUTH EVERY DAY     Endocrinology:  Hypothyroid Agents Passed - 06/27/2022  1:38 PM      Passed - TSH in normal range and within 360 days    TSH  Date Value Ref Range Status  05/15/2022 3.14 mIU/L Final    Comment:              Reference Range .           > or = 20 Years  0.40-4.50 .                Pregnancy Ranges           First trimester    0.26-2.66           Second trimester   0.55-2.73           Third trimester    0.43-2.91          Passed - Valid encounter within last 12 months     Recent Outpatient Visits           2 months ago Pure hypercholesterolemia   [facility] [place] [place], Salvadore [place], NP   2 months ago Yeast vaginitis   [facility] [place], Netta Neat, DO   6 months ago Dysuria   [facility]ecum, Oswaldo Conroy, PA-C   10 months ago Encounter for general adult medical examination with abnormal findings   [facility] [place], Salvadore [place], NP   1 year ago Class 2 obesity due to excess calories without serious comorbidity with body mass index (BMI) of 35.0 to 35.9 in adult   [facility] [place], Salvadore [place], NP                 Requested Prescriptions  Pending Prescriptions Disp Refills  cyclobenzaprine (FLEXERIL) 10 MG tablet [Pharmacy Med Name: CYCLOBENZAPRINE 10 MG TABLET] 30 tablet 0    Sig: TAKE 1 TABLET (10 MG TOTAL) BY MOUTH EVERY 8 (EIGHT) HOURS AS NEEDED FOR MUSCLE SPASMS. SPASMS.     Not Delegated - Analgesics:  Muscle Relaxants Failed - 06/27/2022  1:38 PM      Failed - This refill cannot be delegated      Passed - Valid encounter within last 6 months    Recent Outpatient Visits           2 months ago Pure hypercholesterolemia   [place], Coralie Keens, NP   2 months ago Yeast vaginitis   [facility] [place], Devonne Doughty, DO   6 months ago [facility] Mec[facility], Dani Gobble, PA-C   10 months ago Encounter for general adult medical examination with abnormal findings   [facility] [place], Coralie Keens, NP   1 year ago Class 2 obesity due to excess calories without serious comorbidity with body mass index (BMI) of 35.0 to 35.9 in adult   [place], Coralie Keens, NP              Signed Prescriptions Disp Refills   levothyroxine (SYNTHROID) 88 MCG tablet 90 tablet 2    Sig: TAKE 1 TABLET BY MOUTH EVERY DAY     Endocrinology:  Hypothyroid Agents Passed - 06/27/2022  1:38 PM       Passed - TSH in normal range and within 360 days    TSH  Date Value Ref Range Status  05/15/2022 3.14 mIU/L Final    Comment:              Reference Range .           > or = 20 Years  0.40-4.50 .                Pregnancy Ranges           First trimester    0.26-2.66           Second trimester   0.55-2.73           Third trimester    0.43-2.91          Passed - Valid encounter within last 12 months    Recent Outpatient Visits           2 months ago Pure hypercholesterolemia   [place], Coralie Keens, NP   2 months ago Yeast vaginitis   [facility] [place], Devonne Doughty, DO   6 months ago [facility], Dani Gobble, PA-C   10 months ago Encounter for general adult medical examination with abnormal findings   [facility] [place], Coralie Keens, NP   1 year ago Class 2 obesity due to excess calories without serious comorbidity with body mass index (BMI) of 35.0 to 35.9 in adult   [facility], Coralie Keens, [place]

## 2022-07-06 ENCOUNTER — Ambulatory Visit
Admission: RE | Admit: 2022-07-06 | Discharge: 2022-07-06 | Disposition: A | Discharge status: Home / Self Care | Payer: BC Managed Care – PPO | Source: Ambulatory Visit | Attending: Orthopedic Surgery | Admitting: Orthopedic Surgery

## 2022-07-06 DIAGNOSIS — M5416 Radiculopathy, lumbar region: Secondary | ICD-10-CM | POA: Insufficient documentation

## 2022-07-06 DIAGNOSIS — M5126 Other intervertebral disc displacement, lumbar region: Secondary | ICD-10-CM | POA: Diagnosis not present

## 2022-07-06 DIAGNOSIS — M5136 Other intervertebral disc degeneration, lumbar region: Secondary | ICD-10-CM | POA: Insufficient documentation

## 2022-07-09 DIAGNOSIS — M5416 Radiculopathy, lumbar region: Secondary | ICD-10-CM

## 2022-07-09 DIAGNOSIS — M5136 Other intervertebral disc degeneration, lumbar region: Secondary | ICD-10-CM

## 2022-07-09 MED ORDER — METHOCARBAMOL 500 MG PO TABS: 500.0000 mg | ORAL_TABLET | Freq: Three times a day (TID) | ORAL | 0 refills | Status: DC | PRN

## 2022-07-09 NOTE — Telephone Encounter (Signed)
Can add her for phone visit on Friday at 3pm if she can do it.

## 2022-07-09 NOTE — Addendum Note (Signed)
Addended byGeronimo Boot on: 07/09/2022 03:43 PM   Modules accepted: Orders

## 2022-07-09 NOTE — Telephone Encounter (Signed)
PT orders done for [facility]. May want to tell her other options to get in sooner if they are over booked.

## 2022-07-09 NOTE — Telephone Encounter (Signed)
She is scheduled for 11/2, that was the next opening. I added her to the cancellation list.

## 2022-07-09 NOTE — Addendum Note (Signed)
Addended byGeronimo Boot on: 07/09/2022 01:45 PM   Modules accepted: Orders

## 2022-07-12 NOTE — Progress Notes (Signed)
Telephone Visit- Progress Note: Referring Physician:  Jearld Fenton, NP [address] [place],  [place] [address]  Primary Physician:  Jearld Fenton, NP  This visit was performed via telephone.  Patient location: home Provider location: office  I spent a total of 20 minutes non-face-to-face activities for this visit on the date of this encounter including review of current clinical condition and response to treatment.    Chief Complaint:  f/u lumbar MRI   History of Present Illness: Danielle Morgan is a 26 y.o. female continues with constant left sided LBP with pain down to left calf and numbness in left foot. No weakness in her legs. No right sided LBP or leg pain.   She was to increase neurontin to 300mg  tid after her last visit. She called and was told to stop flexeril and was given robaxin. PT was ordered, she has not heard from them yet.   She continues with constant left sided LBP with left leg pain to her calf with numbness in her foot. Left leg pain is her primary complaint. No bowel or bladder issues.   Some relief with neurontin and robaxin. No side effects.    Exam: No exam done as this was a telephone encounter.     Imaging: Lumbar MRI dated 07/06/22:  FINDINGS: Segmentation: Standard. Lowest well-formed disc space labeled the L5-S1 level.   Alignment: Physiologic with preservation of the normal lumbar lordosis. No listhesis.   Vertebrae: Vertebral body height maintained without acute or chronic fracture. Bone marrow signal intensity within normal limits. No discrete or worrisome osseous lesions or abnormal marrow edema.   Conus medullaris and cauda equina: Conus extends to the T12-L1 level. Conus and cauda equina appear normal.   Paraspinal and other soft tissues: Unremarkable.   Disc levels:   L1-2:  Unremarkable.   L2-3: Disc bulge with disc desiccation. Lobulated central to right paracentral disc protrusion indents the ventral thecal sac.  No significant spinal stenosis. Foramina remain patent.   L3-4: Normal interspace. Mild facet hypertrophy. No canal or foraminal stenosis.   L4-5: Disc desiccation. Small central disc protrusion mildly indents the ventral thecal sac. Mild bilateral facet hypertrophy. Resultant mild bilateral lateral recess stenosis. Central canal remains patent. No significant foraminal narrowing.   L5-S1: Degenerate intervertebral disc space narrowing with disc desiccation and diffuse disc bulge. Mild endplate spurring. Large central to left subarticular disc protrusion indents the ventral thecal sac (series 8, image 27). Resultant severe canal with left worse than right lateral recess stenosis. Foramina remain patent.   IMPRESSION: 1. Large central to left subarticular disc protrusion at L5-S1 with resultant severe canal and left worse than right lateral recess stenosis. 2. Small central disc protrusion at L4-5 with resultant mild bilateral lateral recess stenosis. 3. Central to right paracentral disc protrusion at L2-3 without significant stenosis or impingement.     Electronically Signed   By: Jeannine Boga M.D.   On: 07/06/2022 21:56    I have personally reviewed the images and agree with the above interpretation.  Assessment and Plan: Danielle Morgan is a pleasant 26 y.o. female with constant left sided LBP with left leg pain to her calf with numbness in her foot. Left leg pain is her primary complaint.  Known mild DDD L5-S1 with large central to left subarticular disc protrusion at L5-S1 with resultant severe canal and left worse than right lateral recess stenosis.  Treatment options discussed with patient and following plan made:   - [facility]  to [facility].  - Increase neurontin to 300mg  bid and 600mg  at night. Reviewed dosing and side effects.  - Continue on prn robaxin.  - Referral to PMR for fast track lumbar ESI (bilateral L5-S1 TF).  - Follow up with  me in 6 weeks.  - If no improvement with above, may need to consider surgery options.   PA-C Neurosurgery

## 2022-07-13 ENCOUNTER — Encounter: Payer: Self-pay | Admitting: Orthopedic Surgery

## 2022-07-13 ENCOUNTER — Ambulatory Visit (INDEPENDENT_AMBULATORY_CARE_PROVIDER_SITE_OTHER): Payer: BC Managed Care – PPO | Admitting: Orthopedic Surgery

## 2022-07-13 DIAGNOSIS — M5136 Other intervertebral disc degeneration, lumbar region: Secondary | ICD-10-CM | POA: Diagnosis not present

## 2022-07-13 DIAGNOSIS — M5416 Radiculopathy, lumbar region: Secondary | ICD-10-CM | POA: Diagnosis not present

## 2022-07-13 DIAGNOSIS — M5126 Other intervertebral disc displacement, lumbar region: Secondary | ICD-10-CM

## 2022-07-16 DIAGNOSIS — M5117 Intervertebral disc disorders with radiculopathy, lumbosacral region: Secondary | ICD-10-CM | POA: Diagnosis not present

## 2022-07-18 DIAGNOSIS — M5117 Intervertebral disc disorders with radiculopathy, lumbosacral region: Secondary | ICD-10-CM | POA: Diagnosis not present

## 2022-07-19 ENCOUNTER — Telehealth: Payer: BC Managed Care – PPO | Admitting: Orthopedic Surgery

## 2022-07-20 DIAGNOSIS — M5117 Intervertebral disc disorders with radiculopathy, lumbosacral region: Secondary | ICD-10-CM | POA: Diagnosis not present

## 2022-07-24 DIAGNOSIS — M5117 Intervertebral disc disorders with radiculopathy, lumbosacral region: Secondary | ICD-10-CM | POA: Diagnosis not present

## 2022-07-26 DIAGNOSIS — M5117 Intervertebral disc disorders with radiculopathy, lumbosacral region: Secondary | ICD-10-CM | POA: Diagnosis not present

## 2022-07-31 DIAGNOSIS — M5117 Intervertebral disc disorders with radiculopathy, lumbosacral region: Secondary | ICD-10-CM | POA: Diagnosis not present

## 2022-08-02 DIAGNOSIS — M5117 Intervertebral disc disorders with radiculopathy, lumbosacral region: Secondary | ICD-10-CM | POA: Diagnosis not present

## 2022-08-13 NOTE — Progress Notes (Unsigned)
Referring Physician:  Lorre Munroe, NP [address] [place],  [place] [address]  Primary Physician:  Lorre Munroe, NP  History of Present Illness: Danielle Morgan was last seen by me for telephone visit on 07/13/22. She has known mild DDD L5-S1 with large central to left subarticular disc protrusion at L5-S1 with resultant severe canal and left worse than right lateral recess stenosis.  She was sent to [facility] did initial evaluation on 07/16/22) and advised to increase her neurontin to 300mg  bid and 600mg  at night. She was referred to PMR at [facility] for lumbar injection- she has appointment with Dr. tomorrow.   She is here for follow up.   She feels like she is seeing some relief with PT, but she continues with constant LBP with left leg pain to her calf. Pain is worse with prolonged standing (she is [facility]). She has difficulty sleeping at night due to pain. Pain in leg is burning. Intermittent numbness posterior calf on left. No weakness in left leg. No bowel or bladder issues. Has uterine prolapse and is seeing OB/GYN for this.   She has seen some improvement with increasing her neurontin.    Conservative measures:  Physical therapy: in PT at [facility]- started 07/13/22 and has been to 7 visits Multimodal medical therapy including regular antiinflammatories: prednisone, flexeril, neurontin Injections: No epidural steroid injections  Past Surgery: no spinal surgery.    Review of Systems:  A 10 point review of systems is negative, except for the pertinent positives and negatives detailed in the HPI.  Past Medical History: Past Medical History:  Diagnosis Date   Frequent headaches    Thyroid disease     Past Surgical History: Past Surgical History:  Procedure Laterality Date   NO PAST SURGERIES      Allergies: Allergies as of 08/14/2022   (No Known Allergies)    Medications: Outpatient Encounter Medications as of 08/14/2022   Medication Sig   gabapentin (NEURONTIN) 300 MG capsule Take 1 capsule (300 mg total) by mouth 3 (three) times daily.   levothyroxine (SYNTHROID) 88 MCG tablet TAKE 1 TABLET BY MOUTH EVERY DAY   methocarbamol (ROBAXIN) 500 MG tablet Take 1 tablet (500 mg total) by mouth every 8 (eight) hours as needed for muscle spasms.   phentermine 37.5 MG capsule Take 1 capsule (37.5 mg total) by mouth every morning.   predniSONE (DELTASONE) 10 MG tablet Take 3 tabs on days 1-3, 2 tabs on days 4-6, 1 tab on days 7-9   No facility-administered encounter medications on file as of 08/14/2022.    Social History: Social History   Tobacco Use   Smoking status: Never   Smokeless tobacco: Never  Vaping Use   Vaping Use: Never used  Substance Use Topics   Alcohol use: Yes    Comment: occasionally   Drug use: No    Family Medical History: Family History  Problem Relation Age of Onset   Arthritis Mother    Bipolar disorder Father    Cancer Paternal Grandmother        Lung   ADD / ADHD Sister    ADD / ADHD Brother    Heart disease Maternal Uncle    Arthritis Maternal Grandfather    Hypertension Maternal Grandfather    Alcohol abuse Maternal Grandfather     Physical Examination: There were no vitals filed for this visit.    Awake, alert, oriented to person, place, and time.  Speech  is clear and fluent. Fund of knowledge is appropriate.   Cranial Nerves: Pupils equal round and reactive to light.  Facial tone is symmetric.   No abnormal lesions on exposed skin.   Strength: Side Iliopsoas Quads Hamstring PF DF EHL  R 5 5 5 5 5 5   L 5 5 5 5 5 5    Reflexes are 2+ and symmetric at the patella and achilles.    Bilateral lower extremity sensation is intact to light touch.     Gait is normal.      Medical Decision Making  Imaging: No updated lumbar imaging.   Assessment and Plan: Danielle Morgan is a pleasant 26 y.o. female with constant left sided LBP with left leg pain to her calf with  numbness in left posterior calf. Left leg pain is her primary complaint.   Known mild DDD L5-S1 with large central to left subarticular disc protrusion at L5-S1 with resultant severe canal and left worse than right lateral recess stenosis.   Treatment options discussed with patient and following plan made:   - I discussed treatment options with patient including surgery.  - She would like to finish out at least 6 weeks of PT prior to considering any surgery.  - She has consultation with Dr. Cedric Fishman tomorrow to discuss possible ESI. She is not sure she is going to do this.  - Continue PT at [facility].  - Continue on neurontin. Taking 300mg  bid and 600mg  at night.  - Follow up with Dr. Mariah Milling in 4-5 weeks for recheck. May need to discuss surgery options at that time.  - Will message her on Friday to check on her visit with PMR.    I spent a total of 20 minutes in face-to-face and non-face-to-face activities related to this patient's care today.  PA-C Dept. of Neurosurgery

## 2022-08-14 ENCOUNTER — Encounter: Payer: Self-pay | Admitting: Orthopedic Surgery

## 2022-08-14 ENCOUNTER — Ambulatory Visit (INDEPENDENT_AMBULATORY_CARE_PROVIDER_SITE_OTHER): Payer: BC Managed Care – PPO | Admitting: Orthopedic Surgery

## 2022-08-14 VITALS — BP 126/78 | Ht 65.0 in | Wt 209.0 lb

## 2022-08-14 DIAGNOSIS — M5126 Other intervertebral disc displacement, lumbar region: Secondary | ICD-10-CM

## 2022-08-14 DIAGNOSIS — M5136 Other intervertebral disc degeneration, lumbar region: Secondary | ICD-10-CM

## 2022-08-14 DIAGNOSIS — M5416 Radiculopathy, lumbar region: Secondary | ICD-10-CM | POA: Diagnosis not present

## 2022-08-15 DIAGNOSIS — M5442 Lumbago with sciatica, left side: Secondary | ICD-10-CM | POA: Diagnosis not present

## 2022-08-15 DIAGNOSIS — G8929 Other chronic pain: Secondary | ICD-10-CM | POA: Diagnosis not present

## 2022-08-15 DIAGNOSIS — M5117 Intervertebral disc disorders with radiculopathy, lumbosacral region: Secondary | ICD-10-CM | POA: Diagnosis not present

## 2022-08-15 DIAGNOSIS — M48062 Spinal stenosis, lumbar region with neurogenic claudication: Secondary | ICD-10-CM | POA: Diagnosis not present

## 2022-08-21 ENCOUNTER — Other Ambulatory Visit: Payer: Self-pay | Admitting: Orthopedic Surgery

## 2022-08-22 DIAGNOSIS — M5117 Intervertebral disc disorders with radiculopathy, lumbosacral region: Secondary | ICD-10-CM | POA: Diagnosis not present

## 2022-08-23 DIAGNOSIS — M5126 Other intervertebral disc displacement, lumbar region: Secondary | ICD-10-CM

## 2022-08-23 DIAGNOSIS — M5136 Other intervertebral disc degeneration, lumbar region: Secondary | ICD-10-CM

## 2022-08-23 DIAGNOSIS — M5416 Radiculopathy, lumbar region: Secondary | ICD-10-CM

## 2022-08-23 NOTE — Telephone Encounter (Signed)
Message sent to patient to verify her neurontin directions- is she still taking 300mg  in morning and at lunch then taking 600mg  at night?   I refilled neurontin by mistake taking 300mg  tid. Will have staff call to cancel.

## 2022-08-24 DIAGNOSIS — M48062 Spinal stenosis, lumbar region with neurogenic claudication: Secondary | ICD-10-CM | POA: Diagnosis not present

## 2022-08-24 DIAGNOSIS — M5442 Lumbago with sciatica, left side: Secondary | ICD-10-CM | POA: Diagnosis not present

## 2022-09-19 MED ORDER — METHOCARBAMOL 500 MG PO TABS: 500.0000 mg | ORAL_TABLET | Freq: Three times a day (TID) | ORAL | 0 refills | Status: DC | PRN

## 2022-09-19 MED ORDER — GABAPENTIN 300 MG PO CAPS: ORAL_CAPSULE | ORAL | 1 refills | Status: DC

## 2022-09-19 NOTE — Progress Notes (Signed)
Referring Physician:  Lorre Munroe, NP [address] [place],  [place] [address]  Primary Physician:  Lorre Munroe, NP  History of Present Illness: 09/20/2022 [name] is here today with a chief complaint of low back, left buttock, and left leg pain.  She has numbness in the last 3 toes on the left side.  She began having symptoms in April 2023.  Standing, laying on her side, and sitting makes her pain worse.  She has tried physical therapy for more than 6 weeks without improvement.  She had an injection which helped for 3 weeks but her pain returned approximately 3 days ago.   Bowel/Bladder Dysfunction: none  Conservative measures:  Physical therapy: PT at [facility] for 6 weeks - started on 07/16/22. Not discharged yet Multimodal medical therapy including regular antiinflammatories:  gabapentin, methocarbamol, prednisone Injections: has received epidural steroid injections 08/24/22: Left S1 TF ESI (Dr. Mariah Milling) - helped for about 3 weeks  Past Surgery:  denies  [name] has no symptoms of cervical myelopathy.  The symptoms are causing a significant impact on the patient's life.    Progress Note from Drake Leach, [place] on 08/14/22:   History of Present Illness: Danielle Morgan was last seen by me for telephone visit on 07/13/22. She has known mild DDD L5-S1 with large central to left subarticular disc protrusion at L5-S1 with resultant severe canal and left worse than right lateral recess stenosis.   She was sent to [facility] did initial evaluation on 07/16/22) and advised to increase her neurontin to 300mg  bid and 600mg  at night. She was referred to PMR at [facility] for lumbar injection- she has appointment with Dr. tomorrow.    She is here for follow up.    She feels like she is seeing some relief with PT, but she continues with constant LBP with left leg pain to her calf. Pain is worse with prolonged standing (she is [facility]). She has  difficulty sleeping at night due to pain. Pain in leg is burning. Intermittent numbness posterior calf on left. No weakness in left leg. No bowel or bladder issues. Has uterine prolapse and is seeing OB/GYN for this.    She has seen some improvement with increasing her neurontin.   I have utilized the care everywhere function in epic to review the outside records available from external health systems.  Review of Systems:  A 10 point review of systems is negative, except for the pertinent positives and negatives detailed in the HPI.  Past Medical History: Past Medical History:  Diagnosis Date   Frequent headaches    Thyroid disease     Past Surgical History: Past Surgical History:  Procedure Laterality Date   NO PAST SURGERIES      Allergies: Allergies as of 09/20/2022   (No Known Allergies)    Medications: No outpatient medications have been marked as taking for the 09/20/22 encounter (Office Visit) with 11/19/2022, MD.    Social History: Social History   Tobacco Use   Smoking status: Never   Smokeless tobacco: Never  Vaping Use   Vaping Use: Never used  Substance Use Topics   Alcohol use: Yes    Comment: occasionally   Drug use: No    Family Medical History: Family History  Problem Relation Age of Onset   Arthritis Mother    Bipolar disorder Father    Cancer Paternal Grandmother        Lung  ADD / ADHD Sister    ADD / ADHD Brother    Heart disease Maternal Uncle    Arthritis Maternal Grandfather    Hypertension Maternal Grandfather    Alcohol abuse Maternal Grandfather     Physical Examination: Vitals:   09/20/22 1311  BP: 116/74  Pulse: 70    General: Patient is well developed, well nourished, calm, collected, and in no apparent distress. Attention to examination is appropriate.  Neck:   Supple.  Full range of motion.  Respiratory: Patient is breathing without any difficulty.   NEUROLOGICAL:     Awake, alert, oriented to person,  place, and time.  Speech is clear and fluent.   Cranial Nerves: Pupils equal round and reactive to light.  Facial tone is symmetric.  Facial sensation is symmetric. Shoulder shrug is symmetric. Tongue protrusion is midline.  There is no pronator drift.  ROM of spine: full.    Strength: Side Biceps Triceps Deltoid Interossei Grip Wrist Ext. Wrist Flex.  R 5 5 5 5 5 5 5   L 5 5 5 5 5 5 5    Side Iliopsoas Quads Hamstring PF DF EHL  R 5 5 5 5 5 5   L 5 5 5 5 5 5    Reflexes are 1+ and symmetric at the biceps, triceps, brachioradialis, patella and achilles.   Hoffman's is absent.   Bilateral upper and lower extremity sensation is intact to light touch.    No evidence of dysmetria noted.  Gait is normal.     Medical Decision Making  Imaging: MRI L spine 07/06/2022 IMPRESSION: 1. Large central to left subarticular disc protrusion at L5-S1 with resultant severe canal and left worse than right lateral recess stenosis. 2. Small central disc protrusion at L4-5 with resultant mild bilateral lateral recess stenosis. 3. Central to right paracentral disc protrusion at L2-3 without significant stenosis or impingement.     Electronically Signed   By: Jeannine Boga M.D.   On: 07/06/2022 21:56  I have personally reviewed the images and agree with the above interpretation.  Assessment and Plan: Danielle Morgan is a pleasant 27 y.o. 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  She has had symptoms now for 9 months.  She has tried physical therapy and injections without improvement.  She did have a 3-week period of improvement after her first injection, but her pain has returned.  At this point, she has exhausted conservative management.  While pursuing an additional injection is certainly an option, I do not think it is likely to provide a long-term solution.  We discussed surgical intervention with an L5-S1 microdiscectomy.  I think that is the most likely  approach at this point to provide resolution of her symptoms.    She is meeting with Dr. Alba Destine for follow-up next week.  She will let me know if she would like to proceed with an additional injection or pursue surgery.  I discussed the planned procedure at length with the patient, including the risks, benefits, alternatives, and indications. The risks discussed include but are not limited to bleeding, infection, need for reoperation, spinal fluid leak, stroke, vision loss, anesthetic complication, coma, paralysis, and even death. I also described in detail that improvement was not guaranteed.  The patient expressed understanding of these risks. I described the surgery in layman's terms, and gave ample opportunity for questions, which were answered to the best of my ability.    Thank you for involving me in the care of  this patient.      Safal Halderman K. Izora Ribas MD, [facility] Neurosurgery

## 2022-09-19 NOTE — Addendum Note (Signed)
Addended byGeronimo Boot on: 09/19/2022 03:24 PM   Modules accepted: Orders

## 2022-09-20 ENCOUNTER — Ambulatory Visit (INDEPENDENT_AMBULATORY_CARE_PROVIDER_SITE_OTHER): Payer: BC Managed Care – PPO | Admitting: Neurosurgery

## 2022-09-20 ENCOUNTER — Encounter: Payer: Self-pay | Admitting: Neurosurgery

## 2022-09-20 VITALS — BP 116/74 | HR 70 | Ht 65.0 in | Wt 217.2 lb

## 2022-09-20 DIAGNOSIS — M5416 Radiculopathy, lumbar region: Secondary | ICD-10-CM

## 2022-09-25 DIAGNOSIS — M5416 Radiculopathy, lumbar region: Secondary | ICD-10-CM

## 2022-09-25 DIAGNOSIS — G8929 Other chronic pain: Secondary | ICD-10-CM | POA: Diagnosis not present

## 2022-09-25 DIAGNOSIS — M5442 Lumbago with sciatica, left side: Secondary | ICD-10-CM | POA: Diagnosis not present

## 2022-09-25 DIAGNOSIS — M48062 Spinal stenosis, lumbar region with neurogenic claudication: Secondary | ICD-10-CM | POA: Diagnosis not present

## 2022-09-25 DIAGNOSIS — M5126 Other intervertebral disc displacement, lumbar region: Secondary | ICD-10-CM

## 2022-09-26 MED ORDER — MELOXICAM 15 MG PO TABS: 15.0000 mg | ORAL_TABLET | Freq: Every day | ORAL | 2 refills | Status: AC

## 2022-09-26 NOTE — Addendum Note (Signed)
Addended byGeronimo Boot on: 09/26/2022 02:33 PM   Modules accepted: Orders

## 2022-10-11 DIAGNOSIS — M5442 Lumbago with sciatica, left side: Secondary | ICD-10-CM | POA: Diagnosis not present

## 2022-10-11 DIAGNOSIS — M48062 Spinal stenosis, lumbar region with neurogenic claudication: Secondary | ICD-10-CM | POA: Diagnosis not present

## 2022-10-16 ENCOUNTER — Ambulatory Visit: Payer: BC Managed Care – PPO | Admitting: Obstetrics and Gynecology

## 2022-11-13 ENCOUNTER — Encounter: Payer: Self-pay | Admitting: Emergency Medicine

## 2022-11-13 ENCOUNTER — Emergency Department
Admission: EM | Admit: 2022-11-13 | Discharge: 2022-11-13 | Disposition: A | Discharge status: Home / Self Care | Payer: BC Managed Care – PPO | Attending: Emergency Medicine | Admitting: Emergency Medicine

## 2022-11-13 ENCOUNTER — Other Ambulatory Visit: Payer: Self-pay

## 2022-11-13 DIAGNOSIS — R42 Dizziness and giddiness: Secondary | ICD-10-CM | POA: Diagnosis not present

## 2022-11-13 DIAGNOSIS — Z1152 Encounter for screening for COVID-19: Secondary | ICD-10-CM | POA: Insufficient documentation

## 2022-11-13 DIAGNOSIS — R55 Syncope and collapse: Secondary | ICD-10-CM | POA: Diagnosis not present

## 2022-11-13 LAB — CBC
HCT: 43 % (ref 36.0–46.0)
Hemoglobin: 13.7 g/dL (ref 12.0–15.0)
MCH: 29.5 pg (ref 26.0–34.0)
MCHC: 31.9 g/dL (ref 30.0–36.0)
MCV: 92.5 fL (ref 80.0–100.0)
Platelets: 303 10*3/uL (ref 150–400)
RBC: 4.65 MIL/uL (ref 3.87–5.11)
RDW: 13.3 % (ref 11.5–15.5)
WBC: 5.4 10*3/uL (ref 4.0–10.5)
nRBC: 0 % (ref 0.0–0.2)

## 2022-11-13 LAB — COMPREHENSIVE METABOLIC PANEL
ALT: 18 U/L (ref 0–44)
AST: 24 U/L (ref 15–41)
Albumin: 4 g/dL (ref 3.5–5.0)
Alkaline Phosphatase: 65 U/L (ref 38–126)
Anion gap: 7 (ref 5–15)
BUN: 8 mg/dL (ref 6–20)
CO2: 23 mmol/L (ref 22–32)
Calcium: 9 mg/dL (ref 8.9–10.3)
Chloride: 108 mmol/L (ref 98–111)
Creatinine, Ser: 0.61 mg/dL (ref 0.44–1.00)
GFR, Estimated: >60 mL/min (ref >60)
Glucose, Bld: 98 mg/dL (ref 70–99)
Potassium: 3.8 mmol/L (ref 3.5–5.1)
Sodium: 138 mmol/L (ref 135–145)
Total Bilirubin: 0.5 mg/dL (ref 0.3–1.2)
Total Protein: 7.9 g/dL (ref 6.5–8.1)

## 2022-11-13 LAB — POC URINE PREG, ED: Preg Test, Ur: NEGATIVE

## 2022-11-13 LAB — RESP PANEL BY RT-PCR (RSV, FLU A&B, COVID)  RVPGX2
Influenza A by PCR: NEGATIVE
Influenza B by PCR: NEGATIVE
Resp Syncytial Virus by PCR: NEGATIVE
SARS Coronavirus 2 by RT PCR: NEGATIVE

## 2022-11-13 LAB — TROPONIN I (HIGH SENSITIVITY): Troponin I (High Sensitivity): <2 ng/L (ref <18)

## 2022-11-13 MED ORDER — SODIUM CHLORIDE 0.9 % IV SOLN: Freq: Once | INTRAVENOUS | Status: AC

## 2022-11-13 NOTE — ED Triage Notes (Signed)
Pt reports waking up feeling heart palpitations, sweaty and near syncopal. Pt ambulated into triage. Denies n/v/d. 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

## 2022-11-13 NOTE — ED Provider Notes (Signed)
   [facility] Provider Note    Event Date/Time   First MD Initiated Contact with Patient 11/13/22 [phone]     (approximate)   History   Near Syncope   HPI  Danielle Morgan is a 27 y.o. female who reports that she woke up this morning and had palpitations, felt sweaty and lightheaded.  She went back to sleep and was feeling improved but however when she was walking around getting ready she started to feel lightheaded again.  She denies chest pain.  No history of arrhythmia, no new medications.  No abdominal pain or pleurisy     Physical Exam   Triage Vital Signs: ED Triage Vitals [11/13/22 0745]  Enc Vitals Group     BP 122/89     Pulse Rate 97     Resp 16     Temp 98.2 F (36.8 C)     Temp Source Oral     SpO2 97 %     Weight 98.4 kg (217 lb)     Height 1.651 m (5' 5"$ )     Head Circumference      Peak Flow      Pain Score 0     Pain Loc      Pain Edu?      Excl. in [place]?     Most recent vital signs: Vitals:   11/13/22 0745  BP: 122/89  Pulse: 97  Resp: 16  Temp: 98.2 F (36.8 C)  SpO2: 97%     General: Awake, no distress.  CV:  Good peripheral perfusion.  Resp:  Normal effort.  Abd:  No distention.  Other:     ED Results / Procedures / Treatments   Labs (all labs ordered are listed, but only abnormal results are displayed) Labs Reviewed  RESP PANEL BY RT-PCR (RSV, FLU A&B, COVID)  [id number]  CBC  COMPREHENSIVE METABOLIC PANEL  POC URINE PREG, ED  TROPONIN I (HIGH SENSITIVITY)     EKG  ED ECG REPORT I, Lavonia Drafts, the attending physician, personally viewed and interpreted this ECG.  Date: 11/13/2022  Rhythm: normal sinus rhythm QRS Axis: normal Intervals: normal ST/T Wave abnormalities: normal Narrative Interpretation: no evidence of acute ischemia    RADIOLOGY     PROCEDURES:  Critical Care performed:   Procedures   MEDICATIONS ORDERED IN ED: Medications  0.9 %  sodium chloride infusion (  Intravenous New Bag/Given 11/13/22 0818)     IMPRESSION / MDM / ASSESSMENT AND PLAN / ED COURSE  I reviewed the triage vital signs and the nursing notes. Patient's presentation is most consistent with acute presentation with potential threat to life or bodily function.  Patient presents after a near syncopal episode with description of palpitations.  Differential includes dehydration, vasovagal, possible viral illness, electrolyte abnormality  Will obtain labs, EKG, give IV fluids and reevaluate.  Lab work reviewed and is overall quite reassuring, patient is feeling better after IV fluids, pregnancy test negative, COVID flu negative.  Appropriate for discharge at this time with outpatient follow-up, return precautions discussed, no indication for admission      FINAL CLINICAL IMPRESSION(S) / ED DIAGNOSES   Final diagnoses:  Near syncope     Rx / DC Orders   ED Discharge Orders     None        Note:  This document was prepared using Dragon voice recognition software and may include unintentional dictation errors.   Lavonia Drafts, MD 11/13/22 [phone]

## 2022-11-15 ENCOUNTER — Encounter: Payer: Self-pay | Admitting: Internal Medicine

## 2022-11-15 ENCOUNTER — Ambulatory Visit (INDEPENDENT_AMBULATORY_CARE_PROVIDER_SITE_OTHER): Payer: BC Managed Care – PPO | Admitting: Internal Medicine

## 2022-11-15 VITALS — BP 114/68 | HR 63 | Temp 96.8°F | Wt 213.0 lb

## 2022-11-15 DIAGNOSIS — E039 Hypothyroidism, unspecified: Secondary | ICD-10-CM

## 2022-11-15 DIAGNOSIS — R55 Syncope and collapse: Secondary | ICD-10-CM

## 2022-11-15 DIAGNOSIS — R6889 Other general symptoms and signs: Secondary | ICD-10-CM | POA: Diagnosis not present

## 2022-11-15 DIAGNOSIS — R002 Palpitations: Secondary | ICD-10-CM

## 2022-11-15 DIAGNOSIS — E7800 Pure hypercholesterolemia, unspecified: Secondary | ICD-10-CM

## 2022-11-15 DIAGNOSIS — R7309 Other abnormal glucose: Secondary | ICD-10-CM

## 2022-11-15 DIAGNOSIS — R42 Dizziness and giddiness: Secondary | ICD-10-CM

## 2022-11-15 NOTE — Patient Instructions (Signed)
Near-Syncope Near-syncope is when you suddenly feel like you might pass out or faint. This may also be called presyncope. During an episode of near-syncope, you may: Feel dizzy, weak, or light-headed. It may feel like the room is spinning. Feel like you may vomit (nauseous). See spots or see all white or all black. Have cold, clammy skin. Feel warm and sweaty. Hear ringing in your ears. This condition is caused by a sudden decrease in blood flow to the brain. This can result from many causes, but most of those causes are not dangerous. However, near-syncope may be a sign of a serious medical problem, so it is important to seek medical care. Follow these instructions at home: Medicines Take over-the-counter and prescription medicines only as told by your doctor. If you are taking blood pressure or heart medicine, get up slowly and spend many minutes getting ready to sit and then stand. This can help with dizziness. Lifestyle Do not drive, use machinery, or play sports until your doctor says it is okay. Do not drink alcohol. Do not smoke or use any products that contain nicotine or tobacco. If you need help quitting, ask your doctor. Avoid hot tubs and saunas. General instructions Be aware of any changes in your symptoms. Talk with your doctor about your symptoms. You may need to have testing to help find the cause. If you start to feel like you might pass out, sit or lie down right away. If sitting, lower your head down between your legs. If lying down, raise (elevate) your feet above the level of your heart. Breathe deeply and steadily. Wait until all of the symptoms are gone. Have someone stay with you until you feel better. Drink enough fluid to keep your pee (urine) pale yellow. Avoid standing for a long time. If you must stand for a long time, do movements such as: Moving your legs. Crossing your legs. Flexing and stretching your leg muscles. Squatting. Keep all follow-up  visits. Contact a doctor if: You continue to have episodes of near fainting. Get help right away if: You pass out or faint. You have any of these symptoms: Fast or uneven heartbeats (palpitations). Pain in your chest, belly, or back. Shortness of breath. You have a seizure. You have a very bad headache. You are confused. You have trouble seeing. You are very weak. You have trouble walking. You are bleeding from your mouth or butt. You have black or tarry poop (stool). These symptoms may be an emergency. Get help right away. Call your local emergency services (911 in the [place] Do not wait to see if the symptoms will go away. Do not drive yourself to the hospital. Summary Near-syncope is when you suddenly feel like you might pass out or faint. This condition is caused by a sudden decrease in blood flow to the brain. Near-syncope may be a sign of a serious medical problem, so it is important to seek medical care. If you start to feel like you might pass out, sit or lie down right away. If sitting, lower your head down between your legs. If lying down, raise (elevate) your feet above the level of your heart. Talk with your doctor about your symptoms. You may need to have testing to help find the cause. This information is not intended to replace advice given to you by your health care provider. Make sure you discuss any questions you have with your health care provider. Document Revised: 01/12/2021 Document Reviewed: 01/12/2021 Elsevier Patient Education  2023   Elsevier Inc[employer]  

## 2022-11-15 NOTE — Progress Notes (Signed)
Subjective:    Patient ID: Danielle Morgan, female    DOB: [birth year], 27 y.o.   MRN: [id number]  HPI  Patient presents to clinic today for ER follow-up.  She presented to the ER 2/27 with complaint of palpitations, sweatiness and lightheadedness.  CBC, c-Met, urine pregnancy, COVID/flu/RSV were all negative.  ECG did not show any acute findings.  She was treated with a liter of IV fluids.  She was discharged advised to follow-up with her PCP.  Since that time, she reports she has not had any persistent symptoms. She does want to mention in the last 2 weeks that intermittently one of her upper or lower extremities was cold. She has not noticed any consistently with this. She has not noticed any discoloration in hands but has noticed discoloration in her feet. She denies numbness, tingling or weakness. She reports her mom has a history of Raynauds. She has a personal history of hypothyroidism and is taking Levothyroxine as prescribed.   Review of Systems     Past Medical History:  Diagnosis Date   Frequent headaches    Thyroid disease     Current Outpatient Medications  Medication Sig Dispense Refill   gabapentin (NEURONTIN) 300 MG capsule Take '300mg'$  in the morning, '300mg'$  at lunch, and '600mg'$  at night. 120 capsule 1   levothyroxine (SYNTHROID) 88 MCG tablet TAKE 1 TABLET BY MOUTH EVERY DAY 90 tablet 2   meloxicam (MOBIC) 15 MG tablet Take 1 tablet (15 mg total) by mouth daily. Take with food. 30 tablet 2   methocarbamol (ROBAXIN) 500 MG tablet Take 1 tablet (500 mg total) by mouth every 8 (eight) hours as needed for muscle spasms. 60 tablet 0   No current facility-administered medications for this visit.    No Known Allergies  Family History  Problem Relation Age of Onset   Arthritis Mother    Bipolar disorder Father    Cancer Paternal Grandmother        Lung   ADD / ADHD Sister    ADD / ADHD Brother    Heart disease Maternal Uncle    Arthritis Maternal Grandfather     Hypertension Maternal Grandfather    Alcohol abuse Maternal Grandfather     Social History   Socioeconomic History   Marital status: Married    Spouse name: Not on file   Number of children: Not on file   Years of education: Not on file   Highest education level: Not on file  Occupational History   Not on file  Tobacco Use   Smoking status: Never   Smokeless tobacco: Never  Vaping Use   Vaping Use: Never used  Substance and Sexual Activity   Alcohol use: Yes    Comment: occasionally   Drug use: No   Sexual activity: Yes    Partners: Male    Birth control/protection: I.U.D.  Other Topics Concern   Not on file  Social History Narrative   Not on file   Social Determinants of Health   Financial Resource Strain: Not on file  Food Insecurity: Not on file  Transportation Needs: Not on file  Physical Activity: Not on file  Stress: Not on file  Social Connections: Not on file  Intimate Partner Violence: Not on file     Constitutional: Denies fever, malaise, fatigue, headache or abrupt weight changes.  HEENT: Denies eye pain, eye redness, ear pain, ringing in the ears, wax buildup, runny nose, nasal congestion, bloody nose, or sore throat. Respiratory:  Denies difficulty breathing, shortness of breath, cough or sputum production.   Cardiovascular: Pt reports intermittent cold intolerance of hands and feet. Denies chest pain, chest tightness, palpitations or swelling in the hands or feet.  Gastrointestinal: Denies abdominal pain, bloating, constipation, diarrhea or blood in the stool.  GU: Denies urgency, frequency, pain with urination, burning sensation, blood in urine, odor or discharge. Musculoskeletal: Denies decrease in range of motion, difficulty with gait, muscle pain or joint pain and swelling.  Skin: Pt reports blue discoloration to feet. Denies redness, rashes, lesions or ulcercations.  Neurological: Denies dizziness, difficulty with memory, difficulty with speech or  problems with balance and coordination.  Psych: Denies anxiety, depression, SI/HI.  No other specific complaints in a complete review of systems (except as listed in HPI above).  Objective:   Physical Exam   BP 114/68 (BP Location: Left Arm, Patient Position: Sitting, Cuff Size: Large)   Pulse 63   Temp (!) 96.8 F (36 C) (Temporal)   Wt 213 lb (96.6 kg)   LMP 10/21/2022   SpO2 100%   BMI 35.45 kg/m   Wt Readings from Last 3 Encounters:  11/13/22 217 lb (98.4 kg)  09/20/22 217 lb 3.2 oz (98.5 kg)  08/14/22 209 lb (94.8 kg)    General: Appears her stated age, obese, in NAD. Skin: Warm, dry and intact. No discoloration, rashes, lesions or ulcerations noted. Neck:  Neck supple, trachea midline. No masses, lumps or thyromegaly present.  Cardiovascular: Normal rate and rhythm. S1,S2 noted.  No murmur, rubs or gallops noted. Radial and pedal pulses 2+ pulses. Pulmonary/Chest: Normal effort and positive vesicular breath sounds. No respiratory distress. No wheezes, rales or ronchi noted.  Musculoskeletal:  No difficulty with gait.  Neurological: Alert and oriented. Sensation intact to BUE/BLE. Coordination normal.     BMET    Component Value Date/Time   NA 138 11/13/2022 0808   NA 137 10/23/2018 1044   K 3.8 11/13/2022 0808   CL 108 11/13/2022 0808   CO2 23 11/13/2022 0808   GLUCOSE 98 11/13/2022 0808   BUN 8 11/13/2022 0808   BUN 10 10/23/2018 1044   CREATININE 0.61 11/13/2022 0808   CREATININE 0.64 04/12/2022 1337   CALCIUM 9.0 11/13/2022 0808   GFRNONAA >60 11/13/2022 0808   GFRAA >60 01/13/2020 1826    Lipid Panel     Component Value Date/Time   CHOL 191 04/12/2022 1337   CHOL 167 10/23/2018 1044   TRIG 82 04/12/2022 1337   HDL 51 04/12/2022 1337   HDL 52 10/23/2018 1044   CHOLHDL 3.7 04/12/2022 1337   VLDL 34.0 12/30/2019 1510   LDLCALC 122 (H) 04/12/2022 1337    CBC    Component Value Date/Time   WBC 5.4 11/13/2022 0808   RBC 4.65 11/13/2022 0808    HGB 13.7 11/13/2022 0808   HGB 11.3 10/28/2019 0823   HCT 43.0 11/13/2022 0808   HCT 34.6 10/28/2019 0823   PLT 303 11/13/2022 0808   PLT 242 10/28/2019 0823   MCV 92.5 11/13/2022 0808   MCV 95 10/28/2019 0823   MCH 29.5 11/13/2022 0808   MCHC 31.9 11/13/2022 0808   RDW 13.3 11/13/2022 0808   RDW 11.6 (L) 10/28/2019 0823   LYMPHSABS 1.8 07/07/2019 1535   MONOABS 0.9 04/26/2015 1053   EOSABS 0.1 07/07/2019 1535   BASOSABS 0.0 07/07/2019 1535    Hgb A1C Lab Results  Component Value Date   HGBA1C 5.4 08/31/2021  Assessment & Plan:   ER follow-up for Palpitations, Lightheadedness, Near-Syncope:  ER notes, labs and procedures reviewed Make sure you are drinking plenty of fluids Make position changes slowly Will check TSH, Lipid and A1C  Cold Intolerance, Hypothyroidism:  Will check TSH, Free T4 Circulation is intact There is a possibility of Raynauds, but no diagnostic evaluation to be done for this Keep hands and feet as warm as possible especially when it is cold outside  Schedule an appointment for your annual exam Webb Silversmith, NP

## 2022-11-21 ENCOUNTER — Ambulatory Visit (INDEPENDENT_AMBULATORY_CARE_PROVIDER_SITE_OTHER): Payer: BC Managed Care – PPO | Admitting: Family Medicine

## 2022-11-21 ENCOUNTER — Encounter: Payer: Self-pay | Admitting: Family Medicine

## 2022-11-21 VITALS — BP 123/83 | HR 74 | Wt 212.0 lb

## 2022-11-21 DIAGNOSIS — R6882 Decreased libido: Secondary | ICD-10-CM

## 2022-11-21 DIAGNOSIS — E039 Hypothyroidism, unspecified: Secondary | ICD-10-CM | POA: Diagnosis not present

## 2022-11-21 DIAGNOSIS — N8110 Cystocele, unspecified: Secondary | ICD-10-CM | POA: Insufficient documentation

## 2022-11-21 NOTE — Assessment & Plan Note (Signed)
Have thyroid checked to ensure no issues there.

## 2022-11-21 NOTE — Assessment & Plan Note (Signed)
On no meds that contribute to this. Discussed options and reasons likely health and life related.

## 2022-11-21 NOTE — Progress Notes (Signed)
   Subjective:    Patient ID: Danielle Morgan is a 27 y.o. female presenting with No chief complaint on file.  on 11/21/2022  HPI: Has a loss of libido in the last few months, certainly has worsened.  Also, has some prolapse. Mostly has symptoms with her cycle. Sometimes she feels her IUD string poking out. Discussing having another baby. Has new herniated disc with pain. S/p injection for this.  Review of Systems  Constitutional:  Negative for chills and fever.  Respiratory:  Negative for shortness of breath.   Cardiovascular:  Negative for chest pain.  Gastrointestinal:  Negative for abdominal pain, nausea and vomiting.  Genitourinary:  Negative for dysuria.  Skin:  Negative for rash.      Objective:    BP 123/83   Pulse 74   Wt 212 lb (96.2 kg)   LMP 10/21/2022   BMI 35.28 kg/m  Physical Exam Exam conducted with a chaperone present.  Constitutional:      General: She is not in acute distress.    Appearance: She is well-developed.  HENT:     Head: Normocephalic and atraumatic.  Eyes:     General: No scleral icterus. Cardiovascular:     Rate and Rhythm: Normal rate.  Pulmonary:     Effort: Pulmonary effort is normal.  Abdominal:     Palpations: Abdomen is soft.  Genitourinary:    General: Normal vulva.     Comments: Minimal dissent of bladder. Cervix without lesion, IUD strings noted. No rectocele, no uterine/cervical dissent. Musculoskeletal:     Cervical back: Neck supple.  Skin:    General: Skin is warm and dry.  Neurological:     Mental Status: She is alert and oriented to person, place, and time.         Assessment & Plan:   Problem List Items Addressed This Visit       Unprioritized   Hypothyroidism - Primary    Have thyroid checked to ensure no issues there.      Low libido    On no meds that contribute to this. Discussed options and reasons likely health and life related.       Female cystocele    Kegel's daily. Offered pelvic PT, but is  not absolutely necessary, she declined.      Wants to continue IUD  Return in about 1 year (around 11/21/2023) for a CPE.  Danielle Jude, MD 11/21/2022 10:10 AM

## 2022-11-21 NOTE — Progress Notes (Signed)
Check prolapse, discuss low libido, back pain during period

## 2022-11-21 NOTE — Assessment & Plan Note (Signed)
Kegel's daily. Offered pelvic PT, but is not absolutely necessary, she declined.

## 2022-11-27 ENCOUNTER — Ambulatory Visit (INDEPENDENT_AMBULATORY_CARE_PROVIDER_SITE_OTHER): Payer: BC Managed Care – PPO | Admitting: Internal Medicine

## 2022-11-27 ENCOUNTER — Encounter: Payer: Self-pay | Admitting: Internal Medicine

## 2022-11-27 VITALS — BP 126/78 | HR 63 | Temp 96.8°F | Ht 65.0 in | Wt 215.0 lb

## 2022-11-27 DIAGNOSIS — R6889 Other general symptoms and signs: Secondary | ICD-10-CM | POA: Diagnosis not present

## 2022-11-27 DIAGNOSIS — E7800 Pure hypercholesterolemia, unspecified: Secondary | ICD-10-CM | POA: Diagnosis not present

## 2022-11-27 DIAGNOSIS — R7309 Other abnormal glucose: Secondary | ICD-10-CM | POA: Diagnosis not present

## 2022-11-27 DIAGNOSIS — Z6835 Body mass index (BMI) 35.0-35.9, adult: Secondary | ICD-10-CM | POA: Diagnosis not present

## 2022-11-27 DIAGNOSIS — Z0001 Encounter for general adult medical examination with abnormal findings: Secondary | ICD-10-CM

## 2022-11-27 DIAGNOSIS — E039 Hypothyroidism, unspecified: Secondary | ICD-10-CM | POA: Diagnosis not present

## 2022-11-27 MED ORDER — PHENTERMINE HCL 37.5 MG PO CAPS: 37.5000 mg | ORAL_CAPSULE | ORAL | 0 refills | Status: DC

## 2022-11-27 NOTE — Patient Instructions (Signed)

## 2022-11-27 NOTE — Assessment & Plan Note (Signed)
Encourage diet and exercise for weight loss We will restart phentermine She will consider the zepbound, Wegovy or Saxenda

## 2022-11-27 NOTE — Progress Notes (Signed)
Subjective:    Patient ID: Danielle Morgan, female    DOB: December 11, 1995, 27 y.o.   MRN: [id number]  HPI  Patient presents to clinic today for her annual exam.  Flu: 06/2019 Tetanus: 10/2019 COVID: Never Pap smear: 06/2021 Dentist: biannually  Diet: She does eat meat. She consumes fruits and veggies. She does eat some fried foods. She drinks mostly water. Exercise: 3 x week, walking  Review of Systems  Past Medical History:  Diagnosis Date   Frequent headaches    Thyroid disease     Current Outpatient Medications  Medication Sig Dispense Refill   gabapentin (NEURONTIN) 300 MG capsule Take '300mg'$  in the morning, '300mg'$  at lunch, and '600mg'$  at night. (Patient not taking: Reported on 11/15/2022) 120 capsule 1   levothyroxine (SYNTHROID) 88 MCG tablet TAKE 1 TABLET BY MOUTH EVERY DAY 90 tablet 2   meloxicam (MOBIC) 15 MG tablet Take 1 tablet (15 mg total) by mouth daily. Take with food. (Patient not taking: Reported on 11/21/2022) 30 tablet 2   methocarbamol (ROBAXIN) 500 MG tablet Take 1 tablet (500 mg total) by mouth every 8 (eight) hours as needed for muscle spasms. (Patient not taking: Reported on 11/21/2022) 60 tablet 0   phentermine 37.5 MG capsule Take 37.5 mg by mouth every morning.     No current facility-administered medications for this visit.    No Known Allergies  Family History  Problem Relation Age of Onset   Arthritis Mother    Bipolar disorder Father    Cancer Paternal Grandmother        Lung   ADD / ADHD Sister    ADD / ADHD Brother    Heart disease Maternal Uncle    Arthritis Maternal Grandfather    Hypertension Maternal Grandfather    Alcohol abuse Maternal Grandfather     Social History   Socioeconomic History   Marital status: Married    Spouse name: Not on file   Number of children: Not on file   Years of education: Not on file   Highest education level: Not on file  Occupational History   Not on file  Tobacco Use   Smoking status: Never    Smokeless tobacco: Never  Vaping Use   Vaping Use: Never used  Substance and Sexual Activity   Alcohol use: Yes    Comment: occasionally   Drug use: No   Sexual activity: Yes    Partners: Male    Birth control/protection: I.U.D.  Other Topics Concern   Not on file  Social History Narrative   Not on file   Social Determinants of Health   Financial Resource Strain: Not on file  Food Insecurity: Not on file  Transportation Needs: Not on file  Physical Activity: Not on file  Stress: Not on file  Social Connections: Not on file  Intimate Partner Violence: Not on file     Constitutional: Patient reports intermittent headaches.  Denies fever, malaise, fatigue, or abrupt weight changes.  HEENT: Denies eye pain, eye redness, ear pain, ringing in the ears, wax buildup, runny nose, nasal congestion, bloody nose, or sore throat. Respiratory: Denies difficulty breathing, shortness of breath, cough or sputum production.   Cardiovascular: Denies chest pain, chest tightness, palpitations or swelling in the hands or feet.  Gastrointestinal: Denies abdominal pain, bloating, constipation, diarrhea or blood in the stool.  GU: Denies urgency, frequency, pain with urination, burning sensation, blood in urine, odor or discharge. Musculoskeletal: Patient reports chronic low back pain.  Denies  decrease in range of motion, difficulty with gait, or joint swelling.  Skin: Denies redness, rashes, lesions or ulcercations.  Neurological: Denies dizziness, difficulty with memory, difficulty with speech or problems with balance and coordination.  Psych: Denies anxiety, depression, SI/HI.  No other specific complaints in a complete review of systems (except as listed in HPI above).     Objective:   Physical Exam  BP 126/78 (BP Location: Left Arm, Patient Position: Sitting, Cuff Size: Large)   Pulse 63   Temp (!) 96.8 F (36 C) (Temporal)   Ht '5\' 5"'$  (1.651 m)   Wt 215 lb (97.5 kg)   LMP 10/21/2022    SpO2 97%   BMI 35.78 kg/m   Wt Readings from Last 3 Encounters:  11/21/22 212 lb (96.2 kg)  11/15/22 213 lb (96.6 kg)  11/13/22 217 lb (98.4 kg)    General: Appears her stated age, obese, in NAD. Skin: Warm, dry and intact.  HEENT: Head: normal shape and size; Eyes: sclera white, no icterus, conjunctiva pink, PERRLA and EOMs intact;  Neck:  Neck supple, trachea midline. No masses, lumps or thyromegaly present.  Cardiovascular: Normal rate and rhythm. S1,S2 noted.  No murmur, rubs or gallops noted. No JVD or BLE edema.  Pulmonary/Chest: Normal effort and positive vesicular breath sounds. No respiratory distress. No wheezes, rales or ronchi noted.  Abdomen: Normal bowel sounds.  Musculoskeletal: Strength 5/5 BUe/BLE. No difficulty with gait.  Neurological: Alert and oriented. Cranial nerves II-XII grossly intact. Coordination normal.  Psychiatric: Mood and affect normal. Behavior is normal. Judgment and thought content normal.    BMET    Component Value Date/Time   NA 138 11/13/2022 0808   NA 137 10/23/2018 1044   K 3.8 11/13/2022 0808   CL 108 11/13/2022 0808   CO2 23 11/13/2022 0808   GLUCOSE 98 11/13/2022 0808   BUN 8 11/13/2022 0808   BUN 10 10/23/2018 1044   CREATININE 0.61 11/13/2022 0808   CREATININE 0.64 04/12/2022 1337   CALCIUM 9.0 11/13/2022 0808   GFRNONAA >60 11/13/2022 0808   GFRAA >60 01/13/2020 1826    Lipid Panel     Component Value Date/Time   CHOL 191 04/12/2022 1337   CHOL 167 10/23/2018 1044   TRIG 82 04/12/2022 1337   HDL 51 04/12/2022 1337   HDL 52 10/23/2018 1044   CHOLHDL 3.7 04/12/2022 1337   VLDL 34.0 12/30/2019 1510   LDLCALC 122 (H) 04/12/2022 1337    CBC    Component Value Date/Time   WBC 5.4 11/13/2022 0808   RBC 4.65 11/13/2022 0808   HGB 13.7 11/13/2022 0808   HGB 11.3 10/28/2019 0823   HCT 43.0 11/13/2022 0808   HCT 34.6 10/28/2019 0823   PLT 303 11/13/2022 0808   PLT 242 10/28/2019 0823   MCV 92.5 11/13/2022 0808   MCV  95 10/28/2019 0823   MCH 29.5 11/13/2022 0808   MCHC 31.9 11/13/2022 0808   RDW 13.3 11/13/2022 0808   RDW 11.6 (L) 10/28/2019 0823   LYMPHSABS 1.8 07/07/2019 1535   MONOABS 0.9 04/26/2015 1053   EOSABS 0.1 07/07/2019 1535   BASOSABS 0.0 07/07/2019 1535    Hgb A1C Lab Results  Component Value Date   HGBA1C 5.4 08/31/2021           Assessment & Plan:   Preventative Health Maintenance:  Encouraged her to get a flu shot in the fall Tetanus UTD Encouraged her to get her covid booster Pap smear UTD Encouraged her  to consume a balanced diet and exercise regimen Advised her to see a dentist annually Will check TSH, Free T4, Lipid and A1C. CBC and CMET reviewed  RTC in 6 months, follow up chronic conditions Webb Silversmith, NP

## 2022-11-28 ENCOUNTER — Encounter: Payer: Self-pay | Admitting: Internal Medicine

## 2022-11-28 DIAGNOSIS — E039 Hypothyroidism, unspecified: Secondary | ICD-10-CM

## 2022-11-28 LAB — HEMOGLOBIN A1C
Hgb A1c MFr Bld: 5.4 % of total Hgb (ref <5.7)
Mean Plasma Glucose: 108 mg/dL
eAG (mmol/L): 6 mmol/L

## 2022-11-28 LAB — LIPID PANEL
Cholesterol: 185 mg/dL (ref <200)
HDL: 54 mg/dL (ref >=50)
LDL Cholesterol (Calc): 113 mg/dL (calc) — ABNORMAL HIGH
Non-HDL Cholesterol (Calc): 131 mg/dL (calc) — ABNORMAL HIGH (ref <130)
Total CHOL/HDL Ratio: 3.4 (calc) (ref <5.0)
Triglycerides: 82 mg/dL (ref <150)

## 2022-11-28 LAB — TSH: TSH: 5.97 mIU/L — ABNORMAL HIGH

## 2022-11-28 LAB — T4, FREE: Free T4: 1 ng/dL (ref 0.8–1.8)

## 2022-11-29 MED ORDER — LEVOTHYROXINE SODIUM 100 MCG PO TABS: 100.0000 ug | ORAL_TABLET | Freq: Every day | ORAL | 0 refills | Status: DC

## 2022-11-29 MED ORDER — SEMAGLUTIDE-WEIGHT MANAGEMENT 0.25 MG/0.5ML ~~LOC~~ SOAJ: 0.2500 mg | SUBCUTANEOUS | 0 refills | Status: DC

## 2022-11-29 NOTE — Addendum Note (Signed)
Addended by: Jearld Fenton on: 11/29/2022 11:48 AM   Modules accepted: Orders

## 2022-12-06 ENCOUNTER — Telehealth: Payer: Self-pay | Admitting: Internal Medicine

## 2022-12-06 NOTE — Telephone Encounter (Signed)
See My Chart Message    Thanks,   -Jariana Shumard  

## 2022-12-06 NOTE — Telephone Encounter (Signed)
Patient called in, waiting for status of PA for [facility]. Please call back

## 2022-12-06 NOTE — Telephone Encounter (Signed)
PA has been submitted to pt's insurance.   Thanks,   -Wendelin Reader  

## 2022-12-21 ENCOUNTER — Telehealth (INDEPENDENT_AMBULATORY_CARE_PROVIDER_SITE_OTHER): Payer: BC Managed Care – PPO | Admitting: Family Medicine

## 2022-12-21 ENCOUNTER — Encounter: Payer: Self-pay | Admitting: Internal Medicine

## 2022-12-21 ENCOUNTER — Encounter: Payer: Self-pay | Admitting: Family Medicine

## 2022-12-21 DIAGNOSIS — G43909 Migraine, unspecified, not intractable, without status migrainosus: Secondary | ICD-10-CM

## 2022-12-21 MED ORDER — RIZATRIPTAN BENZOATE 10 MG PO TBDP: 10.0000 mg | ORAL_TABLET | ORAL | 2 refills | Status: DC | PRN

## 2022-12-21 NOTE — Patient Instructions (Signed)
  Treatment to STOP the headache at this time: - Start with Rizatriptan 10mg   dissolving tab - take 1 immediately at onset of moderate to severe migraine headache, if unresolved or return within 2 hours then repeat dose 1 tablet, that is max dose for 24 hours. (Note - this medication can cause a brief episode of flushing and chest pressure or pain very soon after taking it. That is NORMAL, and it is the medicine taking effect and dilating some blood vessels. It should pass, and resolve within seconds to minutes after - it may not happen at all)  If not effective or you are ready to try the newer medications, please message [place] back and arrange to pick up a sample of Nurtec / Ubrelvy.  We can order one of those medications next if insurance will cover, after you try this first one   Please schedule a Follow-up Appointment to: No follow-ups on file.  If you have any other questions or concerns, please feel free to call the office or send a message through MyChart. You may also schedule an earlier appointment if necessary.  Additionally, you may be receiving a survey about your experience at our office within a few days to 1 week by e-mail or mail. We value your feedback.  Saralyn Pilar, DO [facility], [place]

## 2022-12-21 NOTE — Telephone Encounter (Signed)
Patient seen at [id number] [facility] video visit today  Saralyn Pilar, DO [facility] 12/21/2022, 4:53 PM

## 2022-12-21 NOTE — Progress Notes (Addendum)
Subjective:    Patient ID: Danielle Morgan, female    DOB: December 23, 1995, 27 y.o.   MRN: [id number]  Danielle Morgan is a 27 y.o. female presenting on 12/21/2022 for Migraine  Patient presents for a same day appointment.  Virtual / Telehealth Encounter - Video Visit via MyChart The purpose of this virtual visit is to provide medical care while limiting exposure to the novel coronavirus (COVID19) for both patient and office staff.  Consent was obtained for remote visit:  Yes.   Answered questions that patient had about telehealth interaction:  Yes.   I discussed the limitations, risks, security and privacy concerns of performing an evaluation and management service by video/telephone. I also discussed with the patient that there may be a patient responsible charge related to this service. The patient expressed understanding and agreed to proceed.  Patient Location: Home Provider Location: [facility] (Office)  Participants in virtual visit: - Patient: Danielle Morgan - CMA: Darrol Angel, CMA - Provider: Dr Althea Charon  PCP Nicki Reaper, FNP   HPI  Episodic Migraine Overall migraines have reduced in the past 1-2 years. Reports headache moderate to severe behind one eye, now behind L eye, sensitivity to sounds, light, smells, and nausea associated. Can last hours to days if not stopped. Currently experiencing 2+ migraines this week, she usually 1-2 per month, headache days >4 per month but < 14 Tried Topamax previously (lip swelling), Fioricet Takes Flexeril AS NEEDED with good results abortive therapy Not on preventative now Triggers are Food, sleep, menstrual cycle Denies fever chills weakness dyspnea     11/15/2022    1:41 PM 12/13/2021    1:52 PM 05/30/2021   10:27 AM  Depression screen PHQ 2/9  Decreased Interest 1 0 0  Down, Depressed, Hopeless 1 0 0  PHQ - 2 Score 2 0 0  Altered sleeping 0 0 0  Tired, decreased energy 1 0 0  Change in appetite 0 0 0  Feeling  bad or failure about yourself  0 0 0  Trouble concentrating 0 0 0  Moving slowly or fidgety/restless 0 0 0  Suicidal thoughts 0 0 0  PHQ-9 Score 3 0 0  Difficult doing work/chores Not difficult at all Not difficult at all Not difficult at all    Social History   Tobacco Use   Smoking status: Never   Smokeless tobacco: Never  Vaping Use   Vaping Use: Never used  Substance Use Topics   Alcohol use: Yes    Comment: occasionally   Drug use: No    Review of Systems Per HPI unless specifically indicated above     Objective:    There were no vitals taken for this visit.  Wt Readings from Last 3 Encounters:  11/27/22 215 lb (97.5 kg)  11/21/22 212 lb (96.2 kg)  11/15/22 213 lb (96.6 kg)    Physical Exam  Note examination was completely remotely via video observation objective data only  Gen - well-appearing, no acute distress or apparent pain, comfortable HEENT - eyes appear clear without discharge or redness Heart/Lungs - cannot examine virtually - observed no evidence of coughing or labored breathing. Abd - cannot examine virtually  Skin - face visible today- no rash Neuro - awake, alert, oriented Psych - not anxious appearing   Results for orders placed or performed in visit on 11/15/22  TSH  Result Value Ref Range   TSH 5.97 (H) mIU/L  Lipid panel  Result Value Ref Range  Cholesterol 185 <200 mg/dL   HDL 54 > OR = 50 mg/dL   Triglycerides 82 <161 mg/dL   LDL Cholesterol (Calc) 113 (H) mg/dL (calc)   Total CHOL/HDL Ratio 3.4 <5.0 (calc)   Non-HDL Cholesterol (Calc) 131 (H) <130 mg/dL (calc)  Hemoglobin W9U  Result Value Ref Range   Hgb A1c MFr Bld 5.4 <5.7 % of total Hgb   Mean Plasma Glucose 108 mg/dL   eAG (mmol/L) 6.0 mmol/L  T4, free  Result Value Ref Range   Free T4 1.0 0.8 - 1.8 ng/dL      Assessment & Plan:   Problem List Items Addressed This Visit     Episodic migraine - Primary   Relevant Medications   rizatriptan (MAXALT-MLT) 10 MG  disintegrating tablet    Consistent with persistent migraine HA x 1-2 days w/ intermittent worsening, unilateral localized to L eye  Plan: 1. Start abortive therapy with Rizatriptan 10mg   dissolving tab - take 1 immediately at onset of moderate to severe migraine headache, if unresolved or return within 2 hours then repeat dose 1 tablet, that is max dose for 24 hours 2. Recommend taking Ibuprofen 600-800mg  q 8 hr PRN, and can try Tylenol 1000mg  TID alternatively 3. Avoid triggers including foods, caffeine. Important to rest.  Recommend future trial Nurtec ODT vs Ubrelvy after trial on Triptan. She prefers to avoid daily prophylaxis. That is no problem. She can pick up sample in future to try these others.  Meds ordered this encounter  Medications   rizatriptan (MAXALT-MLT) 10 MG disintegrating tablet    Sig: Take 1 tablet (10 mg total) by mouth as needed for migraine. May repeat in 2 hours if needed    Dispense:  10 tablet    Refill:  2      Follow up plan: Return if symptoms worsen or fail to improve.  Patient verbalizes understanding with the above medical recommendations including the limitation of remote medical advice.  Specific follow-up and call-back criteria were given for patient to follow-up or seek medical care more urgently if needed.  Total duration of direct patient care provided via video conference: 10 minutes   Saralyn Pilar, DO [facility] 12/21/2022, 4:02 PM

## 2023-01-05 ENCOUNTER — Encounter: Payer: Self-pay | Admitting: Internal Medicine

## 2023-01-07 MED ORDER — SEMAGLUTIDE-WEIGHT MANAGEMENT 0.5 MG/0.5ML ~~LOC~~ SOAJ: 0.5000 mg | SUBCUTANEOUS | 0 refills | Status: DC

## 2023-01-31 IMAGING — DX DG LUMBAR SPINE COMPLETE 4+V
5 series · 5 of 5 positions shown · non-contrast
Comparison: No prior.

CLINICAL DATA: Chronic low back pain.

EXAM:
LUMBAR SPINE - COMPLETE 4+ VIEW

[l-spine ap]
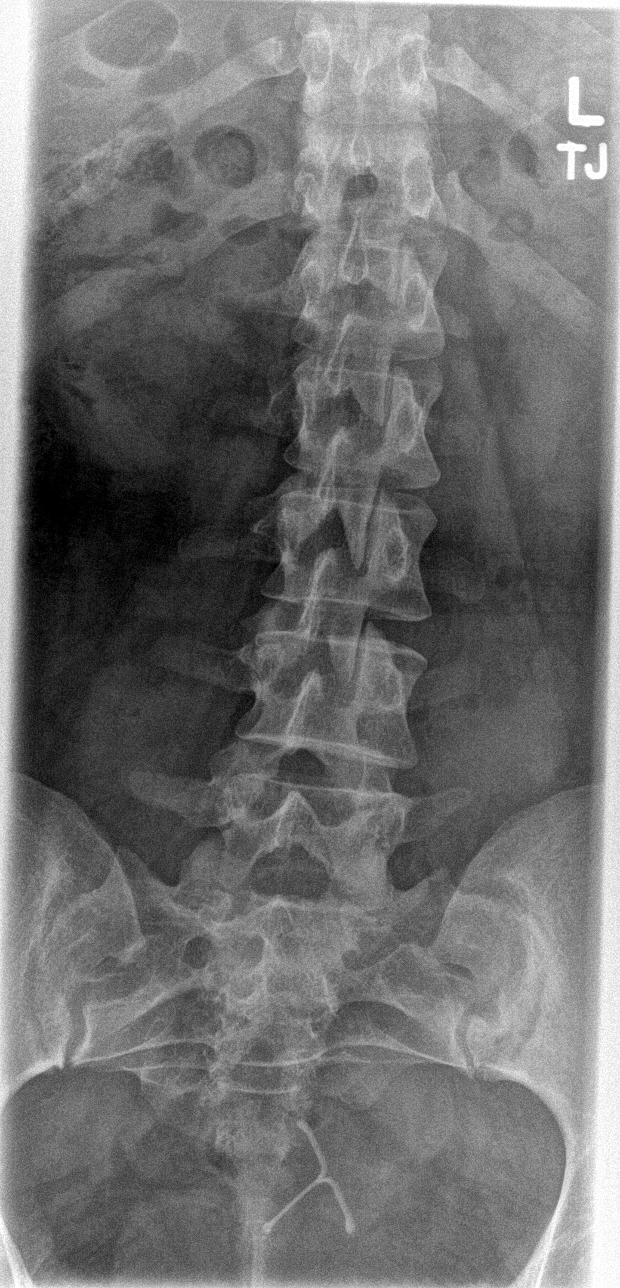

[l-spine obl (1 of 2)]
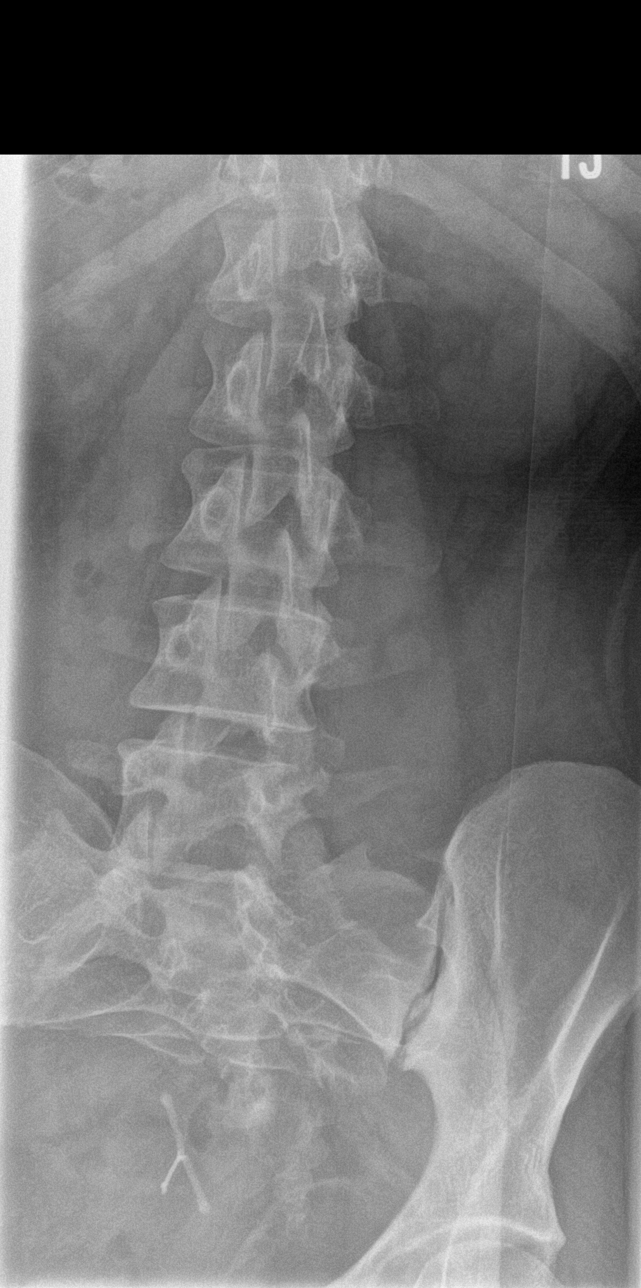

[l-spine obl (2 of 2)]
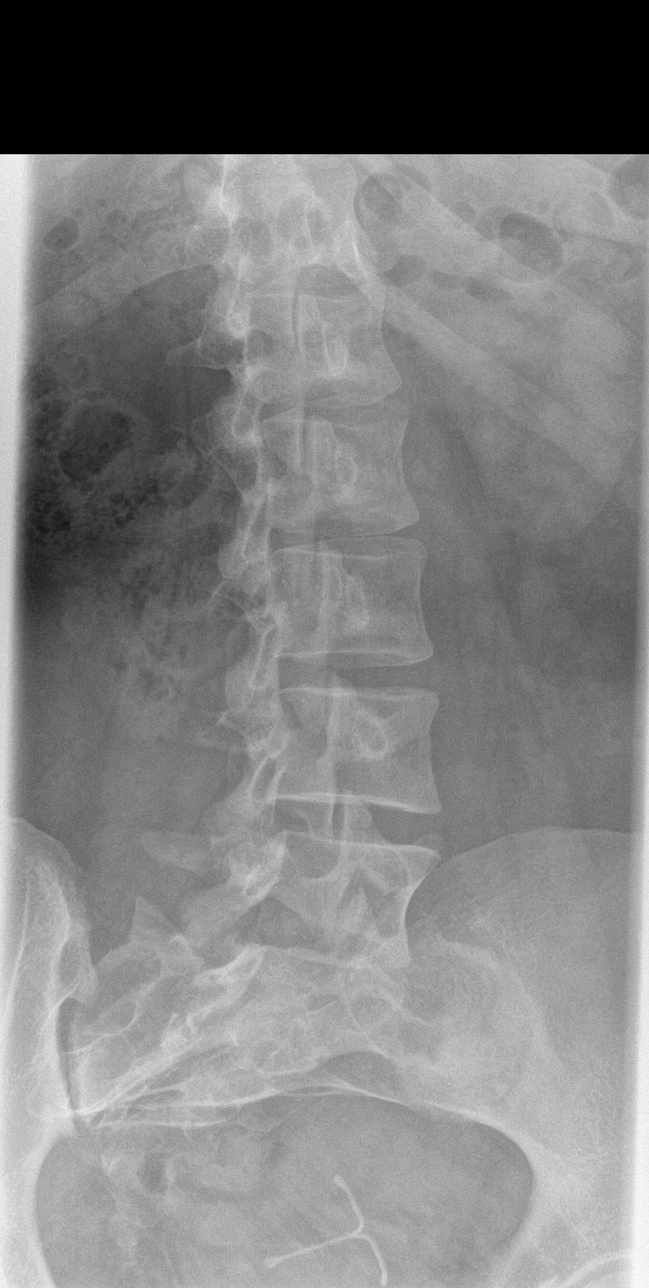

[l-spine lat]
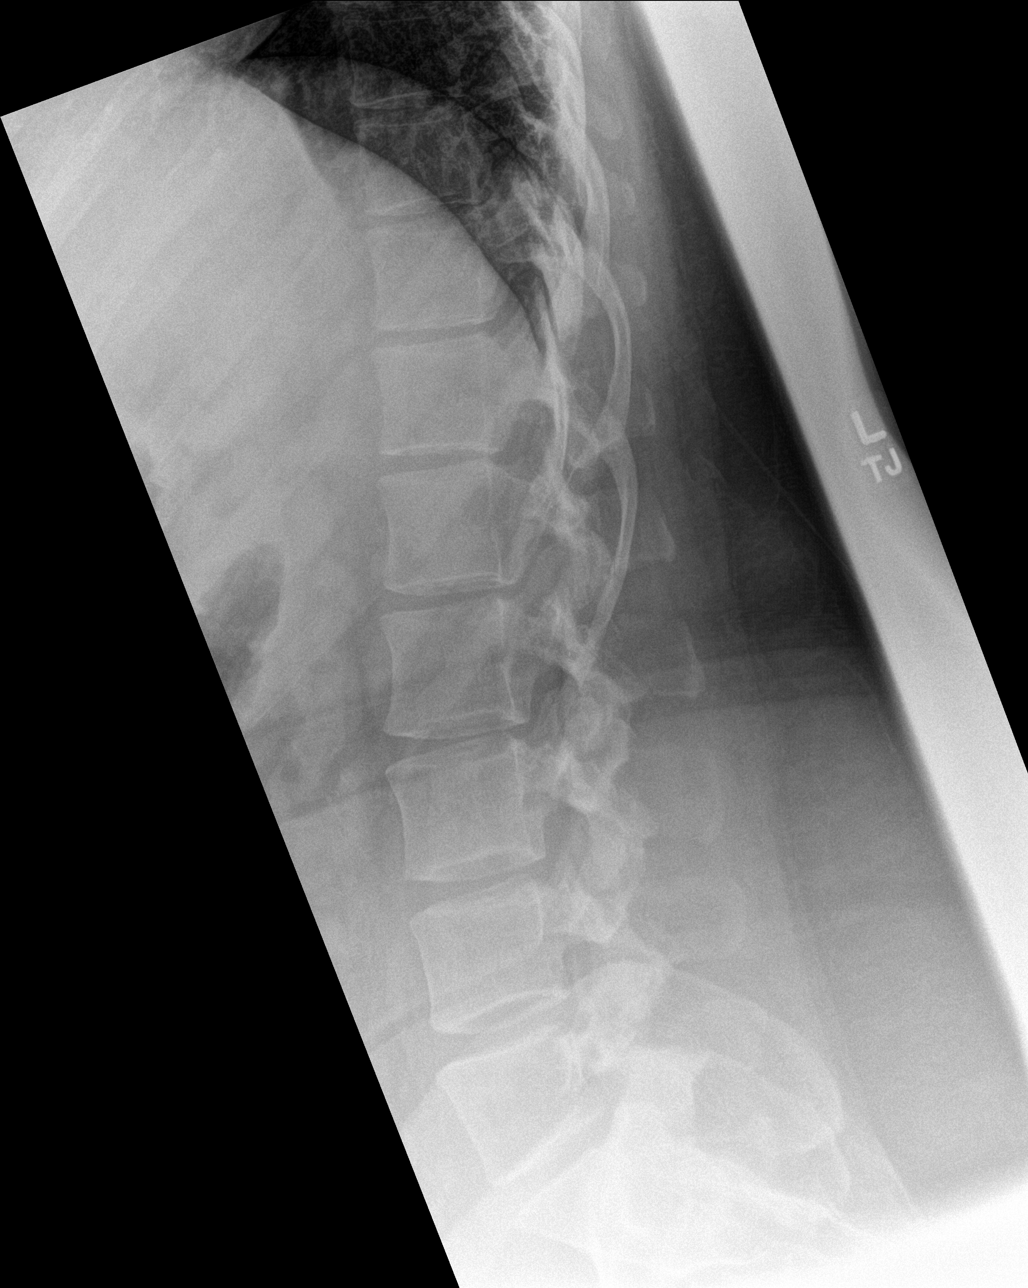

[l-spine l5/s1]
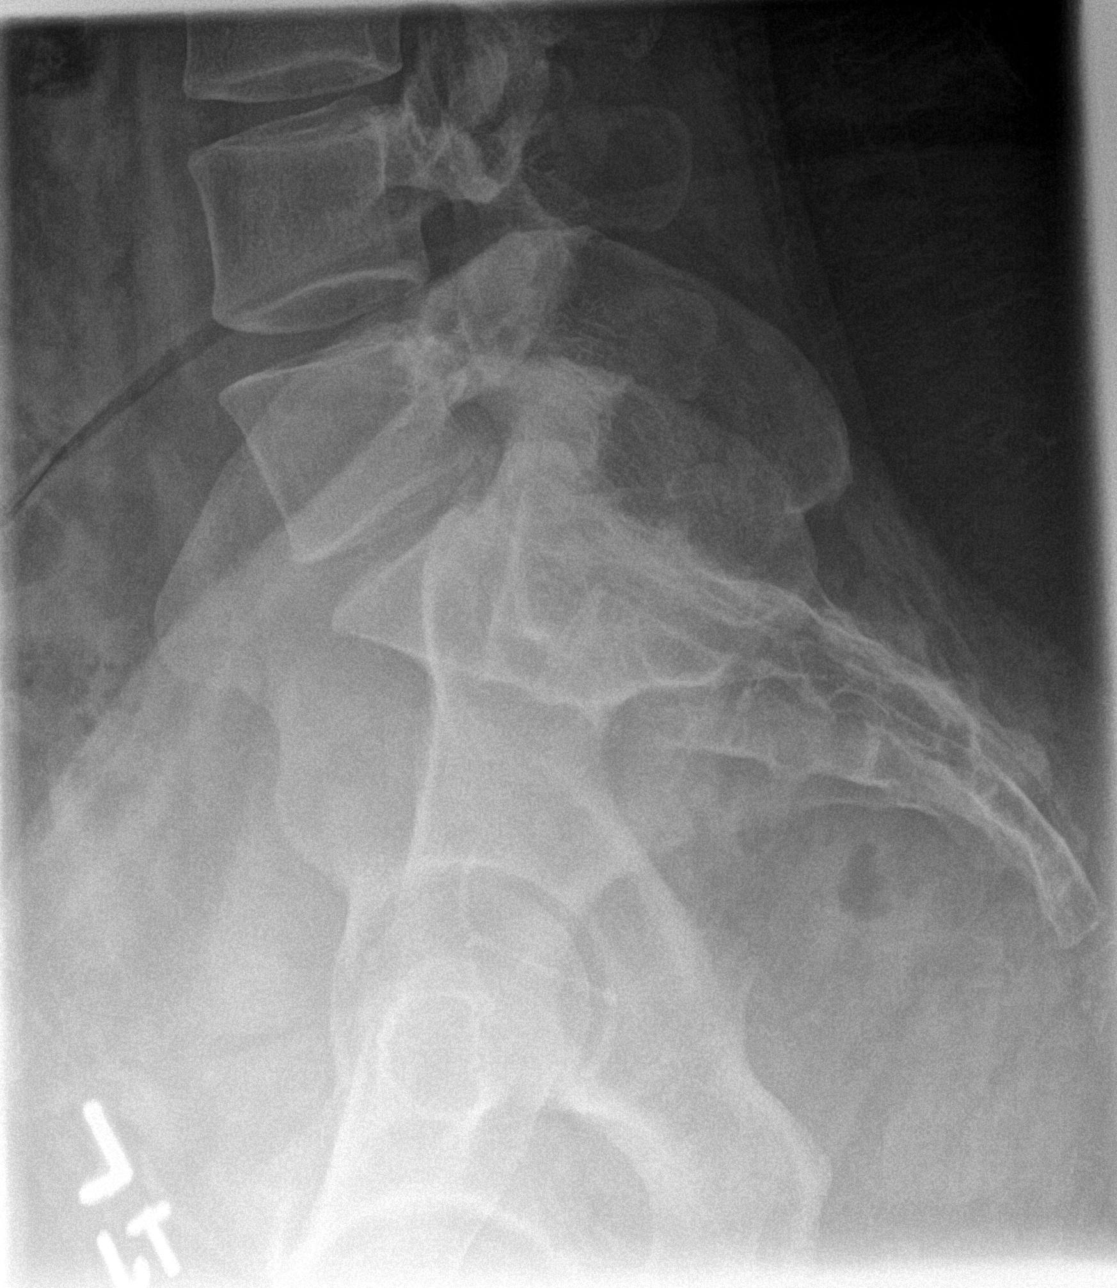

[5 of 5 positions shown; findings below may reference images not displayed]

FINDINGS: Paraspinal soft tissues unremarkable. IUD noted the pelvis. Mild
scoliosis concave right. No significant degenerative change. No
acute bony abnormality.
IMPRESSION: Mild scoliosis concave right. No acute bony abnormality.

## 2023-02-04 ENCOUNTER — Encounter: Payer: Self-pay | Admitting: Internal Medicine

## 2023-02-04 ENCOUNTER — Ambulatory Visit: Payer: Self-pay

## 2023-02-04 ENCOUNTER — Telehealth (INDEPENDENT_AMBULATORY_CARE_PROVIDER_SITE_OTHER): Payer: BC Managed Care – PPO | Admitting: Internal Medicine

## 2023-02-04 VITALS — Wt 194.0 lb

## 2023-02-04 DIAGNOSIS — R197 Diarrhea, unspecified: Secondary | ICD-10-CM

## 2023-02-04 DIAGNOSIS — R1111 Vomiting without nausea: Secondary | ICD-10-CM | POA: Diagnosis not present

## 2023-02-04 DIAGNOSIS — R142 Eructation: Secondary | ICD-10-CM | POA: Diagnosis not present

## 2023-02-04 DIAGNOSIS — K5903 Drug induced constipation: Secondary | ICD-10-CM

## 2023-02-04 DIAGNOSIS — T887XXA Unspecified adverse effect of drug or medicament, initial encounter: Secondary | ICD-10-CM

## 2023-02-04 DIAGNOSIS — R1084 Generalized abdominal pain: Secondary | ICD-10-CM | POA: Diagnosis not present

## 2023-02-04 MED ORDER — ONDANSETRON 4 MG PO TBDP: 4.0000 mg | ORAL_TABLET | Freq: Three times a day (TID) | ORAL | 0 refills | Status: DC | PRN

## 2023-02-04 NOTE — Patient Instructions (Signed)
Abdominal Pain, Adult Many things can cause belly (abdominal) pain. Most times, belly pain is not dangerous. Many cases of belly pain can be watched and treated at home. Sometimes, though, belly pain is serious. Your doctor will try to find the cause of your belly pain. Follow these instructions at home:  Medicines Take over-the-counter and prescription medicines only as told by your doctor. Do not take medicines that help you poop (laxatives) unless told by your doctor. General instructions Watch your belly pain for any changes. Drink enough fluid to keep your pee (urine) pale yellow. Keep all follow-up visits as told by your doctor. This is important. Contact a doctor if: Your belly pain changes or gets worse. You are not hungry, or you lose weight without trying. You are having trouble pooping (constipated) or have watery poop (diarrhea) for more than 2-3 days. You have pain when you pee or poop. Your belly pain wakes you up at night. Your pain gets worse with meals, after eating, or with certain foods. You are vomiting and cannot keep anything down. You have a fever. You have blood in your pee. Get help right away if: Your pain does not go away as soon as your doctor says it should. You cannot stop vomiting. Your pain is only in areas of your belly, such as the right side or the left lower part of the belly. You have bloody or black poop, or poop that looks like tar. You have very bad pain, cramping, or bloating in your belly. You have signs of not having enough fluid or water in your body (dehydration), such as: Dark pee, very little pee, or no pee. Cracked lips. Dry mouth. Sunken eyes. Sleepiness. Weakness. You have trouble breathing or chest pain. Summary Many cases of belly pain can be watched and treated at home. Watch your belly pain for any changes. Take over-the-counter and prescription medicines only as told by your doctor. Contact a doctor if your belly pain  changes or gets worse. Get help right away if you have very bad pain, cramping, or bloating in your belly. This information is not intended to replace advice given to you by your health care provider. Make sure you discuss any questions you have with your health care provider. Document Revised: 01/12/2019 Document Reviewed: 01/12/2019 Elsevier Patient Education  2023 Elsevier Inc.  

## 2023-02-04 NOTE — Progress Notes (Signed)
Subjective:    Patient ID: Danielle Morgan, female    DOB: Jun 30, 1996, 27 y.o.   MRN: [id number]  HPI  Virtual Visit via Video Note  I connected with [name] on 02/04/23 at  2:40 PM EDT by a video enabled telemedicine application and verified that I am speaking with the correct person using two identifiers.  Location: Patient: Home Provider: Office  Person's participating in this video call: Nicki Reaper, NP and [name]   I discussed the limitations of evaluation and management by telemedicine and the availability of in person appointments. The patient expressed understanding and agreed to proceed.  HPI  Patient presents to clinic today with complaint of abdominal pain, vomiting and diarrhea.  This started yesterday.  She describes the abdominal pain as a generalized cramping.  She reports the vomiting smells like sulfur and she is having sulfur burps.  She reports she has been constipated and took MiraLAX yesterday and is now having diarrhea.  She denies heartburn or blood in her stool.  She denies fever, chills or body aches.  She reports her son has had some diarrhea as well.  She is on Wegovy and recently increased her dose to 0.5 mg 3 weeks ago.  Review of Systems     Past Medical History:  Diagnosis Date   Frequent headaches    Thyroid disease     Current Outpatient Medications  Medication Sig Dispense Refill   gabapentin (NEURONTIN) 300 MG capsule Take 300mg  in the morning, 300mg  at lunch, and 600mg  at night. (Patient not taking: Reported on 12/21/2022) 120 capsule 1   levothyroxine (SYNTHROID) 100 MCG tablet Take 1 tablet (100 mcg total) by mouth daily. 90 tablet 0   meloxicam (MOBIC) 15 MG tablet Take 1 tablet (15 mg total) by mouth daily. Take with food. 30 tablet 2   methocarbamol (ROBAXIN) 500 MG tablet Take 1 tablet (500 mg total) by mouth every 8 (eight) hours as needed for muscle spasms. 60 tablet 0   phentermine 37.5 MG capsule Take 1 capsule (37.5 mg  total) by mouth every morning. 30 capsule 0   rizatriptan (MAXALT-MLT) 10 MG disintegrating tablet Take 1 tablet (10 mg total) by mouth as needed for migraine. May repeat in 2 hours if needed 10 tablet 2   Semaglutide-Weight Management 0.5 MG/0.5ML SOAJ Inject 0.5 mg into the skin once a week. 2 mL 0   No current facility-administered medications for this visit.    No Known Allergies  Family History  Problem Relation Age of Onset   Arthritis Mother    Bipolar disorder Father    ADD / ADHD Sister    ADD / ADHD Brother    Arthritis Maternal Grandfather    Hypertension Maternal Grandfather    Alcohol abuse Maternal Grandfather    Lung cancer Paternal Grandmother    Heart disease Maternal Uncle     Social History   Socioeconomic History   Marital status: Married    Spouse name: Not on file   Number of children: Not on file   Years of education: Not on file   Highest education level: Not on file  Occupational History   Not on file  Tobacco Use   Smoking status: Never   Smokeless tobacco: Never  Vaping Use   Vaping Use: Never used  Substance and Sexual Activity   Alcohol use: Yes    Comment: occasionally   Drug use: No   Sexual activity: Yes    Partners: Male  Birth control/protection: I.U.D.  Other Topics Concern   Not on file  Social History Narrative   Not on file   Social Determinants of Health   Financial Resource Strain: Not on file  Food Insecurity: Not on file  Transportation Needs: Not on file  Physical Activity: Not on file  Stress: Not on file  Social Connections: Not on file  Intimate Partner Violence: Not on file     Constitutional: Denies fever, malaise, fatigue, headache or abrupt weight changes.  Respiratory: Denies difficulty breathing, shortness of breath, cough or sputum production.   Cardiovascular: Denies chest pain, chest tightness, palpitations or swelling in the hands or feet.  Gastrointestinal: Patient reports abdominal pain,  vomiting and diarrhea.  Denies bloating, constipation, or blood in the stool.  GU: Denies urgency, frequency, pain with urination, burning sensation, blood in urine, odor or discharge. Neurological: Denies dizziness, difficulty with memory, difficulty with speech or problems with balance and coordination.    No other specific complaints in a complete review of systems (except as listed in HPI above).  Objective:   Physical Exam   Wt Readings from Last 3 Encounters:  11/27/22 215 lb (97.5 kg)  11/21/22 212 lb (96.2 kg)  11/15/22 213 lb (96.6 kg)    General: Appears her stated age, obese, in NAD. Pulmonary/Chest: Normal effort. No respiratory distress.  Abdomen: Patient points to her entire abdominal area as the site of her pain. Neurological: Alert and oriented.     BMET    Component Value Date/Time   NA 138 11/13/2022 0808   NA 137 10/23/2018 1044   K 3.8 11/13/2022 0808   CL 108 11/13/2022 0808   CO2 23 11/13/2022 0808   GLUCOSE 98 11/13/2022 0808   BUN 8 11/13/2022 0808   BUN 10 10/23/2018 1044   CREATININE 0.61 11/13/2022 0808   CREATININE 0.64 04/12/2022 1337   CALCIUM 9.0 11/13/2022 0808   GFRNONAA >60 11/13/2022 0808   GFRAA >60 01/13/2020 1826    Lipid Panel     Component Value Date/Time   CHOL 185 11/27/2022 0852   CHOL 167 10/23/2018 1044   TRIG 82 11/27/2022 0852   HDL 54 11/27/2022 0852   HDL 52 10/23/2018 1044   CHOLHDL 3.4 11/27/2022 0852   VLDL 34.0 12/30/2019 1510   LDLCALC 113 (H) 11/27/2022 0852    CBC    Component Value Date/Time   WBC 5.4 11/13/2022 0808   RBC 4.65 11/13/2022 0808   HGB 13.7 11/13/2022 0808   HGB 11.3 10/28/2019 0823   HCT 43.0 11/13/2022 0808   HCT 34.6 10/28/2019 0823   PLT 303 11/13/2022 0808   PLT 242 10/28/2019 0823   MCV 92.5 11/13/2022 0808   MCV 95 10/28/2019 0823   MCH 29.5 11/13/2022 0808   MCHC 31.9 11/13/2022 0808   RDW 13.3 11/13/2022 0808   RDW 11.6 (L) 10/28/2019 0823   LYMPHSABS 1.8 07/07/2019  1535   MONOABS 0.9 04/26/2015 1053   EOSABS 0.1 07/07/2019 1535   BASOSABS 0.0 07/07/2019 1535    Hgb A1C Lab Results  Component Value Date   HGBA1C 5.4 11/27/2022           Assessment & Plan:    Generalized Abdominal Pain, Belching, Vomiting, Constipation, Diarrhea:  Advised her these are all medication side effects of Wegovy It does not appear that she will tolerate the 0.5 mg dose Advised her to hold her injections until her symptoms resolve She can then restart at the 0.25 mg  dose weekly as she tolerated this well Rx for Zofran 4 mg as needed for belching and vomiting Try to avoid Imodium OTC as this can worsen constipation Okay to take MiraLAX as needed as needed for constipation  Follow Up Instructions:    I discussed the assessment and treatment plan with the patient. The patient was provided an opportunity to ask questions and all were answered. The patient agreed with the plan and demonstrated an understanding of the instructions.   The patient was advised to call back or seek an in-person evaluation if the symptoms worsen or if the condition fails to improve as anticipated.    RTC in 4 months, follow-up chronic conditions Nicki Reaper, NP

## 2023-02-04 NOTE — Telephone Encounter (Signed)
  Chief Complaint: GI problem  Symptoms: stomach pain, nausea, vomiting x 1, diarrhea, sulfur burps Frequency: ongoing since Mother's day, stopped on 14th but started back yesterday  Pertinent Negatives: NA Disposition: [] ED /[] Urgent Care (no appt availability in office) / [x] Appointment(In office/virtual)/ []  [facility]/ [] Home Care/ [] Refused Recommended Disposition /[] [facility]/ []  Follow-up with PCP Additional Notes: pt states sx started mother's day weekend and had above sx, got better and now have come back. Pt unsure if stomach virus or r/t increased dose of Wegovy but pt doesn't feel well today and was unable to take son to school. Scheduled OV today at 1440 with PCP then pt asked if can be changed to VV d/t afraid to drive r/t diarrhea. Advised pt I would send message to provider and they can let her know if change is ok or not. Pt verbalized understanding.   Summary: stomach discomfort   The patient shares that they have experienced stomach discomfort for roughly a week  The patient has experienced nausea and diarrhea  The patient would like to discuss their concerns with a member of staff when possible  Please contact when available         Reason for Disposition  [1] MILD-MODERATE pain AND [2] constant AND [3] present > 2 hours  Answer Assessment - Initial Assessment Questions 1. LOCATION: "Where does it hurt?"      Whole stomach  3. ONSET: "When did the pain begin?" (e.g., minutes, hours or days ago)      Mother's day  5. PATTERN "Does the pain come and go, or is it constant?"    - If it comes and goes: "How long does it last?" "Do you have pain now?"     (Note: Comes and goes means the pain is intermittent. It goes away completely between bouts.)    - If constant: "Is it getting better, staying the same, or getting worse?"      (Note: Constant means the pain never goes away completely; most serious pain is constant and gets worse.)      Comes  and goes  6. SEVERITY: "How bad is the pain?"  (e.g., Scale 1-10; mild, moderate, or severe)    - MILD (1-3): Doesn't interfere with normal activities, abdomen soft and not tender to touch.     - MODERATE (4-7): Interferes with normal activities or awakens from sleep, abdomen tender to touch.     - SEVERE (8-10): Excruciating pain, doubled over, unable to do any normal activities.       3/10 8. CAUSE: "What do you think is causing the stomach pain?"     Stomach virus or medication  9. RELIEVING/AGGRAVATING FACTORS: "What makes it better or worse?" (e.g., antacids, bending or twisting motion, bowel movement)     Not really  10. OTHER SYMPTOMS: "Do you have any other symptoms?" (e.g., back pain, diarrhea, fever, urination pain, vomiting)       Diarrhea x 10 times, vomiting x 1  Protocols used: Abdominal Pain - Female-A-AH

## 2023-02-07 ENCOUNTER — Encounter: Payer: Self-pay | Admitting: Internal Medicine

## 2023-02-13 MED ORDER — SEMAGLUTIDE-WEIGHT MANAGEMENT 0.25 MG/0.5ML ~~LOC~~ SOAJ: 0.2500 mg | SUBCUTANEOUS | 0 refills | Status: DC

## 2023-02-15 ENCOUNTER — Other Ambulatory Visit: Payer: Self-pay | Admitting: Internal Medicine

## 2023-02-18 NOTE — Telephone Encounter (Signed)
Requested medications are due for refill today.  See pharmacy note  Requested medications are on the active medications list.  yes  Last refill. 02/13/2023 - not filled  Future visit scheduled.   no  Notes to clinic.  Pharmacy comment: Script Clarification:NEEDS A PA.     Requested Prescriptions  Pending Prescriptions Disp Refills   WEGOVY 0.25 MG/0.5ML SOAJ [Pharmacy Med Name: WEGOVY 0.25 MG/0.5 ML PEN]  0    Sig: INJECT 0.25MG  INTO THE SKIN ONE TIME PER WEEK     Endocrinology:  Diabetes - GLP-1 Receptor Agonists - semaglutide Passed - 02/15/2023  7:21 PM      Passed - HBA1C in normal range and within 180 days    Hgb A1c MFr Bld  Date Value Ref Range Status  11/27/2022 5.4 <5.7 % of total Hgb Final    Comment:    For the purpose of screening for the presence of diabetes: . <5.7%       Consistent with the absence of diabetes 5.7-6.4%    Consistent with increased risk for diabetes             (prediabetes) > or =6.5%  Consistent with diabetes . This assay result is consistent with a decreased risk of diabetes. . Currently, no consensus exists regarding use of hemoglobin A1c for diagnosis of diabetes in children. . According to American Diabetes Association (ADA) guidelines, hemoglobin A1c <7.0% represents optimal control in non-pregnant diabetic patients. Different metrics may apply to specific patient populations.  Standards of Medical Care in Diabetes(ADA). .          Passed - Cr in normal range and within 360 days    Creat  Date Value Ref Range Status  04/12/2022 0.64 0.50 - 0.96 mg/dL Final   Creatinine, Ser  Date Value Ref Range Status  11/13/2022 0.61 0.44 - 1.00 mg/dL Final   Creatinine, Urine  Date Value Ref Range Status  01/13/2020 174.73 mg/dL Final         Passed - Valid encounter within last 6 months    Recent Outpatient Visits           2 weeks ago Generalized abdominal pain   [place] [facility] [place], Salvadore Oxford, NP   1  month ago Episodic migraine   [place] [facility] Smitty Cords, DO   2 months ago Encounter for general adult medical examination with abnormal findings   [place] [facility] [place], Salvadore Oxford, NP   3 months ago Near syncope   [place] [facility] [place], Salvadore Oxford, NP   10 months ago Pure hypercholesterolemia   [place] [facility] [place], Salvadore Oxford, [place]

## 2023-02-18 NOTE — Telephone Encounter (Signed)
I thought she was already approved for this

## 2023-02-19 ENCOUNTER — Other Ambulatory Visit: Payer: Self-pay | Admitting: Internal Medicine

## 2023-02-19 MED ORDER — SEMAGLUTIDE-WEIGHT MANAGEMENT 0.25 MG/0.5ML ~~LOC~~ SOAJ: 0.2500 mg | SUBCUTANEOUS | 0 refills | Status: DC

## 2023-02-19 NOTE — Telephone Encounter (Signed)
Requested medications are due for refill today.  No - see note from pharmacy  Requested medications are on the active medications list.  yes  Last refill. 02/19/2023  Future visit scheduled.   no  Notes to clinic.    Pharmacy comment: Alternative Requested:NEEDS PRIOR AUTH.      Requested Prescriptions  Pending Prescriptions Disp Refills   WEGOVY 0.25 MG/0.5ML SOAJ [Pharmacy Med Name: WEGOVY 0.25 MG/0.5 ML PEN]  0    Sig: INJECT 0.25MG  INTO THE SKIN ONE TIME PER WEEK     Endocrinology:  Diabetes - GLP-1 Receptor Agonists - semaglutide Passed - 02/19/2023  9:38 AM      Passed - HBA1C in normal range and within 180 days    Hgb A1c MFr Bld  Date Value Ref Range Status  11/27/2022 5.4 <5.7 % of total Hgb Final    Comment:    For the purpose of screening for the presence of diabetes: . <5.7%       Consistent with the absence of diabetes 5.7-6.4%    Consistent with increased risk for diabetes             (prediabetes) > or =6.5%  Consistent with diabetes . This assay result is consistent with a decreased risk of diabetes. . Currently, no consensus exists regarding use of hemoglobin A1c for diagnosis of diabetes in children. . According to American Diabetes Association (ADA) guidelines, hemoglobin A1c <7.0% represents optimal control in non-pregnant diabetic patients. Different metrics may apply to specific patient populations.  Standards of Medical Care in Diabetes(ADA). .          Passed - Cr in normal range and within 360 days    Creat  Date Value Ref Range Status  04/12/2022 0.64 0.50 - 0.96 mg/dL Final   Creatinine, Ser  Date Value Ref Range Status  11/13/2022 0.61 0.44 - 1.00 mg/dL Final   Creatinine, Urine  Date Value Ref Range Status  01/13/2020 174.73 mg/dL Final         Passed - Valid encounter within last 6 months    Recent Outpatient Visits           2 weeks ago Generalized abdominal pain   [place] [facility] [place], Salvadore Oxford,  NP   2 months ago Episodic migraine   [place] [facility] [facility] Smitty Cords, DO   2 months ago Encounter for general adult medical examination with abnormal findings   [place] [facility] [place], Salvadore Oxford, NP   3 months ago Near syncope   [place] [place], Salvadore Oxford, NP   10 months ago Pure hypercholesterolemia    [facility] [place], Salvadore Oxford, [place]

## 2023-02-19 NOTE — Addendum Note (Signed)
Addended by: Kavin Leech E on: 02/19/2023 11:15 AM   Modules accepted: Orders

## 2023-02-19 NOTE — Addendum Note (Signed)
Addended by: Kavin Leech E on: 02/19/2023 02:39 PM   Modules accepted: Orders

## 2023-03-03 NOTE — Progress Notes (Signed)
Noted patient has had follow up labs 

## 2023-03-11 ENCOUNTER — Other Ambulatory Visit: Payer: Self-pay | Admitting: Orthopedic Surgery

## 2023-03-11 ENCOUNTER — Encounter: Payer: Self-pay | Admitting: Internal Medicine

## 2023-03-11 DIAGNOSIS — M5136 Other intervertebral disc degeneration, lumbar region: Secondary | ICD-10-CM

## 2023-03-11 DIAGNOSIS — M5416 Radiculopathy, lumbar region: Secondary | ICD-10-CM

## 2023-03-12 ENCOUNTER — Encounter: Payer: Self-pay | Admitting: Internal Medicine

## 2023-03-12 DIAGNOSIS — M5136 Other intervertebral disc degeneration, lumbar region: Secondary | ICD-10-CM

## 2023-03-12 DIAGNOSIS — M5416 Radiculopathy, lumbar region: Secondary | ICD-10-CM

## 2023-03-12 MED ORDER — METHOCARBAMOL 500 MG PO TABS: 500.0000 mg | ORAL_TABLET | Freq: Three times a day (TID) | ORAL | 0 refills | Status: AC | PRN

## 2023-03-12 NOTE — Telephone Encounter (Signed)
See Mychart message. She is taking robaxin for migraines. Recommend she get from her PCP.

## 2023-03-12 NOTE — Telephone Encounter (Signed)
Saw Yarbrough back in January and he discussed surgery. She had repeat ESI with Mariah Milling at the end of January.   Got a refill request for robaxin.   Please call her and find out how she is doing- is pain better? Does she still need to take robaxin?   Will pend script for now until I have more information.

## 2023-03-12 NOTE — Telephone Encounter (Signed)
Sent mychart message to patient. Waiting on response.

## 2023-03-27 NOTE — Telephone Encounter (Signed)
PA submitted to Cover My Meds.    Thanks,   -Topanga Alvelo  

## 2023-03-29 ENCOUNTER — Encounter: Payer: Self-pay | Admitting: Internal Medicine

## 2023-04-09 ENCOUNTER — Other Ambulatory Visit: Payer: Self-pay | Admitting: Family Medicine

## 2023-04-09 DIAGNOSIS — G43909 Migraine, unspecified, not intractable, without status migrainosus: Secondary | ICD-10-CM

## 2023-04-10 NOTE — Telephone Encounter (Signed)
Requested Prescriptions  Pending Prescriptions Disp Refills   rizatriptan (MAXALT-MLT) 10 MG disintegrating tablet [Pharmacy Med Name: RIZATRIPTAN 10 MG ODT] 10 tablet 2    Sig: TAKE 1 TABLET BY MOUTH AS NEEDED FOR MIGRAINE. MAY REPEAT IN 2 HOURS IF NEEDED     Neurology:  Migraine Therapy - Triptan Passed - 04/09/2023  2:33 AM      Passed - Last BP in normal range    BP Readings from Last 1 Encounters:  11/27/22 126/78         Passed - Valid encounter within last 12 months    Recent Outpatient Visits           2 months ago Generalized abdominal pain   [place] [facility] [place], Salvadore Oxford, NP   3 months ago Episodic migraine   [place] [facility] Smitty Cords, DO   4 months ago Encounter for general adult medical examination with abnormal findings   [place] [facility] [place], Salvadore Oxford, NP   4 months ago Near syncope   [place] [facility] [place], Salvadore Oxford, NP   12 months ago Pure hypercholesterolemia   [facility] [place], Salvadore Oxford, [place]

## 2023-04-24 ENCOUNTER — Encounter: Payer: Self-pay | Admitting: Internal Medicine

## 2023-04-26 MED ORDER — SCOPOLAMINE 1 MG/3DAYS TD PT72: 1.0000 | MEDICATED_PATCH | TRANSDERMAL | 0 refills | Status: DC

## 2023-04-27 ENCOUNTER — Other Ambulatory Visit: Payer: Self-pay | Admitting: Internal Medicine

## 2023-04-29 NOTE — Telephone Encounter (Signed)
Requested medications are due for refill today.  yes  Requested medications are on the active medications list.  yes  Last refill. 11/29/2022 #90 0 rf  Future visit scheduled.   no  Notes to clinic.  Abnormal labs.    Requested Prescriptions  Pending Prescriptions Disp Refills   levothyroxine (SYNTHROID) 100 MCG tablet [Pharmacy Med Name: LEVOTHYROXINE 100 MCG TABLET] 90 tablet 0    Sig: TAKE 1 TABLET BY MOUTH EVERY DAY     Endocrinology:  Hypothyroid Agents Failed - 04/27/2023 10:51 AM      Failed - TSH in normal range and within 360 days    TSH  Date Value Ref Range Status  11/27/2022 5.97 (H) mIU/L Final    Comment:              Reference Range .           > or = 20 Years  0.40-4.50 .                Pregnancy Ranges           First trimester    0.26-2.66           Second trimester   0.55-2.73           Third trimester    0.43-2.91          Passed - Valid encounter within last 12 months    Recent Outpatient Visits           2 months ago Generalized abdominal pain   [facility] [place], Salvadore Oxford, NP   4 months ago Episodic migraine   [place] Smitty Cords, DO   5 months ago Encounter for general adult medical examination with abnormal findings   [place] [facility] [place], Salvadore Oxford, NP   5 months ago Near syncope   [place] [facility] [place], Salvadore Oxford, NP   1 year ago Pure hypercholesterolemia    [facility] [place], Salvadore Oxford, [place]

## 2023-06-11 ENCOUNTER — Encounter: Payer: Self-pay | Admitting: Internal Medicine

## 2023-06-17 ENCOUNTER — Ambulatory Visit (INDEPENDENT_AMBULATORY_CARE_PROVIDER_SITE_OTHER): Payer: BC Managed Care – PPO | Admitting: Internal Medicine

## 2023-06-17 ENCOUNTER — Encounter: Payer: Self-pay | Admitting: Internal Medicine

## 2023-06-17 ENCOUNTER — Other Ambulatory Visit (HOSPITAL_COMMUNITY)
Admission: RE | Admit: 2023-06-17 | Discharge: 2023-06-17 | Disposition: A | Discharge status: Home / Self Care | Payer: BC Managed Care – PPO | Source: Ambulatory Visit | Attending: Internal Medicine | Admitting: Internal Medicine

## 2023-06-17 VITALS — BP 116/74 | HR 78 | Wt 204.0 lb

## 2023-06-17 DIAGNOSIS — L989 Disorder of the skin and subcutaneous tissue, unspecified: Secondary | ICD-10-CM | POA: Insufficient documentation

## 2023-06-17 DIAGNOSIS — E7800 Pure hypercholesterolemia, unspecified: Secondary | ICD-10-CM

## 2023-06-17 DIAGNOSIS — M5442 Lumbago with sciatica, left side: Secondary | ICD-10-CM

## 2023-06-17 DIAGNOSIS — D2362 Other benign neoplasm of skin of left upper limb, including shoulder: Secondary | ICD-10-CM | POA: Diagnosis not present

## 2023-06-17 DIAGNOSIS — E039 Hypothyroidism, unspecified: Secondary | ICD-10-CM | POA: Diagnosis not present

## 2023-06-17 DIAGNOSIS — E6609 Other obesity due to excess calories: Secondary | ICD-10-CM

## 2023-06-17 DIAGNOSIS — R7309 Other abnormal glucose: Secondary | ICD-10-CM

## 2023-06-17 DIAGNOSIS — G8929 Other chronic pain: Secondary | ICD-10-CM

## 2023-06-17 DIAGNOSIS — E66811 Obesity, class 1: Secondary | ICD-10-CM

## 2023-06-17 DIAGNOSIS — Z6833 Body mass index (BMI) 33.0-33.9, adult: Secondary | ICD-10-CM

## 2023-06-17 DIAGNOSIS — R739 Hyperglycemia, unspecified: Secondary | ICD-10-CM

## 2023-06-17 DIAGNOSIS — G43909 Migraine, unspecified, not intractable, without status migrainosus: Secondary | ICD-10-CM

## 2023-06-17 NOTE — Assessment & Plan Note (Signed)
Not currently on hormonal therapy but this may be something to consider in the future as her migraines seem to be triggered by hormones/menses Continue Maxalt as needed

## 2023-06-17 NOTE — Patient Instructions (Signed)

## 2023-06-17 NOTE — Assessment & Plan Note (Signed)
TSH and free T4 today We will adjust levothyroxine if needed based on labs 

## 2023-06-17 NOTE — Progress Notes (Signed)
Subjective:    Patient ID: Danielle Morgan, female    DOB: September 19, 1995, 27 y.o.   MRN: [id number]  HPI  Patient presents to clinic today for 45-month follow-up of chronic conditions.  Hypothyroidism: She has been feeling fatigued lately. She has been accidentally missing doses of her levothyroxine but reports that she actually feels better when she does not take it.  She does not follow with endocrinology.  Migraines: These occur about 1 x month.  Triggered by her menstrual cycle.  She takes maxalt as needed with some relief of symptoms but reports that she has to take multiple pills that week to keep the headache at bay.  She does not follow with neurology.  HLD: Her last LDL was 113, triglycerides 82, 11/2022.  She is not taking any cholesterol-lowering medication at this time.  She tries to consume a low-fat diet.  Sciatica: Left side. S/p injections. Managed with meloxicam and methocarbamol as needed with some relief of symptoms.  MRI from 06/2022 reviewed.  She follows with neurosurgery.  She also reports a skin lesion on her left arm.  She noticed this a few years ago. She reports it has gotten bigger in size but has not changed color. She has a family history of melanoma.  Review of Systems     Past Medical History:  Diagnosis Date   Frequent headaches    Thyroid disease     Current Outpatient Medications  Medication Sig Dispense Refill   gabapentin (NEURONTIN) 300 MG capsule Take 300mg  in the morning, 300mg  at lunch, and 600mg  at night. (Patient not taking: Reported on 12/21/2022) 120 capsule 1   levothyroxine (SYNTHROID) 100 MCG tablet TAKE 1 TABLET BY MOUTH EVERY DAY 90 tablet 0   meloxicam (MOBIC) 15 MG tablet Take 1 tablet (15 mg total) by mouth daily. Take with food. 30 tablet 2   methocarbamol (ROBAXIN) 500 MG tablet Take 1 tablet (500 mg total) by mouth every 8 (eight) hours as needed for muscle spasms. 60 tablet 0   ondansetron (ZOFRAN-ODT) 4 MG disintegrating tablet Take  1 tablet (4 mg total) by mouth every 8 (eight) hours as needed for nausea or vomiting. 30 tablet 0   phentermine 37.5 MG capsule Take 1 capsule (37.5 mg total) by mouth every morning. 30 capsule 0   rizatriptan (MAXALT-MLT) 10 MG disintegrating tablet TAKE 1 TABLET BY MOUTH AS NEEDED FOR MIGRAINE. MAY REPEAT IN 2 HOURS IF NEEDED 10 tablet 2   scopolamine (TRANSDERM-SCOP) 1 MG/3DAYS Place 1 patch (1.5 mg total) onto the skin every 3 (three) days. For motion sickness. Start 2 hr before onset symptoms, up to 12 hr before 4 patch 0   Semaglutide-Weight Management 0.25 MG/0.5ML SOAJ Inject 0.25 mg into the skin once a week. 6 mL 0   No current facility-administered medications for this visit.    No Known Allergies  Family History  Problem Relation Age of Onset   Arthritis Mother    Bipolar disorder Father    ADD / ADHD Sister    ADD / ADHD Brother    Arthritis Maternal Grandfather    Hypertension Maternal Grandfather    Alcohol abuse Maternal Grandfather    Lung cancer Paternal Grandmother    Heart disease Maternal Uncle     Social History   Socioeconomic History   Marital status: Married    Spouse name: Not on file   Number of children: Not on file   Years of education: Not on file   Highest  education level: Not on file  Occupational History   Not on file  Tobacco Use   Smoking status: Never   Smokeless tobacco: Never  Vaping Use   Vaping status: Never Used  Substance and Sexual Activity   Alcohol use: Yes    Comment: occasionally   Drug use: No   Sexual activity: Yes    Partners: Male    Birth control/protection: I.U.D.  Other Topics Concern   Not on file  Social History Narrative   Not on file   Social Determinants of Health   Financial Resource Strain: Not on file  Food Insecurity: Not on file  Transportation Needs: Not on file  Physical Activity: Not on file  Stress: Not on file  Social Connections: Not on file  Intimate Partner Violence: Not on file      Constitutional: Patient reports intermittent headaches, fatigue.  Denies fever, malaise, or abrupt weight changes.  HEENT: Denies eye pain, eye redness, ear pain, ringing in the ears, wax buildup, runny nose, nasal congestion, bloody nose, or sore throat. Respiratory: Denies difficulty breathing, shortness of breath, cough or sputum production.   Cardiovascular: Denies chest pain, chest tightness, palpitations or swelling in the hands or feet.  Gastrointestinal: Denies abdominal pain, bloating, constipation, diarrhea or blood in the stool.  GU: Denies urgency, frequency, pain with urination, burning sensation, blood in urine, odor or discharge. Musculoskeletal: Patient reports intermittent low back pain.  Denies decrease in range of motion, difficulty with gait, or joint swelling.  Skin: Patient reports skin lesion of left upper arm.  Denies redness, rashes, or ulcercations.  Neurological: Denies dizziness, difficulty with memory, difficulty with speech or problems with balance and coordination.  Psych: Denies anxiety, depression, SI/HI.  No other specific complaints in a complete review of systems (except as listed in HPI above).  Objective:   Physical Exam  BP 116/74 (BP Location: Left Arm, Patient Position: Sitting, Cuff Size: Normal)   Pulse 78   Wt 204 lb (92.5 kg)   SpO2 95%   BMI 33.95 kg/m   Wt Readings from Last 3 Encounters:  02/04/23 194 lb (88 kg)  11/27/22 215 lb (97.5 kg)  11/21/22 212 lb (96.2 kg)    General: Appears her stated age, obese, in NAD. Skin: 4 mm papule noted of left medial upper arm. HEENT: Head: normal shape and size; Eyes: sclera white, no icterus, conjunctiva pink, PERRLA and EOMs intact;  Neck:  Neck supple, trachea midline. No masses, lumps or thyromegaly present.  Cardiovascular: Normal rate and rhythm. S1,S2 noted.  No murmur, rubs or gallops noted.  Pulmonary/Chest: Normal effort and positive vesicular breath sounds. No respiratory  distress. No wheezes, rales or ronchi noted.  Musculoskeletal: Normal flexion, extension, rotation and lateral bending of the spine.  Pain with palpation over the lumbar spine.  She has no difficulty getting from a sitting to a standing position.  No difficulty with gait.  Neurological: Alert and oriented.  Coordination normal.    BMET    Component Value Date/Time   NA 138 11/13/2022 0808   NA 137 10/23/2018 1044   K 3.8 11/13/2022 0808   CL 108 11/13/2022 0808   CO2 23 11/13/2022 0808   GLUCOSE 98 11/13/2022 0808   BUN 8 11/13/2022 0808   BUN 10 10/23/2018 1044   CREATININE 0.61 11/13/2022 0808   CREATININE 0.64 04/12/2022 1337   CALCIUM 9.0 11/13/2022 0808   GFRNONAA >60 11/13/2022 0808   GFRAA >60 01/13/2020 1826  Lipid Panel     Component Value Date/Time   CHOL 185 11/27/2022 0852   CHOL 167 10/23/2018 1044   TRIG 82 11/27/2022 0852   HDL 54 11/27/2022 0852   HDL 52 10/23/2018 1044   CHOLHDL 3.4 11/27/2022 0852   VLDL 34.0 12/30/2019 1510   LDLCALC 113 (H) 11/27/2022 0852    CBC    Component Value Date/Time   WBC 5.4 11/13/2022 0808   RBC 4.65 11/13/2022 0808   HGB 13.7 11/13/2022 0808   HGB 11.3 10/28/2019 0823   HCT 43.0 11/13/2022 0808   HCT 34.6 10/28/2019 0823   PLT 303 11/13/2022 0808   PLT 242 10/28/2019 0823   MCV 92.5 11/13/2022 0808   MCV 95 10/28/2019 0823   MCH 29.5 11/13/2022 0808   MCHC 31.9 11/13/2022 0808   RDW 13.3 11/13/2022 0808   RDW 11.6 (L) 10/28/2019 0823   LYMPHSABS 1.8 07/07/2019 1535   MONOABS 0.9 04/26/2015 1053   EOSABS 0.1 07/07/2019 1535   BASOSABS 0.0 07/07/2019 1535    Hgb A1C Lab Results  Component Value Date   HGBA1C 5.4 11/27/2022           Assessment & Plan:   Skin lesion of left arm:  Shave biopsy performed today, aftercare instructions provided Will send for pathology, advised her results typically take around 1 week Can refer to dermatology if needed based off pathology  Procedure  note:  Informed consent obtained verbally after discussing risk including scarring, bleeding, pain or infection Area cleansed with Betadine x 3 Area numbed with 1 mL 2% lidocaine with epi Area excised with dermablade Area cauterized with silver nitrate x 1 Covered with triple antibiotic ointment and Band-Aid Patient tolerated well, no complications   RTC in 6 months, for your annual exam Nicki Reaper, NP

## 2023-06-17 NOTE — Assessment & Plan Note (Signed)
Currently on Wegovy 0.25 She did not tolerate increase to 0.5 mg in the past due to severe GI side effects She would like to go back up to the 0.5 for the added benefit of weight loss but is concerned about the previous GI side effects.  She will consider this and get back with me Encourage high-protein, low-carb diet and exercise for weight loss

## 2023-06-17 NOTE — Assessment & Plan Note (Signed)
C-Met and lipid profile today Encouraged her to consume a low-fat diet 

## 2023-06-17 NOTE — Addendum Note (Signed)
Addended by: Kavin Leech E on: 06/17/2023 04:28 PM   Modules accepted: Orders

## 2023-06-17 NOTE — Assessment & Plan Note (Addendum)
Encouraged regular stretching and core strengthening Continue meloxicam and methocarbamol as needed She will continue to follow-up with neurosurgery

## 2023-06-18 LAB — HEMOGLOBIN A1C
Hgb A1c MFr Bld: 5.3 %{Hb} (ref <5.7)
Mean Plasma Glucose: 105 mg/dL
eAG (mmol/L): 5.8 mmol/L

## 2023-06-18 LAB — COMPLETE METABOLIC PANEL WITH GFR
AG Ratio: 1.4 (calc) (ref 1.0–2.5)
ALT: 15 U/L (ref 6–29)
AST: 18 U/L (ref 10–30)
Albumin: 4.3 g/dL (ref 3.6–5.1)
Alkaline phosphatase (APISO): 67 U/L (ref 31–125)
BUN: 11 mg/dL (ref 7–25)
CO2: 24 mmol/L (ref 20–32)
Calcium: 9.1 mg/dL (ref 8.6–10.2)
Chloride: 104 mmol/L (ref 98–110)
Creat: 0.56 mg/dL (ref 0.50–0.96)
Globulin: 3.1 g/dL (ref 1.9–3.7)
Glucose, Bld: 70 mg/dL (ref 65–99)
Potassium: 4.4 mmol/L (ref 3.5–5.3)
Sodium: 136 mmol/L (ref 135–146)
Total Bilirubin: 0.3 mg/dL (ref 0.2–1.2)
Total Protein: 7.4 g/dL (ref 6.1–8.1)
eGFR: 128 mL/min/{1.73_m2} (ref >=60)

## 2023-06-18 LAB — LIPID PANEL
Cholesterol: 178 mg/dL (ref <200)
HDL: 52 mg/dL (ref >=50)
LDL Cholesterol (Calc): 103 mg/dL — ABNORMAL HIGH
Non-HDL Cholesterol (Calc): 126 mg/dL (ref <130)
Total CHOL/HDL Ratio: 3.4 (calc) (ref <5.0)
Triglycerides: 136 mg/dL (ref <150)

## 2023-06-18 LAB — TSH: TSH: 4.99 m[IU]/L — ABNORMAL HIGH

## 2023-06-18 LAB — T4, FREE: Free T4: 1.1 ng/dL (ref 0.8–1.8)

## 2023-06-19 LAB — SURGICAL PATHOLOGY

## 2023-07-20 ENCOUNTER — Encounter: Payer: Self-pay | Admitting: Internal Medicine

## 2023-07-22 MED ORDER — SEMAGLUTIDE-WEIGHT MANAGEMENT 0.5 MG/0.5ML ~~LOC~~ SOAJ: 0.5000 mg | SUBCUTANEOUS | 0 refills | Status: DC

## 2023-08-02 ENCOUNTER — Other Ambulatory Visit: Payer: Self-pay | Admitting: Internal Medicine

## 2023-08-02 NOTE — Telephone Encounter (Signed)
Requested Prescriptions  Pending Prescriptions Disp Refills   levothyroxine (SYNTHROID) 100 MCG tablet [Pharmacy Med Name: LEVOTHYROXINE 100 MCG TABLET] 90 tablet 0    Sig: TAKE 1 TABLET BY MOUTH EVERY DAY     Endocrinology:  Hypothyroid Agents Failed - 08/02/2023  1:29 AM      Failed - TSH in normal range and within 360 days    TSH  Date Value Ref Range Status  06/17/2023 4.99 (H) mIU/L Final    Comment:              Reference Range .           > or = 20 Years  0.40-4.50 .                Pregnancy Ranges           First trimester    0.26-2.66           Second trimester   0.55-2.73           Third trimester    0.43-2.91          Passed - Valid encounter within last 12 months    Recent Outpatient Visits           1 month ago Pure hypercholesterolemia   [place] [facility] [place], Salvadore Oxford, NP   5 months ago Generalized abdominal pain   [place] [facility] [place], Salvadore Oxford, NP   7 months ago Episodic migraine   [place] [facility] Smitty Cords, DO   8 months ago Encounter for general adult medical examination with abnormal findings   [place] [facility] [place], Salvadore Oxford, NP   8 months ago Near syncope   [place] [place], Salvadore Oxford, NP       Future Appointments             In 3 months Ba[place], Salvadore Oxford, NP  [facility]

## 2023-08-27 ENCOUNTER — Encounter: Payer: Self-pay | Admitting: Internal Medicine

## 2023-09-23 ENCOUNTER — Other Ambulatory Visit: Payer: Self-pay | Admitting: Internal Medicine

## 2023-09-23 DIAGNOSIS — G43909 Migraine, unspecified, not intractable, without status migrainosus: Secondary | ICD-10-CM

## 2023-09-24 NOTE — Telephone Encounter (Signed)
 Requested Prescriptions  Pending Prescriptions Disp Refills   rizatriptan  (MAXALT -MLT) 10 MG disintegrating tablet [Pharmacy Med Name: RIZATRIPTAN  10 MG ODT] 10 tablet 2    Sig: TAKE 1 TABLET BY MOUTH AS NEEDED FOR MIGRAINE. MAY REPEAT IN 2 HOURS IF NEEDED     Neurology:  Migraine Therapy - Triptan Passed - 09/24/2023  7:22 PM      Passed - Last BP in normal range    BP Readings from Last 1 Encounters:  06/17/23 116/74         Passed - Valid encounter within last 12 months    Recent Outpatient Visits           3 months ago Pure hypercholesterolemia   [place] [facility] [place], Angeline ORN, NP   7 months ago Generalized abdominal pain   [place] [facility] [place], Angeline ORN, NP   9 months ago Episodic migraine   [place] [facility] [place], Marsa PARAS, DO   10 months ago Encounter for general adult medical examination with abnormal findings   [place] [facility] [place], Angeline ORN, NP   10 months ago Near syncope   [facility] [place], Angeline ORN, NP       Future Appointments             In 2 months Baity, Angeline ORN, NP [place] [facility]

## 2023-11-29 ENCOUNTER — Telehealth: Payer: Self-pay

## 2023-11-29 ENCOUNTER — Ambulatory Visit (INDEPENDENT_AMBULATORY_CARE_PROVIDER_SITE_OTHER): Payer: BC Managed Care – PPO | Admitting: Internal Medicine

## 2023-11-29 ENCOUNTER — Encounter: Payer: Self-pay | Admitting: Internal Medicine

## 2023-11-29 VITALS — BP 112/68 | Ht 65.0 in | Wt 184.4 lb

## 2023-11-29 DIAGNOSIS — Z0001 Encounter for general adult medical examination with abnormal findings: Secondary | ICD-10-CM | POA: Diagnosis not present

## 2023-11-29 DIAGNOSIS — E7800 Pure hypercholesterolemia, unspecified: Secondary | ICD-10-CM

## 2023-11-29 DIAGNOSIS — R739 Hyperglycemia, unspecified: Secondary | ICD-10-CM | POA: Diagnosis not present

## 2023-11-29 DIAGNOSIS — E6609 Other obesity due to excess calories: Secondary | ICD-10-CM

## 2023-11-29 DIAGNOSIS — Z6830 Body mass index (BMI) 30.0-30.9, adult: Secondary | ICD-10-CM

## 2023-11-29 DIAGNOSIS — E66811 Obesity, class 1: Secondary | ICD-10-CM

## 2023-11-29 DIAGNOSIS — L679 Hair color and hair shaft abnormality, unspecified: Secondary | ICD-10-CM | POA: Diagnosis not present

## 2023-11-29 DIAGNOSIS — G43909 Migraine, unspecified, not intractable, without status migrainosus: Secondary | ICD-10-CM

## 2023-11-29 DIAGNOSIS — E039 Hypothyroidism, unspecified: Secondary | ICD-10-CM

## 2023-11-29 MED ORDER — SEMAGLUTIDE-WEIGHT MANAGEMENT 0.5 MG/0.5ML ~~LOC~~ SOAJ: 0.5000 mg | SUBCUTANEOUS | 0 refills | Status: DC

## 2023-11-29 MED ORDER — NURTEC 75 MG PO TBDP
75.0000 mg | ORAL_TABLET | Freq: Every day | ORAL | 5 refills | Status: DC | PRN
Start: 2023-11-29 — End: 2024-01-23

## 2023-11-29 NOTE — Telephone Encounter (Signed)
 Danielle Morgan (Key: [id number]) Rx #: [id number] Need Help? Call us at [phone] Status New (Not sent to plan) Drug Nurtec 75MG  dispersible tablets ePA cloud logo Form [employer] [place] [employer] Electronic Request Form Original Claim Info 75

## 2023-11-29 NOTE — Patient Instructions (Signed)

## 2023-11-29 NOTE — Progress Notes (Signed)
 Subjective:    Patient ID: Danielle Morgan, female    DOB: 09-15-1996, 28 y.o.   MRN: [id number]  HPI  Patient presents to clinic today for her annual exam.  Flu: 06/2019 Tetanus: 10/2019 COVID: Never Pap smear: 06/2021 Dentist: biannually  Diet: She does eat meat. She consumes fruits and veggies. She does eat some fried foods. She drinks mostly water. Exercise: 3 x week, walking  Review of Systems  Past Medical History:  Diagnosis Date   Frequent headaches    Thyroid disease     Current Outpatient Medications  Medication Sig Dispense Refill   levothyroxine (SYNTHROID) 100 MCG tablet TAKE 1 TABLET BY MOUTH EVERY DAY 90 tablet 0   methocarbamol (ROBAXIN) 500 MG tablet Take 1 tablet (500 mg total) by mouth every 8 (eight) hours as needed for muscle spasms. 60 tablet 0   ondansetron (ZOFRAN-ODT) 4 MG disintegrating tablet Take 1 tablet (4 mg total) by mouth every 8 (eight) hours as needed for nausea or vomiting. 30 tablet 0   rizatriptan (MAXALT-MLT) 10 MG disintegrating tablet TAKE 1 TABLET BY MOUTH AS NEEDED FOR MIGRAINE. MAY REPEAT IN 2 HOURS IF NEEDED 10 tablet 2   Semaglutide-Weight Management 0.5 MG/0.5ML SOAJ Inject 0.5 mg into the skin once a week. 6 mL 0   No current facility-administered medications for this visit.    No Known Allergies  Family History  Problem Relation Age of Onset   Arthritis Mother    Bipolar disorder Father    ADD / ADHD Sister    ADD / ADHD Brother    Arthritis Maternal Grandfather    Hypertension Maternal Grandfather    Alcohol abuse Maternal Grandfather    Lung cancer Paternal Grandmother    Heart disease Maternal Uncle     Social History   Socioeconomic History   Marital status: Married    Spouse name: Not on file   Number of children: Not on file   Years of education: Not on file   Highest education level: Not on file  Occupational History   Not on file  Tobacco Use   Smoking status: Never   Smokeless tobacco: Never   Vaping Use   Vaping status: Never Used  Substance and Sexual Activity   Alcohol use: Yes    Comment: occasionally   Drug use: No   Sexual activity: Yes    Partners: Male    Birth control/protection: I.U.D.  Other Topics Concern   Not on file  Social History Narrative   Not on file   Social Drivers of Health   Financial Resource Strain: Not on file  Food Insecurity: Not on file  Transportation Needs: Not on file  Physical Activity: Not on file  Stress: Not on file  Social Connections: Not on file  Intimate Partner Violence: Not on file     Constitutional: Patient reports intermittent headaches.  Denies fever, malaise, fatigue, or abrupt weight changes.  HEENT: Pt reports breaking hair. Denies eye pain, eye redness, ear pain, ringing in the ears, wax buildup, runny nose, nasal congestion, bloody nose, or sore throat. Respiratory: Denies difficulty breathing, shortness of breath, cough or sputum production.   Cardiovascular: Denies chest pain, chest tightness, palpitations or swelling in the hands or feet.  Gastrointestinal: Denies abdominal pain, bloating, constipation, diarrhea or blood in the stool.  GU: Denies urgency, frequency, pain with urination, burning sensation, blood in urine, odor or discharge. Musculoskeletal: Patient reports chronic low back pain.  Denies decrease in range of  motion, difficulty with gait, or joint swelling.  Skin: Denies redness, rashes, lesions or ulcercations.  Neurological: Denies dizziness, difficulty with memory, difficulty with speech or problems with balance and coordination.  Psych: Denies anxiety, depression, SI/HI.  No other specific complaints in a complete review of systems (except as listed in HPI above).     Objective:   Physical Exam  BP 112/68 (BP Location: Right Arm, Patient Position: Sitting, Cuff Size: Normal)   Ht 5\' 5"  (1.651 m)   Wt 184 lb 6.4 oz (83.6 kg)   LMP  (LMP Unknown)   BMI 30.69 kg/m    Wt Readings from  Last 3 Encounters:  06/17/23 204 lb (92.5 kg)  02/04/23 194 lb (88 kg)  11/27/22 215 lb (97.5 kg)    General: Appears her stated age, obese, in NAD. Skin: Warm, dry and intact.  HEENT: Head: normal shape and size; Eyes: sclera white, no icterus, conjunctiva pink, PERRLA and EOMs intact;  Neck:  Neck supple, trachea midline. No masses, lumps or thyromegaly present.  Cardiovascular: Normal rate and rhythm. S1,S2 noted.  No murmur, rubs or gallops noted. No JVD or BLE edema.  Pulmonary/Chest: Normal effort and positive vesicular breath sounds. No respiratory distress. No wheezes, rales or ronchi noted.  Abdomen: Normal bowel sounds.  Musculoskeletal: Strength 5/5 BUE/BLE. No difficulty with gait.  Neurological: Alert and oriented. Cranial nerves II-XII grossly intact. Coordination normal.  Psychiatric: Mood and affect normal. Behavior is normal. Judgment and thought content normal.    BMET    Component Value Date/Time   NA 136 06/17/2023 0925   NA 137 10/23/2018 1044   K 4.4 06/17/2023 0925   CL 104 06/17/2023 0925   CO2 24 06/17/2023 0925   GLUCOSE 70 06/17/2023 0925   BUN 11 06/17/2023 0925   BUN 10 10/23/2018 1044   CREATININE 0.56 06/17/2023 0925   CALCIUM 9.1 06/17/2023 0925   GFRNONAA >60 11/13/2022 0808   GFRAA >60 01/13/2020 1826    Lipid Panel     Component Value Date/Time   CHOL 178 06/17/2023 0925   CHOL 167 10/23/2018 1044   TRIG 136 06/17/2023 0925   HDL 52 06/17/2023 0925   HDL 52 10/23/2018 1044   CHOLHDL 3.4 06/17/2023 0925   VLDL 34.0 12/30/2019 1510   LDLCALC 103 (H) 06/17/2023 0925    CBC    Component Value Date/Time   WBC 5.4 11/13/2022 0808   RBC 4.65 11/13/2022 0808   HGB 13.7 11/13/2022 0808   HGB 11.3 10/28/2019 0823   HCT 43.0 11/13/2022 0808   HCT 34.6 10/28/2019 0823   PLT 303 11/13/2022 0808   PLT 242 10/28/2019 0823   MCV 92.5 11/13/2022 0808   MCV 95 10/28/2019 0823   MCH 29.5 11/13/2022 0808   MCHC 31.9 11/13/2022 0808   RDW  13.3 11/13/2022 0808   RDW 11.6 (L) 10/28/2019 0823   LYMPHSABS 1.8 07/07/2019 1535   MONOABS 0.9 04/26/2015 1053   EOSABS 0.1 07/07/2019 1535   BASOSABS 0.0 07/07/2019 1535    Hgb A1C Lab Results  Component Value Date   HGBA1C 5.3 06/17/2023           Assessment & Plan:   Preventative Health Maintenance:  She declines flu shot today Tetanus UTD Encouraged her to get her covid booster Pap smear UTD Encouraged her to consume a balanced diet and exercise regimen Advised her to see a dentist annually Will check CBC, c-Met, TSH, Free T4, Lipid and A1C today  Hair problem:  Will check vitamin D, B12, biotin and folate  RTC in 6 months, follow up chronic conditions Nicki Reaper, NP

## 2023-11-29 NOTE — Assessment & Plan Note (Signed)
 Encourage high-protein, low-carb diet and exercise for weight loss

## 2023-11-30 LAB — COMPLETE METABOLIC PANEL WITH GFR
AG Ratio: 1.5 (calc) (ref 1.0–2.5)
ALT: 13 U/L (ref 6–29)
AST: 19 U/L (ref 10–30)
Albumin: 4.3 g/dL (ref 3.6–5.1)
Alkaline phosphatase (APISO): 52 U/L (ref 31–125)
BUN: 8 mg/dL (ref 7–25)
CO2: 26 mmol/L (ref 20–32)
Calcium: 9.4 mg/dL (ref 8.6–10.2)
Chloride: 105 mmol/L (ref 98–110)
Creat: 0.55 mg/dL (ref 0.50–0.96)
Globulin: 2.9 g/dL (ref 1.9–3.7)
Glucose, Bld: 77 mg/dL (ref 65–99)
Potassium: 4 mmol/L (ref 3.5–5.3)
Sodium: 138 mmol/L (ref 135–146)
Total Bilirubin: 0.6 mg/dL (ref 0.2–1.2)
Total Protein: 7.2 g/dL (ref 6.1–8.1)
eGFR: 128 mL/min/{1.73_m2} (ref >=60)

## 2023-12-02 ENCOUNTER — Encounter: Payer: Self-pay | Admitting: Internal Medicine

## 2023-12-09 LAB — LIPID PANEL
Cholesterol: 173 mg/dL (ref <200)
HDL: 45 mg/dL — ABNORMAL LOW (ref >=50)
LDL Cholesterol (Calc): 113 mg/dL — ABNORMAL HIGH
Non-HDL Cholesterol (Calc): 128 mg/dL (ref <130)
Total CHOL/HDL Ratio: 3.8 (calc) (ref <5.0)
Triglycerides: 64 mg/dL (ref <150)

## 2023-12-09 LAB — COMPREHENSIVE METABOLIC PANEL
AG Ratio: 1.5 (calc) (ref 1.0–2.5)
ALT: 13 U/L (ref 6–29)
AST: 19 U/L (ref 10–30)
Albumin: 4.3 g/dL (ref 3.6–5.1)
Alkaline phosphatase (APISO): 52 U/L (ref 31–125)
BUN: 8 mg/dL (ref 7–25)
CO2: 26 mmol/L (ref 20–32)
Calcium: 9.4 mg/dL (ref 8.6–10.2)
Chloride: 105 mmol/L (ref 98–110)
Creat: 0.55 mg/dL (ref 0.50–0.96)
Globulin: 2.9 g/dL (ref 1.9–3.7)
Glucose, Bld: 77 mg/dL (ref 65–99)
Potassium: 4 mmol/L (ref 3.5–5.3)
Sodium: 138 mmol/L (ref 135–146)
Total Bilirubin: 0.6 mg/dL (ref 0.2–1.2)
Total Protein: 7.2 g/dL (ref 6.1–8.1)
eGFR: 128 mL/min/{1.73_m2} (ref >=60)

## 2023-12-09 LAB — CBC
HCT: 40.8 % (ref 35.0–45.0)
Hemoglobin: 13.5 g/dL (ref 11.7–15.5)
MCH: 30.3 pg (ref 27.0–33.0)
MCHC: 33.1 g/dL (ref 32.0–36.0)
MCV: 91.7 fL (ref 80.0–100.0)
MPV: 11.2 fL (ref 7.5–12.5)
Platelets: 263 10*3/uL (ref 140–400)
RBC: 4.45 10*6/uL (ref 3.80–5.10)
RDW: 12.7 % (ref 11.0–15.0)
WBC: 6.3 10*3/uL (ref 3.8–10.8)

## 2023-12-09 LAB — HEMOGLOBIN A1C
Hgb A1c MFr Bld: 5.1 %{Hb} (ref <5.7)
Mean Plasma Glucose: 100 mg/dL
eAG (mmol/L): 5.5 mmol/L

## 2023-12-09 LAB — T4, FREE: Free T4: 1.1 ng/dL (ref 0.8–1.8)

## 2023-12-09 LAB — TSH: TSH: 3.08 m[IU]/L

## 2023-12-09 LAB — BIOTIN (VITAMIN B7): Biotin (Vitamin B7): >3600 pg/mL — ABNORMAL HIGH (ref 221.0–3004.0)

## 2023-12-18 ENCOUNTER — Encounter: Payer: Self-pay | Admitting: Internal Medicine

## 2023-12-20 MED ORDER — ONDANSETRON 4 MG PO TBDP: 4.0000 mg | ORAL_TABLET | Freq: Three times a day (TID) | ORAL | 0 refills | Status: DC | PRN

## 2023-12-23 ENCOUNTER — Other Ambulatory Visit: Payer: Self-pay | Admitting: Internal Medicine

## 2023-12-24 NOTE — Telephone Encounter (Signed)
 Last OV 11/30/23.  Requested Prescriptions  Pending Prescriptions Disp Refills   levothyroxine (SYNTHROID) 100 MCG tablet [Pharmacy Med Name: LEVOTHYROXINE 100 MCG TABLET] 90 tablet 1    Sig: TAKE 1 TABLET BY MOUTH EVERY DAY     Endocrinology:  Hypothyroid Agents Failed - 12/24/2023 10:37 AM      Failed - Valid encounter within last 12 months    Recent Outpatient Visits           3 weeks ago Encounter for general adult medical examination with abnormal findings   [facility] [place], [place] W, NP              Passed - TSH in normal range and within 360 days    TSH  Date Value Ref Range Status  11/29/2023 3.08 mIU/L Final    Comment:              Reference Range .           > or = 20 Years  0.40-4.50 .                Pregnancy Ranges           First trimester    0.26-2.66           Second trimester   0.55-2.73           Third trimester    0.43-2.91

## 2024-01-16 ENCOUNTER — Encounter: Payer: Self-pay | Admitting: Internal Medicine

## 2024-01-16 ENCOUNTER — Ambulatory Visit: Admitting: Internal Medicine

## 2024-01-16 VITALS — BP 114/72 | Ht 65.0 in | Wt 181.2 lb

## 2024-01-16 DIAGNOSIS — J020 Streptococcal pharyngitis: Secondary | ICD-10-CM

## 2024-01-16 LAB — POCT RAPID STREP A (OFFICE): Rapid Strep A Screen: POSITIVE — AB

## 2024-01-16 MED ORDER — AMOXICILLIN 500 MG PO CAPS: 500.0000 mg | ORAL_CAPSULE | Freq: Three times a day (TID) | ORAL | 0 refills | Status: DC

## 2024-01-16 NOTE — Progress Notes (Signed)
 Subjective:    Patient ID: Danielle Morgan, female    DOB: March 05, 1996, 28 y.o.   MRN: [id number]  HPI  Discussed the use of AI scribe software for clinical note transcription with the patient, who gave verbal consent to proceed.   Danielle Morgan is a 28 year old female who presents with a sore throat and left ear pain. She is accompanied by her child.  She has been experiencing a sore throat since Friday or Saturday, with the pain more pronounced on the left side. The sensation feels 'pretty swollen' and the pain radiates up behind her ear, causing soreness in the ear itself. No headaches, runny nose, or nasal congestion, but there is a little drainage in the morning. She feels more fatigued than usual over the past few days.  She took Tylenol  this morning, which has helped alleviate some of the pain. She recalls a previous episode of strep throat years ago, which was much more severe than her current symptoms. She mentions a little girl in her class was sick with strep, but her child has not been sick.       Review of Systems   Past Medical History:  Diagnosis Date   Frequent headaches    Thyroid  disease     Current Outpatient Medications  Medication Sig Dispense Refill   levothyroxine  (SYNTHROID ) 100 MCG tablet TAKE 1 TABLET BY MOUTH EVERY DAY 90 tablet 1   methocarbamol  (ROBAXIN ) 500 MG tablet Take 1 tablet (500 mg total) by mouth every 8 (eight) hours as needed for muscle spasms. 60 tablet 0   NURTEC 75 MG TBDP Take 1 tablet (75 mg total) by mouth daily as needed (migraine headache). Max 1 tablet in 24 hours. 8 tablet 5   ondansetron  (ZOFRAN -ODT) 4 MG disintegrating tablet Take 1 tablet (4 mg total) by mouth every 8 (eight) hours as needed for nausea or vomiting. 30 tablet 0   rizatriptan  (MAXALT -MLT) 10 MG disintegrating tablet TAKE 1 TABLET BY MOUTH AS NEEDED FOR MIGRAINE. MAY REPEAT IN 2 HOURS IF NEEDED 10 tablet 2   Semaglutide -Weight Management 0.5 MG/0.5ML SOAJ Inject 0.5 mg  into the skin once a week. 6 mL 0   No current facility-administered medications for this visit.    No Known Allergies  Family History  Problem Relation Age of Onset   Arthritis Mother    Bipolar disorder Father    ADD / ADHD Sister    ADD / ADHD Brother    Arthritis Maternal Grandfather    Hypertension Maternal Grandfather    Alcohol abuse Maternal Grandfather    Lung cancer Paternal Grandmother    Heart disease Maternal Uncle     Social History   Socioeconomic History   Marital status: Married    Spouse name: Not on file   Number of children: Not on file   Years of education: Not on file   Highest education level: Not on file  Occupational History   Not on file  Tobacco Use   Smoking status: Never   Smokeless tobacco: Never  Vaping Use   Vaping status: Never Used  Substance and Sexual Activity   Alcohol use: Yes    Comment: occasionally   Drug use: No   Sexual activity: Yes    Partners: Male    Birth control/protection: I.U.D.  Other Topics Concern   Not on file  Social History Narrative   Not on file   Social Drivers of Health   Financial Resource Strain: Not on  file  Food Insecurity: Not on file  Transportation Needs: Not on file  Physical Activity: Not on file  Stress: Not on file  Social Connections: Not on file  Intimate Partner Violence: Not on file     Constitutional: Denies fever, malaise, fatigue, headache or abrupt weight changes.  HEENT: Patient reports ear pain and sore throat.  Denies eye pain, eye redness, ringing in the ears, wax buildup, runny nose, nasal congestion, bloody nose. Respiratory: Denies difficulty breathing, shortness of breath, cough or sputum production.   Cardiovascular: Denies chest pain, chest tightness, palpitations or swelling in the hands or feet.  Gastrointestinal: Denies abdominal pain, bloating, constipation, diarrhea or blood in the stool.  Musculoskeletal: Denies decrease in range of motion, difficulty with  gait, muscle pain or joint pain and swelling.  Skin: Denies redness, rashes, lesions or ulcercations.   No other specific complaints in a complete review of systems (except as listed in HPI above).      Objective:   Physical Exam BP 114/72 (BP Location: Left Arm, Patient Position: Sitting, Cuff Size: Normal)   Ht 5\' 5"  (1.651 m)   Wt 181 lb 3.2 oz (82.2 kg)   BMI 30.15 kg/m   Wt Readings from Last 3 Encounters:  11/29/23 184 lb 6.4 oz (83.6 kg)  06/17/23 204 lb (92.5 kg)  02/04/23 194 lb (88 kg)    General: Appears her stated age, obese, in NAD. Skin: Warm, dry and intact. No rashes noted. HEENT: Head: normal shape and size, no sinus tenderness noted; Eyes: sclera white, no icterus, conjunctiva pink, PERRLA and EOMs intact; Ears: Tm's gray and intact, normal light reflex; Throat/Mouth: Teeth present, mucosa pink and moist, + PND, no exudate, lesions or ulcerations noted.  Neck: Cervical adenopathy noted on the right. Cardiovascular: Normal rate and rhythm. S1,S2 noted.  No murmur, rubs or gallops noted.  Pulmonary/Chest: Normal effort and positive vesicular breath sounds. No respiratory distress. No wheezes, rales or ronchi noted.  Neurological: Alert and oriented.  BMET    Component Value Date/Time   NA 138 11/29/2023 1114   NA 138 11/29/2023 1114   NA 137 10/23/2018 1044   K 4.0 11/29/2023 1114   K 4.0 11/29/2023 1114   CL 105 11/29/2023 1114   CL 105 11/29/2023 1114   CO2 26 11/29/2023 1114   CO2 26 11/29/2023 1114   GLUCOSE 77 11/29/2023 1114   GLUCOSE 77 11/29/2023 1114   BUN 8 11/29/2023 1114   BUN 8 11/29/2023 1114   BUN 10 10/23/2018 1044   CREATININE 0.55 11/29/2023 1114   CREATININE 0.55 11/29/2023 1114   CALCIUM 9.4 11/29/2023 1114   CALCIUM 9.4 11/29/2023 1114   GFRNONAA >60 11/13/2022 0808   GFRAA >60 01/13/2020 1826    Lipid Panel     Component Value Date/Time   CHOL 173 11/29/2023 1114   CHOL 167 10/23/2018 1044   TRIG 64 11/29/2023 1114    HDL 45 (L) 11/29/2023 1114   HDL 52 10/23/2018 1044   CHOLHDL 3.8 11/29/2023 1114   VLDL 34.0 12/30/2019 1510   LDLCALC 113 (H) 11/29/2023 1114    CBC    Component Value Date/Time   WBC 6.3 11/29/2023 1114   RBC 4.45 11/29/2023 1114   HGB 13.5 11/29/2023 1114   HGB 11.3 10/28/2019 0823   HCT 40.8 11/29/2023 1114   HCT 34.6 10/28/2019 0823   PLT 263 11/29/2023 1114   PLT 242 10/28/2019 0823   MCV 91.7 11/29/2023 1114   MCV  95 10/28/2019 0823   MCH 30.3 11/29/2023 1114   MCHC 33.1 11/29/2023 1114   RDW 12.7 11/29/2023 1114   RDW 11.6 (L) 10/28/2019 0823   LYMPHSABS 1.8 07/07/2019 1535   MONOABS 0.9 04/26/2015 1053   EOSABS 0.1 07/07/2019 1535   BASOSABS 0.0 07/07/2019 1535    Hgb A1C Lab Results  Component Value Date   HGBA1C 5.1 11/29/2023            Assessment & Plan:  Assessment and Plan    Streptococcal pharyngitis Positive rapid strep test. Contagious until 24 hours after starting antibiotics. - Prescribed amoxicillin  500 mg TID for 10 days. - Recommended ibuprofen  PRN, up to 800 mg every 8 hours. - Advised salt water gargles.        RTC in 4 months for follow-up of chronic conditions Helayne Lo, NP

## 2024-01-16 NOTE — Patient Instructions (Signed)
 Strep Throat, Adult Strep throat is an infection of the throat. It is caused by germs (bacteria). Strep throat is common during the cold months of the year. It mostly affects children who are 8-28 years old. However, people of all ages can get it at any time of the year. This infection spreads from person to person through coughing, sneezing, or having close contact. What are the causes? This condition is caused by the Streptococcus pyogenes germ. What increases the risk? 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 Children are more likely to get strep throat and may spread it to others. You go to crowded places. Germs can spread easily in such places. You kiss or touch someone who has strep throat. What are the signs or symptoms? Fever or chills. Redness, swelling, or pain in the tonsils or throat. Pain or trouble when swallowing. White or yellow spots on the tonsils or throat. Tender glands in the neck and under the jaw. Bad breath. Red rash all over the body. This is rare. How is this treated? Medicines that kill germs (antibiotics). Medicines that treat pain or fever. These include: Ibuprofen or acetaminophen. Aspirin, only for people who are over the age of 54. Cough drops. Throat sprays. Follow these instructions at home: Medicines  Take over-the-counter and prescription medicines only as told by your doctor. Take your antibiotic medicine as told by your doctor. Do not stop taking the antibiotic even if you start to feel better. Eating and drinking  If you have trouble swallowing, eat soft foods until your throat feels better. Drink enough fluid to keep your pee (urine) pale yellow. To help with pain, you may have: Warm fluids, such as soup and tea. Cold fluids, such as frozen desserts or popsicles. General instructions Rinse your mouth (gargle) with a salt-water mixture 3-4 times a day or as needed. To make a salt-water mixture, dissolve -1 tsp (3-6 g) of salt in 1 cup (237 mL) of warm  water. Rest as much as you can. Stay home from work or school until you have been taking antibiotics for 24 hours. Do not smoke or use any products that contain nicotine or tobacco. If you need help quitting, ask your doctor. Keep all follow-up visits. How is this prevented?  Do not share food, drinking cups, or personal items. They can cause the germs to spread. Wash your hands well with soap and water. Make sure that all people in your house wash their hands well. Have family members tested if they have a fever or a sore throat. They may need an antibiotic if they have strep throat. Contact a doctor if: You have swelling in your neck that keeps getting bigger. You get a rash, cough, or earache. You cough up a thick fluid that is green, yellow-brown, or bloody. You have pain that does not get better with medicine. Your symptoms get worse instead of getting better. You have a fever. Get help right away if: You vomit. You have a very bad headache. Your neck hurts or feels stiff. You have chest pain or are short of breath. You have drooling, very bad throat pain, or changes in your voice. Your neck is swollen, or the skin gets red and tender. Your mouth is dry, or you are peeing less than normal. You keep feeling more tired or have trouble waking up. Your joints are red or painful. These symptoms may be an emergency. Do not wait to see if the symptoms will go away. Get help right away. Call  your local emergency services (911 in the [place] Summary Strep throat is an infection of the throat. It is caused by germs (bacteria). This infection can spread from person to person through coughing, sneezing, or having close contact. Take your medicines, including antibiotics, as told by your doctor. Do not stop taking the antibiotic even if you start to feel better. To prevent the spread of germs, wash your hands well with soap and water. Have others do the same. Do not share food, drinking cups,  or personal items. Get help right away if you have a bad headache, chest pain, shortness of breath, a stiff or painful neck, or you vomit. This information is not intended to replace advice given to you by your health care provider. Make sure you discuss any questions you have with your health care provider. Document Revised: 12/27/2020 Document Reviewed: 12/27/2020 Elsevier Patient Education  2024 [employer].

## 2024-01-17 MED ORDER — FLUCONAZOLE 150 MG PO TABS: 150.0000 mg | ORAL_TABLET | Freq: Once | ORAL | 0 refills | Status: DC

## 2024-01-21 ENCOUNTER — Encounter: Payer: Self-pay | Admitting: Internal Medicine

## 2024-01-22 MED ORDER — FLUCONAZOLE 150 MG PO TABS: 150.0000 mg | ORAL_TABLET | Freq: Once | ORAL | 0 refills | Status: AC

## 2024-01-22 NOTE — Addendum Note (Signed)
 Addended by: Carollynn Cirri on: 01/22/2024 07:58 AM   Modules accepted: Orders

## 2024-01-22 NOTE — Telephone Encounter (Signed)
 If we are going to be changing medications, it's probably best to set up an appt to discuss. Virtual is fine.

## 2024-01-23 ENCOUNTER — Encounter: Payer: Self-pay | Admitting: Internal Medicine

## 2024-01-23 ENCOUNTER — Telehealth: Admitting: Internal Medicine

## 2024-01-23 ENCOUNTER — Telehealth: Payer: Self-pay

## 2024-01-23 DIAGNOSIS — G43709 Chronic migraine without aura, not intractable, without status migrainosus: Secondary | ICD-10-CM

## 2024-01-23 DIAGNOSIS — G43701 Chronic migraine without aura, not intractable, with status migrainosus: Secondary | ICD-10-CM

## 2024-01-23 MED ORDER — QULIPTA 10 MG PO TABS: 10.0000 mg | ORAL_TABLET | Freq: Every day | ORAL | 1 refills | Status: DC

## 2024-01-23 NOTE — Telephone Encounter (Addendum)
 Danielle Morgan (Key: [id number]) PA Case ID #: [id number] Rx #: [id number] Need Help? Call us  at [phone] Status sent iconSent to Plan today Drug Qulipta  10MG  tablets ePA cloud logo Form [employer] [place] [employer] Electronic Request Form Original Claim Info 75   Approved 01/23/24-04/16/24

## 2024-01-23 NOTE — Progress Notes (Signed)
 Virtual Visit via Video Note  I connected with Danielle Morgan on 01/23/24 at  3:40 PM EDT by a video enabled telemedicine application and verified that I am speaking with the correct person using two identifiers.  Location: Patient: Home Provider: Office  Person's participating in this video call: Helayne Lo, NP-C and Candias Tackitt   I discussed the limitations of evaluation and management by telemedicine and the availability of in person appointments. The patient expressed understanding and agreed to proceed.  History of Present Illness:  Discussed the use of AI scribe software for clinical note transcription with the patient, who gave verbal consent to proceed.  Danielle Morgan is a 28 year old female with migraines who presents with ineffective migraine treatment and increased symptoms.  She has been taking nurtec for her migraines, using it approximately four times without relief. Previously, she experienced vomiting with migraines, which had subsided but has recently returned with the last two episodes.  She has not taken zofran  recently because her migraines have been 'one and done', typically resolving after she vomits and rests. She previously used rizatriptan , which was effective for a time but eventually became less reliable, prompting the switch to nurtec.  Over the past weekend, she experienced a severe migraine on Saturday morning, accompanied by vomiting. Her sister-in-law recommended qulipta, which she tried and found effective in alleviating her symptoms. She used a 10 mg dose, which was her sister-in-law's last pill.  She experiences migraines at least a couple of times a month, with at least one occurring around her menstrual cycle. The migraines can last from one to three days, often requiring her to call out of work due to their severity.         Past Medical History:  Diagnosis Date   Frequent headaches    Thyroid  disease     Current Outpatient Medications   Medication Sig Dispense Refill   amoxicillin  (AMOXIL ) 500 MG capsule Take 1 capsule (500 mg total) by mouth 3 (three) times daily. 30 capsule 0   levothyroxine  (SYNTHROID ) 100 MCG tablet TAKE 1 TABLET BY MOUTH EVERY DAY 90 tablet 1   methocarbamol  (ROBAXIN ) 500 MG tablet Take 1 tablet (500 mg total) by mouth every 8 (eight) hours as needed for muscle spasms. 60 tablet 0   NURTEC 75 MG TBDP Take 1 tablet (75 mg total) by mouth daily as needed (migraine headache). Max 1 tablet in 24 hours. 8 tablet 5   ondansetron  (ZOFRAN -ODT) 4 MG disintegrating tablet Take 1 tablet (4 mg total) by mouth every 8 (eight) hours as needed for nausea or vomiting. 30 tablet 0   rizatriptan  (MAXALT -MLT) 10 MG disintegrating tablet TAKE 1 TABLET BY MOUTH AS NEEDED FOR MIGRAINE. MAY REPEAT IN 2 HOURS IF NEEDED 10 tablet 2   Semaglutide -Weight Management 0.5 MG/0.5ML SOAJ Inject 0.5 mg into the skin once a week. 6 mL 0   No current facility-administered medications for this visit.    No Known Allergies  Family History  Problem Relation Age of Onset   Arthritis Mother    Bipolar disorder Father    ADD / ADHD Sister    ADD / ADHD Brother    Arthritis Maternal Grandfather    Hypertension Maternal Grandfather    Alcohol abuse Maternal Grandfather    Lung cancer Paternal Grandmother    Heart disease Maternal Uncle     Social History   Socioeconomic History   Marital status: Married    Spouse name: Not on file  Number of children: Not on file   Years of education: Not on file   Highest education level: Not on file  Occupational History   Not on file  Tobacco Use   Smoking status: Never   Smokeless tobacco: Never  Vaping Use   Vaping status: Never Used  Substance and Sexual Activity   Alcohol use: Yes    Comment: occasionally   Drug use: No   Sexual activity: Yes    Partners: Male    Birth control/protection: I.U.D.  Other Topics Concern   Not on file  Social History Narrative   Not on file    Social Drivers of Health   Financial Resource Strain: Not on file  Food Insecurity: Not on file  Transportation Needs: Not on file  Physical Activity: Not on file  Stress: Not on file  Social Connections: Not on file  Intimate Partner Violence: Not on file     Constitutional: Pt reports intermittent headaches. Denies fever, malaise, fatigue, or abrupt weight changes.  HEENT: Denies eye pain, eye redness, ear pain, ringing in the ears, wax buildup, runny nose, nasal congestion, bloody nose, or sore throat. Respiratory: Denies difficulty breathing, shortness of breath, cough or sputum production.   Cardiovascular: Denies chest pain, chest tightness, palpitations or swelling in the hands or feet.  Gastrointestinal: Pt reports nausea and vomiting with headaches. Denies abdominal pain, bloating, constipation, diarrhea or blood in the stool.  GU: Denies urgency, frequency, pain with urination, burning sensation, blood in urine, odor or discharge. Musculoskeletal: Pt reports intermittent low back pain. Denies decrease in range of motion, difficulty with gait, or joint swelling.  Skin: Denies redness, rashes, lesions or ulcercations.  Neurological: Denies dizziness, difficulty with memory, difficulty with speech or problems with balance and coordination.  Psych: Denies anxiety, depression, SI/HI.  No other specific complaints in a complete review of systems (except as listed in HPI above).  Observations/Objective:   Wt Readings from Last 3 Encounters:  01/16/24 181 lb 3.2 oz (82.2 kg)  11/29/23 184 lb 6.4 oz (83.6 kg)  06/17/23 204 lb (92.5 kg)    General: Appears her stated age, in NAD. Pulmonary/Chest: Normal effort. No respiratory distress.  Neurological: Alert and oriented. Coordination normal.    BMET    Component Value Date/Time   NA 138 11/29/2023 1114   NA 138 11/29/2023 1114   NA 137 10/23/2018 1044   K 4.0 11/29/2023 1114   K 4.0 11/29/2023 1114   CL 105  11/29/2023 1114   CL 105 11/29/2023 1114   CO2 26 11/29/2023 1114   CO2 26 11/29/2023 1114   GLUCOSE 77 11/29/2023 1114   GLUCOSE 77 11/29/2023 1114   BUN 8 11/29/2023 1114   BUN 8 11/29/2023 1114   BUN 10 10/23/2018 1044   CREATININE 0.55 11/29/2023 1114   CREATININE 0.55 11/29/2023 1114   CALCIUM 9.4 11/29/2023 1114   CALCIUM 9.4 11/29/2023 1114   GFRNONAA >60 11/13/2022 0808   GFRAA >60 01/13/2020 1826    Lipid Panel     Component Value Date/Time   CHOL 173 11/29/2023 1114   CHOL 167 10/23/2018 1044   TRIG 64 11/29/2023 1114   HDL 45 (L) 11/29/2023 1114   HDL 52 10/23/2018 1044   CHOLHDL 3.8 11/29/2023 1114   VLDL 34.0 12/30/2019 1510   LDLCALC 113 (H) 11/29/2023 1114    CBC    Component Value Date/Time   WBC 6.3 11/29/2023 1114   RBC 4.45 11/29/2023 1114   HGB  13.5 11/29/2023 1114   HGB 11.3 10/28/2019 0823   HCT 40.8 11/29/2023 1114   HCT 34.6 10/28/2019 0823   PLT 263 11/29/2023 1114   PLT 242 10/28/2019 0823   MCV 91.7 11/29/2023 1114   MCV 95 10/28/2019 0823   MCH 30.3 11/29/2023 1114   MCHC 33.1 11/29/2023 1114   RDW 12.7 11/29/2023 1114   RDW 11.6 (L) 10/28/2019 0823   LYMPHSABS 1.8 07/07/2019 1535   MONOABS 0.9 04/26/2015 1053   EOSABS 0.1 07/07/2019 1535   BASOSABS 0.0 07/07/2019 1535    Hgb A1C Lab Results  Component Value Date   HGBA1C 5.1 11/29/2023       Assessment and Plan:  Assessment and Plan    Migraine Chronic migraines, twice monthly, linked to menstrual cycle, exacerbated with vomiting. Nurtec and Rizatriptan  ineffective. Qulipta 10 mg effective. Significant impact on daily function and work. - Prescribed Qulipta 10 mg daily. - Discontinued Nurtec and Maxalt . - Advised to monitor effectiveness and report if 10 mg is insufficient. - Discussed potential dose increase to 30 mg if needed.    RTC in 4 months, followup chronic conditions Follow Up Instructions:    I discussed the assessment and treatment plan with the  patient. The patient was provided an opportunity to ask questions and all were answered. The patient agreed with the plan and demonstrated an understanding of the instructions.   The patient was advised to call back or seek an in-person evaluation if the symptoms worsen or if the condition fails to improve as anticipated.   Helayne Lo, NP

## 2024-01-23 NOTE — Patient Instructions (Signed)
 Migraine Headache A migraine headache is a very strong throbbing pain on one or both sides of your head. This type of headache can also cause other symptoms. It can last from 4 hours to 3 days. Talk with your doctor about what things may bring on (trigger) this condition. What are the causes? The exact cause of a migraine is not known. This condition may be brought on or caused by: Smoking. Medicines, such as: Medicine used to treat chest pain (nitroglycerin). Birth control pills. Estrogen. Some blood pressure medicines. Certain substances in some foods or drinks. Foods and drinks, such as: Cheese. Chocolate. Alcohol. Caffeine. Doing physical activity that is very hard. Other things that may trigger a migraine headache include: Periods. Pregnancy. Hunger. Stress. Getting too much or too little sleep. Weather changes. Feeling tired (fatigue). What increases the risk? Being 18-65 years old. Being female. Having a family history of migraine headaches. Being Caucasian. Having a mental health condition, such as being sad (depressed) or feeling worried or nervous (anxious). Being very overweight (obese). What are the signs or symptoms? A throbbing pain. This pain may: Happen in any area of the head, such as on one or both sides. Make it hard to do daily activities. Get worse with physical activity. Get worse around bright lights, loud noises, or smells. Other symptoms may include: Feeling like you may vomit (nauseous). Vomiting. Dizziness. Before a migraine headache starts, you may get warning signs (an aura). An aura may include: Seeing flashing lights or having blind spots. Seeing bright spots, halos, or zigzag lines. Having tunnel vision or blurred vision. Having numbness or a tingling feeling. Having trouble talking. Having weak muscles. After a migraine ends, you may have symptoms. These may include: Tiredness. Trouble thinking (concentrating). How is this  treated? Taking medicines that: Relieve pain. Relieve the feeling like you may vomit. Prevent migraine headaches. Treatment may also include: Acupuncture. Lifestyle changes like avoiding foods that bring on migraine headaches. Learning ways to control your body functions (biofeedback). Therapy to help you know and deal with negative thoughts (cognitive behavioral therapy). Follow these instructions at home: Medicines Take over-the-counter and prescription medicines only as told by your doctor. If told, take steps to prevent problems with pooping (constipation). You may need to: Drink enough fluid to keep your pee (urine) pale yellow. Take medicines. You will be told what medicines to take. Eat foods that are high in fiber. These include beans, whole grains, and fresh fruits and vegetables. Limit foods that are high in fat and sugar. These include fried or sweet foods. Ask your doctor if you should avoid driving or using machines while you are taking your medicine. Lifestyle  Do not drink alcohol. Do not smoke or use any products that contain nicotine or tobacco. If you need help quitting, ask your doctor. Get 7-9 hours of sleep each night, or the amount recommended by your doctor. Find ways to deal with stress, such as meditation, deep breathing, or yoga. Try to exercise often. This can help lessen how bad and how often your migraines happen. General instructions Keep a journal to find out what may bring on your migraine headaches. This can help you avoid those things. For example, write down: What you eat and drink. How much sleep you get. Any change to your medicines or diet. If you have a migraine headache: Avoid things that make your symptoms worse, such as bright lights. Lie down in a dark, quiet room. Do not drive or use machinery. Ask your  doctor what activities are safe for you. Where to find more information Coalition for Headache and Migraine Patients (CHAMP):  headachemigraine.org American Migraine Foundation: americanmigrainefoundation.org National Headache Foundation: headaches.org Contact a doctor if: You get a migraine headache that is different or worse than others you have had. You have more than 15 days of headaches in one month. Get help right away if: Your migraine headache gets very bad. Your migraine headache lasts more than 72 hours. You have a fever or stiff neck. You have trouble seeing. Your muscles feel weak or like you cannot control them. You lose your balance a lot. You have trouble walking. You faint. You have a seizure. This information is not intended to replace advice given to you by your health care provider. Make sure you discuss any questions you have with your health care provider. Document Revised: 04/30/2022 Document Reviewed: 04/30/2022 Elsevier Patient Education  2024 [employer].

## 2024-02-12 ENCOUNTER — Encounter: Payer: Self-pay | Admitting: Internal Medicine

## 2024-02-12 ENCOUNTER — Ambulatory Visit (INDEPENDENT_AMBULATORY_CARE_PROVIDER_SITE_OTHER): Admitting: Internal Medicine

## 2024-02-12 VITALS — BP 108/70 | Ht 65.0 in | Wt 182.2 lb

## 2024-02-12 DIAGNOSIS — E6609 Other obesity due to excess calories: Secondary | ICD-10-CM | POA: Diagnosis not present

## 2024-02-12 DIAGNOSIS — B379 Candidiasis, unspecified: Secondary | ICD-10-CM | POA: Diagnosis not present

## 2024-02-12 DIAGNOSIS — E66811 Obesity, class 1: Secondary | ICD-10-CM

## 2024-02-12 DIAGNOSIS — J020 Streptococcal pharyngitis: Secondary | ICD-10-CM

## 2024-02-12 DIAGNOSIS — Z6830 Body mass index (BMI) 30.0-30.9, adult: Secondary | ICD-10-CM

## 2024-02-12 LAB — POCT RAPID STREP A (OFFICE): Rapid Strep A Screen: POSITIVE — AB

## 2024-02-12 MED ORDER — FLUCONAZOLE 150 MG PO TABS: 150.0000 mg | ORAL_TABLET | ORAL | 0 refills | Status: DC | PRN

## 2024-02-12 MED ORDER — AMOXICILLIN-POT CLAVULANATE 875-125 MG PO TABS: 1.0000 | ORAL_TABLET | Freq: Two times a day (BID) | ORAL | 0 refills | Status: DC

## 2024-02-12 MED ORDER — SEMAGLUTIDE-WEIGHT MANAGEMENT 1 MG/0.5ML ~~LOC~~ SOAJ: 1.0000 mg | SUBCUTANEOUS | 0 refills | Status: DC

## 2024-02-12 NOTE — Progress Notes (Signed)
 Subjective:    Patient ID: Danielle Morgan, female    DOB: 09-Oct-1995, 28 y.o.   MRN: [id number]  HPI  Discussed the use of AI scribe software for clinical note transcription with the patient, who gave verbal consent to proceed.  Danielle Morgan is a 28 year old female who presents with a sore throat and cough. She is accompanied by her child.  She developed a sore throat on Monday evening, which is slightly painful when swallowing. The sore throat has improved since yesterday. The following night, she developed a cough. There is no sputum production, but she notes some drainage.  She denies fever, chills, nausea, vomiting, or significant headache. She did experience a slight headache upon waking, which she attributes to poor sleep the previous night.  She took tylenol  a couple of days ago for her symptoms but has not needed to take it again as the symptoms have not been bothersome enough.  She was diagnosed with a similar condition on May 1st, approximately 27 days ago, and was treated with amoxicillin  at that time.   She would like to increase the dose of her wegovy . She reports her weight loss has plateaued.      Review of Systems   Past Medical History:  Diagnosis Date   Frequent headaches    Thyroid  disease     Current Outpatient Medications  Medication Sig Dispense Refill   amoxicillin  (AMOXIL ) 500 MG capsule Take 1 capsule (500 mg total) by mouth 3 (three) times daily. 30 capsule 0   Atogepant  (QULIPTA ) 10 MG TABS Take 10 mg by mouth daily. 90 tablet 1   levothyroxine  (SYNTHROID ) 100 MCG tablet TAKE 1 TABLET BY MOUTH EVERY DAY 90 tablet 1   methocarbamol  (ROBAXIN ) 500 MG tablet Take 1 tablet (500 mg total) by mouth every 8 (eight) hours as needed for muscle spasms. 60 tablet 0   ondansetron  (ZOFRAN -ODT) 4 MG disintegrating tablet Take 1 tablet (4 mg total) by mouth every 8 (eight) hours as needed for nausea or vomiting. 30 tablet 0   Semaglutide -Weight Management 0.5  MG/0.5ML SOAJ Inject 0.5 mg into the skin once a week. 6 mL 0   No current facility-administered medications for this visit.    No Known Allergies  Family History  Problem Relation Age of Onset   Arthritis Mother    Bipolar disorder Father    ADD / ADHD Sister    ADD / ADHD Brother    Arthritis Maternal Grandfather    Hypertension Maternal Grandfather    Alcohol abuse Maternal Grandfather    Lung cancer Paternal Grandmother    Heart disease Maternal Uncle     Social History   Socioeconomic History   Marital status: Married    Spouse name: Not on file   Number of children: Not on file   Years of education: Not on file   Highest education level: Not on file  Occupational History   Not on file  Tobacco Use   Smoking status: Never   Smokeless tobacco: Never  Vaping Use   Vaping status: Never Used  Substance and Sexual Activity   Alcohol use: Yes    Comment: occasionally   Drug use: No   Sexual activity: Yes    Partners: Male    Birth control/protection: I.U.D.  Other Topics Concern   Not on file  Social History Narrative   Not on file   Social Drivers of Health   Financial Resource Strain: Not on file  Food Insecurity:  Not on file  Transportation Needs: Not on file  Physical Activity: Not on file  Stress: Not on file  Social Connections: Not on file  Intimate Partner Violence: Not on file     Constitutional: Pt reports headache. Denies fever, malaise, fatigue, or abrupt weight changes.  HEENT: Pt reports sore throat. Denies eye pain, eye redness, ear pain, ringing in the ears, wax buildup, runny nose, nasal congestion, bloody nose, or sore throat. Respiratory: Pt reports cough. Denies difficulty breathing, shortness of breath, or sputum production.   Cardiovascular: Denies chest pain, chest tightness, palpitations or swelling in the hands or feet.  Gastrointestinal: Denies abdominal pain, bloating, constipation, diarrhea or blood in the stool.   No other  specific complaints in a complete review of systems (except as listed in HPI above).      Objective:   Physical Exam  BP 108/70 (BP Location: Left Arm, Patient Position: Sitting, Cuff Size: Normal)   Ht 5\' 5"  (1.651 m)   Wt 182 lb 3 oz (82.6 kg)   BMI 30.32 kg/m   Wt Readings from Last 3 Encounters:  01/16/24 181 lb 3.2 oz (82.2 kg)  11/29/23 184 lb 6.4 oz (83.6 kg)  06/17/23 204 lb (92.5 kg)    General: Appears her stated age, obese, in NAD. HEENT: Head: normal shape and size; Eyes: sclera white, no icterus, conjunctiva pink, PERRLA and EOMs intact; Throat/Mouth: Teeth present, mucosa erythematous and moist, tonsils 2+, no exudate, lesions or ulcerations noted.  Neck: No adenopathy noted. Cardiovascular: Normal rate and rhythm. S1,S2 noted.  No murmur, rubs or gallops noted.  Pulmonary/Chest: Normal effort and positive vesicular breath sounds. No respiratory distress. No wheezes, rales or ronchi noted.  Neurological: Alert and oriented. Cranial nerves II-XII grossly intact. Coordination normal.   BMET    Component Value Date/Time   NA 138 11/29/2023 1114   NA 138 11/29/2023 1114   NA 137 10/23/2018 1044   K 4.0 11/29/2023 1114   K 4.0 11/29/2023 1114   CL 105 11/29/2023 1114   CL 105 11/29/2023 1114   CO2 26 11/29/2023 1114   CO2 26 11/29/2023 1114   GLUCOSE 77 11/29/2023 1114   GLUCOSE 77 11/29/2023 1114   BUN 8 11/29/2023 1114   BUN 8 11/29/2023 1114   BUN 10 10/23/2018 1044   CREATININE 0.55 11/29/2023 1114   CREATININE 0.55 11/29/2023 1114   CALCIUM 9.4 11/29/2023 1114   CALCIUM 9.4 11/29/2023 1114   GFRNONAA >60 11/13/2022 0808   GFRAA >60 01/13/2020 1826    Lipid Panel     Component Value Date/Time   CHOL 173 11/29/2023 1114   CHOL 167 10/23/2018 1044   TRIG 64 11/29/2023 1114   HDL 45 (L) 11/29/2023 1114   HDL 52 10/23/2018 1044   CHOLHDL 3.8 11/29/2023 1114   VLDL 34.0 12/30/2019 1510   LDLCALC 113 (H) 11/29/2023 1114    CBC    Component  Value Date/Time   WBC 6.3 11/29/2023 1114   RBC 4.45 11/29/2023 1114   HGB 13.5 11/29/2023 1114   HGB 11.3 10/28/2019 0823   HCT 40.8 11/29/2023 1114   HCT 34.6 10/28/2019 0823   PLT 263 11/29/2023 1114   PLT 242 10/28/2019 0823   MCV 91.7 11/29/2023 1114   MCV 95 10/28/2019 0823   MCH 30.3 11/29/2023 1114   MCHC 33.1 11/29/2023 1114   RDW 12.7 11/29/2023 1114   RDW 11.6 (L) 10/28/2019 0823   LYMPHSABS 1.8 07/07/2019 1535   MONOABS  0.9 04/26/2015 1053   EOSABS 0.1 07/07/2019 1535   BASOSABS 0.0 07/07/2019 1535    Hgb A1C Lab Results  Component Value Date   HGBA1C 5.1 11/29/2023            Assessment & Plan:  Assessment and Plan    Streptococcal pharyngitis, antibiotic induced yeast infection 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 Previous amoxicillin  treatment suggests possible residual or new infection. - Prescribed Augmentin  875 mg/125 mg BID for 10 days. - Prescribed Diflucan , 2 tablets, to be taken three days apart if needed.      RTC in 4 months for follow-up of chronic conditions Helayne Lo, NP

## 2024-02-12 NOTE — Assessment & Plan Note (Signed)
Will increase Wegovy to 1 mg weekly.

## 2024-02-12 NOTE — Patient Instructions (Signed)
 Strep Throat, Adult Strep throat is an infection of the throat. It is caused by germs (bacteria). Strep throat is common during the cold months of the year. It mostly affects children who are 8-28 years old. However, people of all ages can get it at any time of the year. This infection spreads from person to person through coughing, sneezing, or having close contact. What are the causes? This condition is caused by the Streptococcus pyogenes germ. What increases the risk? 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 Children are more likely to get strep throat and may spread it to others. You go to crowded places. Germs can spread easily in such places. You kiss or touch someone who has strep throat. What are the signs or symptoms? Fever or chills. Redness, swelling, or pain in the tonsils or throat. Pain or trouble when swallowing. White or yellow spots on the tonsils or throat. Tender glands in the neck and under the jaw. Bad breath. Red rash all over the body. This is rare. How is this treated? Medicines that kill germs (antibiotics). Medicines that treat pain or fever. These include: Ibuprofen or acetaminophen. Aspirin, only for people who are over the age of 54. Cough drops. Throat sprays. Follow these instructions at home: Medicines  Take over-the-counter and prescription medicines only as told by your doctor. Take your antibiotic medicine as told by your doctor. Do not stop taking the antibiotic even if you start to feel better. Eating and drinking  If you have trouble swallowing, eat soft foods until your throat feels better. Drink enough fluid to keep your pee (urine) pale yellow. To help with pain, you may have: Warm fluids, such as soup and tea. Cold fluids, such as frozen desserts or popsicles. General instructions Rinse your mouth (gargle) with a salt-water mixture 3-4 times a day or as needed. To make a salt-water mixture, dissolve -1 tsp (3-6 g) of salt in 1 cup (237 mL) of warm  water. Rest as much as you can. Stay home from work or school until you have been taking antibiotics for 24 hours. Do not smoke or use any products that contain nicotine or tobacco. If you need help quitting, ask your doctor. Keep all follow-up visits. How is this prevented?  Do not share food, drinking cups, or personal items. They can cause the germs to spread. Wash your hands well with soap and water. Make sure that all people in your house wash their hands well. Have family members tested if they have a fever or a sore throat. They may need an antibiotic if they have strep throat. Contact a doctor if: You have swelling in your neck that keeps getting bigger. You get a rash, cough, or earache. You cough up a thick fluid that is green, yellow-brown, or bloody. You have pain that does not get better with medicine. Your symptoms get worse instead of getting better. You have a fever. Get help right away if: You vomit. You have a very bad headache. Your neck hurts or feels stiff. You have chest pain or are short of breath. You have drooling, very bad throat pain, or changes in your voice. Your neck is swollen, or the skin gets red and tender. Your mouth is dry, or you are peeing less than normal. You keep feeling more tired or have trouble waking up. Your joints are red or painful. These symptoms may be an emergency. Do not wait to see if the symptoms will go away. Get help right away. Call  your local emergency services (911 in the [place] Summary Strep throat is an infection of the throat. It is caused by germs (bacteria). This infection can spread from person to person through coughing, sneezing, or having close contact. Take your medicines, including antibiotics, as told by your doctor. Do not stop taking the antibiotic even if you start to feel better. To prevent the spread of germs, wash your hands well with soap and water. Have others do the same. Do not share food, drinking cups,  or personal items. Get help right away if you have a bad headache, chest pain, shortness of breath, a stiff or painful neck, or you vomit. This information is not intended to replace advice given to you by your health care provider. Make sure you discuss any questions you have with your health care provider. Document Revised: 12/27/2020 Document Reviewed: 12/27/2020 Elsevier Patient Education  2024 [employer].

## 2024-04-22 ENCOUNTER — Telehealth: Payer: Self-pay

## 2024-04-22 ENCOUNTER — Other Ambulatory Visit (HOSPITAL_COMMUNITY): Payer: Self-pay

## 2024-04-22 NOTE — Telephone Encounter (Signed)
 Pharmacy Patient Advocate Encounter   Received notification from CoverMyMeds that prior authorization for Qulipta  10MG  tablets  is required/requested.   Insurance verification completed.   The patient is insured through [facility] .   Per test claim: PA required; PA started via CoverMyMeds. KEY [id number] . Please see clinical question(s) below that I am not finding the answer to in their chart and advise.

## 2024-04-22 NOTE — Telephone Encounter (Signed)
 Pharmacy Patient Advocate Encounter   Received notification from CoverMyMeds that prior authorization for Qulipta  10MG  tablets  is required/requested.   Insurance verification completed.   The patient is insured through [facility] .   Per test claim: PA required; PA submitted to above mentioned insurance via CoverMyMeds Key/confirmation #/EOC [id number] Status is pending

## 2024-04-22 NOTE — Telephone Encounter (Signed)
 Yes, she has had a significant decrease in her migraines with qulipta 

## 2024-04-24 ENCOUNTER — Other Ambulatory Visit (HOSPITAL_COMMUNITY): Payer: Self-pay

## 2024-04-24 NOTE — Telephone Encounter (Signed)
 Pharmacy Patient Advocate Encounter  Received notification from [facility] that Prior Authorization for Qulipta  10MG  tablets  has been APPROVED from 04/22/24 to 04/22/25. Ran test claim, Copay is $0. This test claim was processed through [facility]- copay amounts may vary at other pharmacies due to pharmacy/plan contracts, or as the patient moves through the different stages of their insurance plan.   PA #/Case ID/Reference #: [id number]

## 2024-06-01 ENCOUNTER — Encounter: Payer: Self-pay | Admitting: Internal Medicine

## 2024-06-01 ENCOUNTER — Ambulatory Visit: Admitting: Internal Medicine

## 2024-06-01 ENCOUNTER — Ambulatory Visit (INDEPENDENT_AMBULATORY_CARE_PROVIDER_SITE_OTHER): Admitting: Internal Medicine

## 2024-06-01 VITALS — BP 108/68 | Ht 65.0 in | Wt 167.0 lb

## 2024-06-01 DIAGNOSIS — G43909 Migraine, unspecified, not intractable, without status migrainosus: Secondary | ICD-10-CM | POA: Diagnosis not present

## 2024-06-01 DIAGNOSIS — E663 Overweight: Secondary | ICD-10-CM | POA: Diagnosis not present

## 2024-06-01 DIAGNOSIS — Z6827 Body mass index (BMI) 27.0-27.9, adult: Secondary | ICD-10-CM

## 2024-06-01 DIAGNOSIS — M5442 Lumbago with sciatica, left side: Secondary | ICD-10-CM

## 2024-06-01 DIAGNOSIS — E7800 Pure hypercholesterolemia, unspecified: Secondary | ICD-10-CM

## 2024-06-01 DIAGNOSIS — E039 Hypothyroidism, unspecified: Secondary | ICD-10-CM

## 2024-06-01 DIAGNOSIS — G8929 Other chronic pain: Secondary | ICD-10-CM

## 2024-06-01 MED ORDER — QULIPTA 10 MG PO TABS: 10.0000 mg | ORAL_TABLET | Freq: Every day | ORAL | 1 refills | Status: AC

## 2024-06-01 MED ORDER — SEMAGLUTIDE-WEIGHT MANAGEMENT 1.7 MG/0.75ML ~~LOC~~ SOAJ: 1.7000 mg | SUBCUTANEOUS | 0 refills | Status: DC

## 2024-06-01 NOTE — Assessment & Plan Note (Signed)
 Lipid profile today Encouraged her to consume a low fat diet

## 2024-06-01 NOTE — Assessment & Plan Note (Signed)
 TSH and free T4 today We will adjust levothyroxine  100 mcg if needed based on labs

## 2024-06-01 NOTE — Assessment & Plan Note (Signed)
 Encouraged regular stretching and core strengthening Continue methocarbamol  500 mg daily as needed She will continue to follow-up with neurosurgery

## 2024-06-01 NOTE — Progress Notes (Signed)
 Subjective:    Patient ID: Danielle Morgan, female    DOB: 10/09/1995, 28 y.o.   MRN: [id number]  HPI  Patient presents to clinic today for 44-month follow-up of chronic conditions.  Hypothyroidism: She denies any issues on her current dose of levothyroxine .  She does not follow with endocrinology.  Migraines: These occur about 1-2  x month.  Triggered by her menstrual cycle.  She takes qulipta  as prescribed and methocarbamol  as needed with good relief of symptoms.  She does not follow with neurology.  HLD: Her last LDL was 113, triglycerides 64, 11/2023.  She is not taking any cholesterol-lowering medication at this time.  She tries to consume a low-fat diet.  Sciatica: Left side. S/p injections. Managed with methocarbamol  as needed with some relief of symptoms.  MRI from 06/2022 reviewed.  She follows with neurosurgery.   Review of Systems     Past Medical History:  Diagnosis Date   Frequent headaches    Thyroid  disease     Current Outpatient Medications  Medication Sig Dispense Refill   amoxicillin -clavulanate (AUGMENTIN ) 875-125 MG tablet Take 1 tablet by mouth 2 (two) times daily. 20 tablet 0   Atogepant  (QULIPTA ) 10 MG TABS Take 10 mg by mouth daily. 90 tablet 1   fluconazole  (DIFLUCAN ) 150 MG tablet Take 1 tablet (150 mg total) by mouth every three (3) days as needed. 2 tablet 0   levothyroxine  (SYNTHROID ) 100 MCG tablet TAKE 1 TABLET BY MOUTH EVERY DAY 90 tablet 1   methocarbamol  (ROBAXIN ) 500 MG tablet Take 1 tablet (500 mg total) by mouth every 8 (eight) hours as needed for muscle spasms. 60 tablet 0   ondansetron  (ZOFRAN -ODT) 4 MG disintegrating tablet Take 1 tablet (4 mg total) by mouth every 8 (eight) hours as needed for nausea or vomiting. 30 tablet 0   Semaglutide -Weight Management 1 MG/0.5ML SOAJ Inject 1 mg into the skin once a week. 6 mL 0   No current facility-administered medications for this visit.    No Known Allergies  Family History  Problem Relation  Age of Onset   Arthritis Mother    Bipolar disorder Father    ADD / ADHD Sister    ADD / ADHD Brother    Arthritis Maternal Grandfather    Hypertension Maternal Grandfather    Alcohol abuse Maternal Grandfather    Lung cancer Paternal Grandmother    Heart disease Maternal Uncle     Social History   Socioeconomic History   Marital status: Married    Spouse name: Not on file   Number of children: Not on file   Years of education: Not on file   Highest education level: Not on file  Occupational History   Not on file  Tobacco Use   Smoking status: Never   Smokeless tobacco: Never  Vaping Use   Vaping status: Never Used  Substance and Sexual Activity   Alcohol use: Yes    Comment: occasionally   Drug use: No   Sexual activity: Yes    Partners: Male    Birth control/protection: I.U.D.  Other Topics Concern   Not on file  Social History Narrative   Not on file   Social Drivers of Health   Financial Resource Strain: Not on file  Food Insecurity: Not on file  Transportation Needs: Not on file  Physical Activity: Not on file  Stress: Not on file  Social Connections: Not on file  Intimate Partner Violence: Not on file  Constitutional: Patient reports intermittent headaches.  Denies fever, malaise, fatigue, or abrupt weight changes.  HEENT: Denies eye pain, eye redness, ear pain, ringing in the ears, wax buildup, runny nose, nasal congestion, bloody nose, or sore throat. Respiratory: Denies difficulty breathing, shortness of breath, cough or sputum production.   Cardiovascular: Denies chest pain, chest tightness, palpitations or swelling in the hands or feet.  Gastrointestinal: Denies abdominal pain, bloating, constipation, diarrhea or blood in the stool.  GU: Denies urgency, frequency, pain with urination, burning sensation, blood in urine, odor or discharge. Musculoskeletal: Patient reports intermittent low back pain.  Denies decrease in range of motion, difficulty  with gait, or joint swelling.  Skin: Denies redness, rashes, lesions or ulcercations.  Neurological: Denies dizziness, difficulty with memory, difficulty with speech or problems with balance and coordination.  Psych: Denies anxiety, depression, SI/HI.  No other specific complaints in a complete review of systems (except as listed in HPI above).  Objective:   Physical Exam  BP 108/68 (BP Location: Left Arm, Patient Position: Sitting, Cuff Size: Normal)   Ht 5' 5 (1.651 m)   Wt 167 lb (75.8 kg)   LMP 05/09/2024 (Exact Date)   BMI 27.79 kg/m    Wt Readings from Last 3 Encounters:  02/12/24 182 lb 3 oz (82.6 kg)  01/16/24 181 lb 3.2 oz (82.2 kg)  11/29/23 184 lb 6.4 oz (83.6 kg)    General: Appears her stated age, obese, in NAD. HEENT: Head: normal shape and size; Eyes: sclera white, no icterus, conjunctiva pink, PERRLA and EOMs intact;  Neck:  Neck supple, trachea midline. No masses, lumps or thyromegaly present.  Cardiovascular: Normal rate and rhythm. S1,S2 noted.  No murmur, rubs or gallops noted.  Pulmonary/Chest: Normal effort and positive vesicular breath sounds. No respiratory distress. No wheezes, rales or ronchi noted.  Musculoskeletal:   No difficulty with gait.  Neurological: Alert and oriented.  Coordination normal.    BMET    Component Value Date/Time   NA 138 11/29/2023 1114   NA 138 11/29/2023 1114   NA 137 10/23/2018 1044   K 4.0 11/29/2023 1114   K 4.0 11/29/2023 1114   CL 105 11/29/2023 1114   CL 105 11/29/2023 1114   CO2 26 11/29/2023 1114   CO2 26 11/29/2023 1114   GLUCOSE 77 11/29/2023 1114   GLUCOSE 77 11/29/2023 1114   BUN 8 11/29/2023 1114   BUN 8 11/29/2023 1114   BUN 10 10/23/2018 1044   CREATININE 0.55 11/29/2023 1114   CREATININE 0.55 11/29/2023 1114   CALCIUM 9.4 11/29/2023 1114   CALCIUM 9.4 11/29/2023 1114   GFRNONAA >60 11/13/2022 0808   GFRAA >60 01/13/2020 1826    Lipid Panel     Component Value Date/Time   CHOL 173  11/29/2023 1114   CHOL 167 10/23/2018 1044   TRIG 64 11/29/2023 1114   HDL 45 (L) 11/29/2023 1114   HDL 52 10/23/2018 1044   CHOLHDL 3.8 11/29/2023 1114   VLDL 34.0 12/30/2019 1510   LDLCALC 113 (H) 11/29/2023 1114    CBC    Component Value Date/Time   WBC 6.3 11/29/2023 1114   RBC 4.45 11/29/2023 1114   HGB 13.5 11/29/2023 1114   HGB 11.3 10/28/2019 0823   HCT 40.8 11/29/2023 1114   HCT 34.6 10/28/2019 0823   PLT 263 11/29/2023 1114   PLT 242 10/28/2019 0823   MCV 91.7 11/29/2023 1114   MCV 95 10/28/2019 0823   MCH 30.3 11/29/2023 1114  MCHC 33.1 11/29/2023 1114   RDW 12.7 11/29/2023 1114   RDW 11.6 (L) 10/28/2019 0823   LYMPHSABS 1.8 07/07/2019 1535   MONOABS 0.9 04/26/2015 1053   EOSABS 0.1 07/07/2019 1535   BASOSABS 0.0 07/07/2019 1535    Hgb A1C Lab Results  Component Value Date   HGBA1C 5.1 11/29/2023           Assessment & Plan:      RTC in 6 months, for your annual exam Angeline Laura, NP

## 2024-06-01 NOTE — Assessment & Plan Note (Signed)
 Not currently on hormonal therapy but this may be something to consider in the future as her migraines seem to be triggered by hormones/menses Continue qulipta  10 mg daily and methocarbamol  500 mg daily prn

## 2024-06-01 NOTE — Assessment & Plan Note (Addendum)
 Encouraged diet and exercise for weight loss Increase wegovy  to 1.7 mg weekly

## 2024-06-01 NOTE — Patient Instructions (Signed)

## 2024-06-02 ENCOUNTER — Ambulatory Visit: Payer: Self-pay | Admitting: Internal Medicine

## 2024-06-02 ENCOUNTER — Other Ambulatory Visit (HOSPITAL_COMMUNITY): Payer: Self-pay

## 2024-06-02 ENCOUNTER — Telehealth: Payer: Self-pay

## 2024-06-02 LAB — TSH: TSH: 0.83 m[IU]/L

## 2024-06-02 LAB — LIPID PANEL
Cholesterol: 140 mg/dL (ref <200)
HDL: 44 mg/dL — ABNORMAL LOW (ref >=50)
LDL Cholesterol (Calc): 82 mg/dL
Non-HDL Cholesterol (Calc): 96 mg/dL (ref <130)
Total CHOL/HDL Ratio: 3.2 (calc) (ref <5.0)
Triglycerides: 65 mg/dL (ref <150)

## 2024-06-02 LAB — T4, FREE: Free T4: 1.6 ng/dL (ref 0.8–1.8)

## 2024-06-02 NOTE — Telephone Encounter (Signed)
 Pharmacy Patient Advocate Encounter   Received notification from Onbase that prior authorization for Wegovy  1.7MG /0.75ML auto-injectors is required/requested.   Insurance verification completed.   The patient is insured through [facility] .   Per test claim: PA required; PA submitted to above mentioned insurance via Latent Key/confirmation #/EOC [id number] Status is pending

## 2024-06-08 ENCOUNTER — Other Ambulatory Visit (HOSPITAL_COMMUNITY): Payer: Self-pay

## 2024-06-08 NOTE — Telephone Encounter (Signed)
 Additional information has been requested from the patient's insurance in order to proceed with the prior authorization request. Requested information has been sent, or form has been filled out and faxed back to [place] [employer] [place] at [phone]  Case Key Number: [id number]

## 2024-06-09 NOTE — Telephone Encounter (Signed)
 Pharmacy Patient Advocate Encounter  Received notification from [facility] that Prior Authorization for Wegovy  1.7mg /0.49ml auto-injectors has been APPROVED from 06/02/2024 to 06/02/2025.   PA #/Case ID/Reference #: [id number]

## 2024-06-15 ENCOUNTER — Encounter: Payer: Self-pay | Admitting: Internal Medicine

## 2024-06-15 NOTE — Telephone Encounter (Signed)
 Please read Prior Auth encounter in pt's chart, PA has been approved since the 23rd, thank you.

## 2024-06-25 ENCOUNTER — Encounter: Payer: Self-pay | Admitting: Internal Medicine

## 2024-06-25 MED ORDER — ONDANSETRON 4 MG PO TBDP: 4.0000 mg | ORAL_TABLET | Freq: Three times a day (TID) | ORAL | 0 refills | Status: AC | PRN

## 2024-07-22 ENCOUNTER — Encounter: Payer: Self-pay | Admitting: Internal Medicine

## 2024-07-22 ENCOUNTER — Ambulatory Visit: Admitting: Internal Medicine

## 2024-07-22 VITALS — BP 104/68 | Ht 65.0 in | Wt 161.6 lb

## 2024-07-22 DIAGNOSIS — E559 Vitamin D deficiency, unspecified: Secondary | ICD-10-CM

## 2024-07-22 DIAGNOSIS — R5383 Other fatigue: Secondary | ICD-10-CM

## 2024-07-22 DIAGNOSIS — E039 Hypothyroidism, unspecified: Secondary | ICD-10-CM

## 2024-07-22 DIAGNOSIS — E538 Deficiency of other specified B group vitamins: Secondary | ICD-10-CM

## 2024-07-22 DIAGNOSIS — G479 Sleep disorder, unspecified: Secondary | ICD-10-CM

## 2024-07-22 DIAGNOSIS — N926 Irregular menstruation, unspecified: Secondary | ICD-10-CM

## 2024-07-22 DIAGNOSIS — R42 Dizziness and giddiness: Secondary | ICD-10-CM | POA: Diagnosis not present

## 2024-07-22 NOTE — Progress Notes (Unsigned)
 Subjective:    Patient ID: Danielle Morgan, female    DOB: August 03, 1996, 28 y.o.   MRN: [id number]  HPI  Discussed the use of AI scribe software for clinical note transcription with the patient, who gave verbal consent to proceed.  Danielle Morgan is a 28 year old female who presents with lightheadedness, memory issues, and fatigue.  She has been experiencing significant lightheadedness over the past couple of weeks, particularly when standing up, which makes her feel as though she might pass out.  She denies any actual syncopal episodes.  She describes issues with memory, including difficulty recalling conversations and events, which she feels has been progressively worsening over time. Her husband has pointed out discrepancies in her memory of conversations.  She experiences severe fatigue, describing a 'midday crash' that necessitates a nap to get through the day. Her sleep is disrupted, waking multiple times throughout the night, and she does not feel rested upon waking. She is unsure if she snores and mentions that her mood was affected last month, but currently feels fine. She attributes some irritability to her fatigue.  Her menstrual cycle, which has been regular since receiving an IUD five years ago, was irregular last month, with an unusual pattern of bleeding.     She has a history of hypothyroidism and has been taking her levothyroxine  as prescribed.  She does feel like she has noticed some of the symptoms, specifically lightheadedness since increasing her her dose of Wegovy .   Review of Systems     Past Medical History:  Diagnosis Date   Frequent headaches    Thyroid  disease     Current Outpatient Medications  Medication Sig Dispense Refill   Atogepant  (QULIPTA ) 10 MG TABS Take 10 mg by mouth daily. 90 tablet 1   levothyroxine  (SYNTHROID ) 100 MCG tablet TAKE 1 TABLET BY MOUTH EVERY DAY 90 tablet 1   methocarbamol  (ROBAXIN ) 500 MG tablet Take 1 tablet (500 mg total) by mouth  every 8 (eight) hours as needed for muscle spasms. 60 tablet 0   ondansetron  (ZOFRAN -ODT) 4 MG disintegrating tablet Take 1 tablet (4 mg total) by mouth every 8 (eight) hours as needed for nausea or vomiting. 30 tablet 0   semaglutide -weight management (WEGOVY ) 1.7 MG/0.75ML SOAJ SQ injection Inject 1.7 mg into the skin once a week. 6 mL 0   No current facility-administered medications for this visit.    No Known Allergies  Family History  Problem Relation Age of Onset   Arthritis Mother    Bipolar disorder Father    ADD / ADHD Sister    ADD / ADHD Brother    Arthritis Maternal Grandfather    Hypertension Maternal Grandfather    Alcohol abuse Maternal Grandfather    Lung cancer Paternal Grandmother    Heart disease Maternal Uncle     Social History   Socioeconomic History   Marital status: Married    Spouse name: Not on file   Number of children: Not on file   Years of education: Not on file   Highest education level: Not on file  Occupational History   Not on file  Tobacco Use   Smoking status: Never   Smokeless tobacco: Never  Vaping Use   Vaping status: Never Used  Substance and Sexual Activity   Alcohol use: Yes    Comment: occasionally   Drug use: No   Sexual activity: Yes    Partners: Male    Birth control/protection: I.U.D.  Other Topics Concern  Not on file  Social History Narrative   Not on file   Social Drivers of Health   Financial Resource Strain: Not on file  Food Insecurity: Not on file  Transportation Needs: Not on file  Physical Activity: Not on file  Stress: Not on file  Social Connections: Not on file  Intimate Partner Violence: Not on file     Constitutional: Patient reports intermittent headaches, fatigue.  Denies fever, malaise, or abrupt weight changes.  HEENT: Denies eye pain, eye redness, ear pain, ringing in the ears, wax buildup, runny nose, nasal congestion, bloody nose, or sore throat. Respiratory: Denies difficulty  breathing, shortness of breath, cough or sputum production.   Cardiovascular: Denies chest pain, chest tightness, palpitations or swelling in the hands or feet.  Gastrointestinal: Denies abdominal pain, bloating, constipation, diarrhea or blood in the stool.  GU: Patient reports irregular menses.  Denies urgency, frequency, pain with urination, burning sensation, blood in urine, odor or discharge. Musculoskeletal: Patient reports intermittent low back pain.  Denies decrease in range of motion, difficulty with gait, or joint swelling.  Skin: Denies redness, rashes, lesions or ulcercations.  Neurological: Patient reports forgetfulness, lightheadedness.  Denies difficulty with memory, difficulty with speech or problems with balance and coordination.  Psych: Denies anxiety, depression, SI/HI.  No other specific complaints in a complete review of systems (except as listed in HPI above).  Objective:   Physical Exam  BP 104/68 (BP Location: Right Arm, Patient Position: Sitting, Cuff Size: Normal)   Ht 5' 5 (1.651 m)   Wt 161 lb 9.6 oz (73.3 kg)   LMP 07/04/2024 (Exact Date)   BMI 26.89 kg/m     Wt Readings from Last 3 Encounters:  06/01/24 167 lb (75.8 kg)  02/12/24 182 lb 3 oz (82.6 kg)  01/16/24 181 lb 3.2 oz (82.2 kg)    General: Appears her stated age, overweight, in NAD. HEENT: Head: normal shape and size; Eyes: sclera white, no icterus, conjunctiva pink, PERRLA and EOMs intact;  Neck:  Neck supple, trachea midline. No masses, lumps or thyromegaly present.  Cardiovascular: Normal rate and rhythm. S1,S2 noted.  No murmur, rubs or gallops noted.  Pulmonary/Chest: Normal effort and positive vesicular breath sounds. No respiratory distress. No wheezes, rales or ronchi noted.  Musculoskeletal:   No difficulty with gait.  Neurological: Alert and oriented.  Coordination normal.    BMET    Component Value Date/Time   NA 138 11/29/2023 1114   NA 138 11/29/2023 1114   NA 137  10/23/2018 1044   K 4.0 11/29/2023 1114   K 4.0 11/29/2023 1114   CL 105 11/29/2023 1114   CL 105 11/29/2023 1114   CO2 26 11/29/2023 1114   CO2 26 11/29/2023 1114   GLUCOSE 77 11/29/2023 1114   GLUCOSE 77 11/29/2023 1114   BUN 8 11/29/2023 1114   BUN 8 11/29/2023 1114   BUN 10 10/23/2018 1044   CREATININE 0.55 11/29/2023 1114   CREATININE 0.55 11/29/2023 1114   CALCIUM 9.4 11/29/2023 1114   CALCIUM 9.4 11/29/2023 1114   GFRNONAA >60 11/13/2022 0808   GFRAA >60 01/13/2020 1826    Lipid Panel     Component Value Date/Time   CHOL 140 06/01/2024 1350   CHOL 167 10/23/2018 1044   TRIG 65 06/01/2024 1350   HDL 44 (L) 06/01/2024 1350   HDL 52 10/23/2018 1044   CHOLHDL 3.2 06/01/2024 1350   VLDL 34.0 12/30/2019 1510   LDLCALC 82 06/01/2024 1350  CBC    Component Value Date/Time   WBC 6.3 11/29/2023 1114   RBC 4.45 11/29/2023 1114   HGB 13.5 11/29/2023 1114   HGB 11.3 10/28/2019 0823   HCT 40.8 11/29/2023 1114   HCT 34.6 10/28/2019 0823   PLT 263 11/29/2023 1114   PLT 242 10/28/2019 0823   MCV 91.7 11/29/2023 1114   MCV 95 10/28/2019 0823   MCH 30.3 11/29/2023 1114   MCHC 33.1 11/29/2023 1114   RDW 12.7 11/29/2023 1114   RDW 11.6 (L) 10/28/2019 0823   LYMPHSABS 1.8 07/07/2019 1535   MONOABS 0.9 04/26/2015 1053   EOSABS 0.1 07/07/2019 1535   BASOSABS 0.0 07/07/2019 1535    Hgb A1C Lab Results  Component Value Date   HGBA1C 5.1 11/29/2023           Assessment & Plan:   Assessment and Plan    Dizziness and lightheadedness Likely orthostatic hypotension, possibly from dehydration due to increased Wegovy  dose. Low blood pressure supports orthostatic hypotension. - Checked orthostatic blood pressures (laying down, sitting, standing) which were negative. - Ensure adequate hydration.  Fatigue and sleep disturbance Multifactorial, including poor sleep quality, possible dehydration, and medication side effects. Mood well-controlled, irritability may be due  to fatigue. - Ordered blood work including thyroid  levels, vitamin B12, and vitamin D. - Consider sleep aids if blood work is normal.  Irregular menstruation with levonorgestrel  IUD in situ Possibly due to hormonal changes as IUD nears end of effective period. Thyroid  dysfunction and stress also potential contributors. - Recommended follow-up with GYN for potential IUD replacement. - Ordered thyroid  function tests.  Hypothyroidism Symptoms of fatigue, irregular menstruation, and weight changes suggest thyroid  dysfunction. - Ordered thyroid  function tests.  Suspected vitamin D and B deficiency Potential contributors to fatigue and other symptoms. - Ordered vitamin B12 and vitamin D levels.       RTC in 4 months, for your annual exam Angeline Laura, NP

## 2024-07-22 NOTE — Patient Instructions (Signed)
 Dizziness Dizziness is a common problem. It makes you feel unsteady or light-headed. You may feel like you're about to faint. Dizziness can lead to getting hurt if you stumble or fall. It's more common to feel dizzy if you're an older adult. Many things can cause you to feel dizzy. These include: Medicines. Dehydration. This is when there's not enough water in your body. Illness. Follow these instructions at home: Eating and drinking  Drink enough fluid to keep your pee (urine) pale yellow. This helps keep you from getting dehydrated. Try to drink more clear fluids, such as water. Do not drink alcohol. Try to limit how much caffeine you take in. Try to limit how much salt, also called sodium, you take in. Activity Try not to make quick movements. Stand up slowly from sitting in a chair. Steady yourself until you feel okay. In the morning, first sit up on the side of the bed. When you feel okay, hold onto something and slowly stand up. Do this until you know that your balance is okay. If you need to stand in one place for a long time, move your legs often. Tighten and relax the muscles in your legs while you're standing. Do not drive or use machines if you feel dizzy. Avoid bending down if you feel dizzy. Place items in your home so you can reach them without leaning over. Lifestyle Do not smoke, vape, or use products with nicotine or tobacco in them. If you need help quitting, talk with your health care provider. Try to lower your stress level. You can do this by using methods like yoga or meditation. Talk with your provider if you need help. General instructions Watch your dizziness for any changes. Take your medicines only as told by your provider. Talk with your provider if you think you're dizzy because of a medicine you're taking. Tell a friend or a family member that you're feeling dizzy. If they spot any changes in your behavior, have them call your provider. Contact a health care  provider if: Your dizziness doesn't go away, or you have new symptoms. Your dizziness gets worse. You feel like you may vomit. You have trouble hearing. You have a fever. You have neck pain or a stiff neck. You fall or get hurt. Get help right away if: You vomit each time you eat or drink. You have watery poop and can't eat or drink. You have trouble talking, walking, swallowing, or using your arms, hands, or legs. You feel very weak. You're bleeding. You're not thinking clearly, or you have trouble forming sentences. A friend or family member may spot this. Your vision changes, or you get a very bad headache. These symptoms may be an emergency. Call 911 right away. Do not wait to see if the symptoms will go away. Do not drive yourself to the hospital. This information is not intended to replace advice given to you by your health care provider. Make sure you discuss any questions you have with your health care provider. Document Revised: 06/06/2023 Document Reviewed: 10/18/2022 Elsevier Patient Education  2024 [employer].

## 2024-07-23 ENCOUNTER — Ambulatory Visit: Payer: Self-pay | Admitting: Internal Medicine

## 2024-07-23 LAB — COMPREHENSIVE METABOLIC PANEL WITH GFR
AG Ratio: 1.8 (calc) (ref 1.0–2.5)
ALT: 11 U/L (ref 6–29)
AST: 19 U/L (ref 10–30)
Albumin: 4.5 g/dL (ref 3.6–5.1)
Alkaline phosphatase (APISO): 56 U/L (ref 31–125)
BUN: 9 mg/dL (ref 7–25)
CO2: 26 mmol/L (ref 20–32)
Calcium: 9.1 mg/dL (ref 8.6–10.2)
Chloride: 102 mmol/L (ref 98–110)
Creat: 0.6 mg/dL (ref 0.50–0.96)
Globulin: 2.5 g/dL (ref 1.9–3.7)
Glucose, Bld: 76 mg/dL (ref 65–99)
Potassium: 4.2 mmol/L (ref 3.5–5.3)
Sodium: 136 mmol/L (ref 135–146)
Total Bilirubin: 0.6 mg/dL (ref 0.2–1.2)
Total Protein: 7 g/dL (ref 6.1–8.1)
eGFR: 125 mL/min/1.73m2 (ref >=60)

## 2024-07-23 LAB — VITAMIN D 25 HYDROXY (VIT D DEFICIENCY, FRACTURES): Vit D, 25-Hydroxy: 30 ng/mL (ref 30–100)

## 2024-07-23 LAB — VITAMIN B12: Vitamin B-12: 545 pg/mL (ref 200–1100)

## 2024-07-23 LAB — TSH: TSH: 1.03 m[IU]/L

## 2024-07-23 LAB — T4, FREE: Free T4: 1.4 ng/dL (ref 0.8–1.8)

## 2024-07-30 MED ORDER — TRAZODONE HCL 50 MG PO TABS: 25.0000 mg | ORAL_TABLET | Freq: Every evening | ORAL | 0 refills | Status: AC | PRN

## 2024-09-04 DIAGNOSIS — F4323 Adjustment disorder with mixed anxiety and depressed mood: Secondary | ICD-10-CM | POA: Diagnosis not present

## 2024-09-07 ENCOUNTER — Other Ambulatory Visit: Payer: Self-pay | Admitting: Internal Medicine

## 2024-09-09 NOTE — Telephone Encounter (Signed)
 Requested Prescriptions  Pending Prescriptions Disp Refills   WEGOVY  1.7 MG/0.75ML SOAJ SQ injection [Pharmacy Med Name: WEGOVY  1.7 MG/0.75 ML PEN] 6 mL 0    Sig: INJECT 1.7 MG INTO THE SKIN ONCE A WEEK.     Endocrinology:  Diabetes - GLP-1 Receptor Agonists - semaglutide  Failed - 09/09/2024 12:06 PM      Failed - HBA1C in normal range and within 180 days    Hgb A1c MFr Bld  Date Value Ref Range Status  11/29/2023 5.1 <5.7 % of total Hgb Final    Comment:    For the purpose of screening for the presence of diabetes: . <5.7%       Consistent with the absence of diabetes 5.7-6.4%    Consistent with increased risk for diabetes             (prediabetes) > or =6.5%  Consistent with diabetes . This assay result is consistent with a decreased risk of diabetes. . Currently, no consensus exists regarding use of hemoglobin A1c for diagnosis of diabetes in children. . According to American Diabetes Association (ADA) guidelines, hemoglobin A1c <7.0% represents optimal control in non-pregnant diabetic patients. Different metrics may apply to specific patient populations.  Standards of Medical Care in Diabetes(ADA). .          Passed - Cr in normal range and within 360 days    Creat  Date Value Ref Range Status  07/22/2024 0.60 0.50 - 0.96 mg/dL Final   Creatinine, Urine  Date Value Ref Range Status  01/13/2020 174.73 mg/dL Final         Passed - Valid encounter within last 6 months    Recent Outpatient Visits           1 month ago Lightheadedness   [place] [facility] [place], Angeline ORN, NP   3 months ago Pure hypercholesterolemia   [place] [facility] [place], Angeline ORN, NP   7 months ago Strep throat   [place] [facility] [place], [place] W, NP   7 months ago Chronic migraine without aura with status migrainosus, not intractable   [place] [facility] [place], Angeline ORN, NP   7 months ago Strep throat    [place] [place], Angeline ORN, [place]

## 2024-09-22 ENCOUNTER — Other Ambulatory Visit: Payer: Self-pay | Admitting: Internal Medicine

## 2024-09-24 NOTE — Telephone Encounter (Signed)
 Requested Prescriptions  Pending Prescriptions Disp Refills   levothyroxine  (SYNTHROID ) 100 MCG tablet [Pharmacy Med Name: LEVOTHYROXINE  100 MCG TABLET] 90 tablet 0    Sig: TAKE 1 TABLET BY MOUTH EVERY DAY     Endocrinology:  Hypothyroid Agents Passed - 09/24/2024 11:29 AM      Passed - TSH in normal range and within 360 days    TSH  Date Value Ref Range Status  07/22/2024 1.03 mIU/L Final    Comment:              Reference Range .           > or = 20 Years  0.40-4.50 .                Pregnancy Ranges           First trimester    0.26-2.66           Second trimester   0.55-2.73           Third trimester    0.43-2.91          Passed - Valid encounter within last 12 months    Recent Outpatient Visits           2 months ago Lightheadedness   [place] [facility] [place], Angeline ORN, NP   3 months ago Pure hypercholesterolemia   [facility] [place], Angeline ORN, NP   7 months ago Strep throat   [place] [facility] [place], [place] W, NP   8 months ago Chronic migraine without aura with status migrainosus, not intractable   [place] [facility] [place], Angeline ORN, NP   8 months ago Strep throat   [place] [facility] [place], Angeline ORN, [place]

## 2024-12-01 ENCOUNTER — Ambulatory Visit: Admitting: Internal Medicine
